# Patient Record
Sex: Female | Born: 1960 | Race: Black or African American | Hispanic: No | Marital: Single | Smoking: Never smoker
Health system: Southern US, Community
[De-identification: ages and names within clinical notes are randomized; demographics above are authoritative.]

## PROBLEM LIST (undated history)

## (undated) DIAGNOSIS — D649 Anemia, unspecified: Secondary | ICD-10-CM

## (undated) DIAGNOSIS — T7840XA Allergy, unspecified, initial encounter: Secondary | ICD-10-CM

## (undated) DIAGNOSIS — M109 Gout, unspecified: Secondary | ICD-10-CM

## (undated) DIAGNOSIS — R609 Edema, unspecified: Secondary | ICD-10-CM

## (undated) DIAGNOSIS — M2550 Pain in unspecified joint: Secondary | ICD-10-CM

## (undated) DIAGNOSIS — R011 Cardiac murmur, unspecified: Secondary | ICD-10-CM

## (undated) DIAGNOSIS — I10 Essential (primary) hypertension: Secondary | ICD-10-CM

## (undated) HISTORY — DX: Cardiac murmur, unspecified: R01.1

## (undated) HISTORY — DX: Anemia, unspecified: D64.9

## (undated) HISTORY — DX: Gout, unspecified: M10.9

## (undated) HISTORY — DX: Essential (primary) hypertension: I10

## (undated) HISTORY — PX: OVARIAN CYST REMOVAL: SHX89

## (undated) HISTORY — DX: Edema, unspecified: R60.9

## (undated) HISTORY — DX: Allergy, unspecified, initial encounter: T78.40XA

## (undated) HISTORY — PX: TUBAL LIGATION: SHX77

## (undated) HISTORY — DX: Pain in unspecified joint: M25.50

---

## 2011-10-03 DIAGNOSIS — E876 Hypokalemia: Secondary | ICD-10-CM

## 2011-10-03 HISTORY — DX: Hypokalemia: E87.6

## 2011-10-03 HISTORY — DX: Morbid (severe) obesity due to excess calories: E66.01

## 2011-10-08 DIAGNOSIS — D649 Anemia, unspecified: Secondary | ICD-10-CM | POA: Insufficient documentation

## 2011-11-09 ENCOUNTER — Other Ambulatory Visit: Payer: Self-pay | Admitting: Family Medicine

## 2011-11-09 DIAGNOSIS — Z1231 Encounter for screening mammogram for malignant neoplasm of breast: Secondary | ICD-10-CM

## 2011-11-12 DIAGNOSIS — E559 Vitamin D deficiency, unspecified: Secondary | ICD-10-CM

## 2011-11-12 HISTORY — DX: Vitamin D deficiency, unspecified: E55.9

## 2011-11-28 ENCOUNTER — Other Ambulatory Visit: Payer: Self-pay

## 2011-11-28 ENCOUNTER — Ambulatory Visit: Payer: Self-pay

## 2012-04-02 ENCOUNTER — Ambulatory Visit (INDEPENDENT_AMBULATORY_CARE_PROVIDER_SITE_OTHER): Payer: BC Managed Care – PPO | Admitting: Family Medicine

## 2012-04-02 ENCOUNTER — Ambulatory Visit: Payer: BC Managed Care – PPO

## 2012-04-02 VITALS — BP 148/96 | HR 59 | Temp 98.0°F | Resp 16 | Ht 65.0 in | Wt 239.6 lb

## 2012-04-02 DIAGNOSIS — M79673 Pain in unspecified foot: Secondary | ICD-10-CM

## 2012-04-02 DIAGNOSIS — M25579 Pain in unspecified ankle and joints of unspecified foot: Secondary | ICD-10-CM

## 2012-04-02 DIAGNOSIS — M542 Cervicalgia: Secondary | ICD-10-CM

## 2012-04-02 DIAGNOSIS — M79609 Pain in unspecified limb: Secondary | ICD-10-CM

## 2012-04-02 MED ORDER — TRAMADOL HCL 50 MG PO TABS: 50.0000 mg | ORAL_TABLET | Freq: Four times a day (QID) | ORAL | Status: AC | PRN

## 2012-04-02 MED ORDER — CYCLOBENZAPRINE HCL 5 MG PO TABS: ORAL_TABLET | ORAL | Status: AC
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
## 2012-04-02 NOTE — Patient Instructions (Signed)
Post op shoe as needed for next 1-2 weeks.  Recheck if foot and neck not improving.   Return to the clinic or go to the nearest emergency room if any of your symptoms worsen or new symptoms occur.

## 2012-06-24 ENCOUNTER — Ambulatory Visit (INDEPENDENT_AMBULATORY_CARE_PROVIDER_SITE_OTHER): Payer: BC Managed Care – PPO | Admitting: Family Medicine

## 2012-06-24 VITALS — BP 140/90 | HR 57 | Temp 98.5°F | Resp 16 | Ht 65.0 in | Wt 236.2 lb

## 2012-06-24 DIAGNOSIS — L259 Unspecified contact dermatitis, unspecified cause: Secondary | ICD-10-CM

## 2012-06-24 MED ORDER — TRIAMCINOLONE ACETONIDE 0.1 % EX CREA: TOPICAL_CREAM | Freq: Three times a day (TID) | CUTANEOUS | Status: DC

## 2012-06-24 MED ORDER — METHYLPREDNISOLONE ACETATE 80 MG/ML IJ SUSP
80.0000 mg | Freq: Once | INTRAMUSCULAR | Status: AC
  Administered 2012-06-24: 80 mg via INTRAMUSCULAR

## 2012-06-24 NOTE — Patient Instructions (Signed)
Use the cream 2-3 times a day on the rash.  Wash well with plenty of soap and water.  Take Zyrtec once or twice daily for the itching. It is much longer lasting than the Benadryl.

## 2012-06-24 NOTE — Progress Notes (Signed)
Subjective: 51 year old lady with history of having had a peppers yesterday. There relief chief the bag, and she wonders if it could have been poison ivy. Today she is broken out around her neck with a itching burning rash.   Objective: Maculopapular erythematous dermatitis around her neck. None elsewhere.  Assessment: Contact dermatitis  Plan: Depo-Medrol 80 IM Triamcinolone cream 3 times a day

## 2012-07-07 ENCOUNTER — Telehealth: Payer: Self-pay | Admitting: Radiology

## 2012-07-07 ENCOUNTER — Ambulatory Visit (INDEPENDENT_AMBULATORY_CARE_PROVIDER_SITE_OTHER): Payer: BC Managed Care – PPO | Admitting: Family Medicine

## 2012-07-07 VITALS — BP 164/92 | HR 73 | Temp 98.1°F | Resp 18 | Ht 64.75 in | Wt 235.0 lb

## 2012-07-07 DIAGNOSIS — L259 Unspecified contact dermatitis, unspecified cause: Secondary | ICD-10-CM

## 2012-07-07 DIAGNOSIS — I10 Essential (primary) hypertension: Secondary | ICD-10-CM

## 2012-07-07 DIAGNOSIS — Z131 Encounter for screening for diabetes mellitus: Secondary | ICD-10-CM

## 2012-07-07 LAB — GLUCOSE, POCT (MANUAL RESULT ENTRY): POC Glucose: 96 mg/dl (ref 70–99)

## 2012-07-07 MED ORDER — TRIAMCINOLONE ACETONIDE 0.1 % EX CREA: TOPICAL_CREAM | Freq: Three times a day (TID) | CUTANEOUS | Status: AC | PRN

## 2012-07-07 MED ORDER — PREDNISONE 20 MG PO TABS: ORAL_TABLET | ORAL | Status: DC

## 2012-07-07 MED ORDER — DIPHENHYDRAMINE HCL 25 MG PO CAPS: 25.0000 mg | ORAL_CAPSULE | Freq: Once | ORAL | Status: DC

## 2012-07-07 MED ORDER — DIPHENHYDRAMINE HCL 25 MG PO CAPS
25.0000 mg | ORAL_CAPSULE | Freq: Once | ORAL | Status: AC
  Administered 2012-07-07: 25 mg via ORAL

## 2012-07-07 NOTE — Telephone Encounter (Signed)
I have left message for patient to call, before she gets the prednisone Dr Neva Seat wants to make sure she has had her glucose level checked recently, if not she needs to come back here for this. She must have this done prior to starting the prednisone. The prednisone Rx is at [facility] desk and Dr Neva Seat does not want it sent in until we know about stats of this.

## 2012-07-07 NOTE — Addendum Note (Signed)
Addended by: Fernande Bras on: 07/07/2012 07:05 PM   Modules accepted: Orders

## 2012-07-07 NOTE — Patient Instructions (Signed)
Start prednisone.  Can take benadryl tonight (you were given one of these in the office today), but as itching improves - can change to zyrtec - one per day.  Ok to use steroid cream on areas next 1-2 days, but should not need this once the pills start working. If not improving in 3-4 days, can recheck in office.  Keep a record of your blood pressures outside of the office and bring them to the next office visit. Follow up soon if blood pressure remains over 140/90. Marland Kitchen Return to the clinic or go to the nearest emergency room if any of your symptoms worsen or new symptoms occur.     Contact Dermatitis Contact dermatitis is a reaction to certain substances that touch the skin. Contact dermatitis can be either irritant contact dermatitis or allergic contact dermatitis. Irritant contact dermatitis does not require previous exposure to the substance for a reaction to occur.Allergic contact dermatitis only occurs if you have been exposed to the substance before. Upon a repeat exposure, your body reacts to the substance.  CAUSES  Many substances can cause contact dermatitis. Irritant dermatitis is most commonly caused by repeated exposure to mildly irritating substances, such as:  Makeup.   Soaps.   Detergents.   Bleaches.   Acids.   Metal salts, such as nickel.  Allergic contact dermatitis is most commonly caused by exposure to:  Poisonous plants.   Chemicals (deodorants, shampoos).   Jewelry.   Latex.   Neomycin in triple antibiotic cream.   Preservatives in products, including clothing.  SYMPTOMS  The area of skin that is exposed may develop:  Dryness or flaking.   Redness.   Cracks.   Itching.   Pain or a burning sensation.   Blisters.  With allergic contact dermatitis, there may also be swelling in areas such as the eyelids, mouth, or genitals.  DIAGNOSIS  Your caregiver can usually tell what the problem is by doing a physical exam. In cases where the cause is  uncertain and an allergic contact dermatitis is suspected, a patch skin test may be performed to help determine the cause of your dermatitis. TREATMENT Treatment includes protecting the skin from further contact with the irritating substance by avoiding that substance if possible. Barrier creams, powders, and gloves may be helpful. Your caregiver may also recommend:  Steroid creams or ointments applied 2 times daily. For best results, soak the rash area in cool water for 20 minutes. Then apply the medicine. Cover the area with a plastic wrap. You can store the steroid cream in the refrigerator for a "chilly" effect on your rash. That may decrease itching. Oral steroid medicines may be needed in more severe cases.   Antibiotics or antibacterial ointments if a skin infection is present.   Antihistamine lotion or an antihistamine taken by mouth to ease itching.   Lubricants to keep moisture in your skin.   Burow's solution to reduce redness and soreness or to dry a weeping rash. Mix one packet or tablet of solution in 2 cups cool water. Dip a clean washcloth in the mixture, wring it out a bit, and put it on the affected area. Leave the cloth in place for 30 minutes. Do this as often as possible throughout the day.   Taking several cornstarch or baking soda baths daily if the area is too large to cover with a washcloth.  Harsh chemicals, such as alkalis or acids, can cause skin damage that is like a burn. You should flush your  skin for 15 to 20 minutes with cold water after such an exposure. You should also seek immediate medical care after exposure. Bandages (dressings), antibiotics, and pain medicine may be needed for severely irritated skin.  HOME CARE INSTRUCTIONS  Avoid the substance that caused your reaction.   Keep the area of skin that is affected away from hot water, soap, sunlight, chemicals, acidic substances, or anything else that would irritate your skin.   Do not scratch the rash.  Scratching may cause the rash to become infected.   You may take cool baths to help stop the itching.   Only take over-the-counter or prescription medicines as directed by your caregiver.   See your caregiver for follow-up care as directed to make sure your skin is healing properly.  SEEK MEDICAL CARE IF:   Your condition is not better after 3 days of treatment.   You seem to be getting worse.   You see signs of infection such as swelling, tenderness, redness, soreness, or warmth in the affected area.   You have any problems related to your medicines.  Document Released: 10/26/2000 Document Revised: 10/18/2011 Document Reviewed: 04/03/2011 [facility] Patient Information 2012 [place], [place].

## 2012-07-07 NOTE — Telephone Encounter (Signed)
Patient returned, unhappy that her prednisone was not at the pharmacy (she waited there for an hour and had not received our message).  I gave her the prescription, and then received this message.  She came in for the test.  Glucose 93.

## 2012-07-07 NOTE — Progress Notes (Signed)
Subjective:    Patient ID: Candice [name], female    DOB: October 04, 1961, 51 y.o.   MRN: [id number]  HPI Candice [name] is a 51 y.o. female Treated 06/24/12 with depomedrol 80mg  IM, and tac cream tid for contact dermatitis to neck. Front of neck then -resolved, but then rash on shoulders (now clear), then face - past few days.  Itches by eyes. Also on lower arms. Very itchy.   Picked up leaf when had green peppers at work prior to last office visit - unknown leaf, but rash started that night.   Tx: tac cream - 2-3 times per day.  Helped when using on shoulders - ran out of cream last night.  Calamine cream - last night.   No fevers. Otherwise feels ok.  No new meds/supplements/soaps/lotions.  No oral or mucosal lesions.   Review of Systems  Constitutional: Negative for fever and chills.  Respiratory: Negative for chest tightness and shortness of breath.   Skin: Positive for rash. Negative for wound.  Neurological: Negative for dizziness, facial asymmetry, weakness, light-headedness and headaches.        Objective:   Physical Exam  Constitutional: She is oriented to person, place, and time. She appears well-developed and well-nourished.  HENT:  Head: Normocephalic and atraumatic.  Eyes: EOM are normal. Pupils are equal, round, and reactive to light.  Cardiovascular: Normal rate, regular rhythm and normal pulses.  PMI is not displaced.   Murmur heard.  Systolic murmur is present with a grade of 2/6       Chronic murmur since childhood.   Neurological: She is alert and oriented to person, place, and time.  Skin: Skin is warm and dry. Rash noted. Rash is maculopapular.          Confluent macalopapular rash - forearms, few scattered areas on upper arms and malar prominences/cheeks bilaterally.         Assessment & Plan:  Candice [name] is a 51 y.o. female 1. Contact dermatitis  diphenhydrAMINE (BENADRYL) capsule 25 mg, triamcinolone cream (KENALOG) 0.1 %, predniSONE (DELTASONE)  20 MG tablet, DISCONTINUED: diphenhydrAMINE (BENADRYL) capsule 25 mg  2. HTN (hypertension)     Contact derm- recurrent.  Rhus possible with spread - now on face. Prednisone taper. SED, rtc if not improving.   HTN - cehck outside BP's and follow up with primary provider.     Patient Instructions  Start prednisone.  Can take benadryl tonight (you were given one of these in the office today), but as itching improves - can change to zyrtec - one per day.  Ok to use steroid cream on areas next 1-2 days, but should not need this once the pills start working. If not improving in 3-4 days, can recheck in office.  Keep a record of your blood pressures outside of the office and bring them to the next office visit. Follow up soon if blood pressure remains over 140/90. Marland Kitchen Return to the clinic or go to the nearest emergency room if any of your symptoms worsen or new symptoms occur.     Contact Dermatitis Contact dermatitis is a reaction to certain substances that touch the skin. Contact dermatitis can be either irritant contact dermatitis or allergic contact dermatitis. Irritant contact dermatitis does not require previous exposure to the substance for a reaction to occur.Allergic contact dermatitis only occurs if you have been exposed to the substance before. Upon a repeat exposure, your body reacts to the substance.  CAUSES  Many substances can  cause contact dermatitis. Irritant dermatitis is most commonly caused by repeated exposure to mildly irritating substances, such as:  Makeup.   Soaps.   Detergents.   Bleaches.   Acids.   Metal salts, such as nickel.  Allergic contact dermatitis is most commonly caused by exposure to:  Poisonous plants.   Chemicals (deodorants, shampoos).   Jewelry.   Latex.   Neomycin in triple antibiotic cream.   Preservatives in products, including clothing.  SYMPTOMS  The area of skin that is exposed may develop:  Dryness or flaking.   Redness.    Cracks.   Itching.   Pain or a burning sensation.   Blisters.  With allergic contact dermatitis, there may also be swelling in areas such as the eyelids, mouth, or genitals.  DIAGNOSIS  Your caregiver can usually tell what the problem is by doing a physical exam. In cases where the cause is uncertain and an allergic contact dermatitis is suspected, a patch skin test may be performed to help determine the cause of your dermatitis. TREATMENT Treatment includes protecting the skin from further contact with the irritating substance by avoiding that substance if possible. Barrier creams, powders, and gloves may be helpful. Your caregiver may also recommend:  Steroid creams or ointments applied 2 times daily. For best results, soak the rash area in cool water for 20 minutes. Then apply the medicine. Cover the area with a plastic wrap. You can store the steroid cream in the refrigerator for a "chilly" effect on your rash. That may decrease itching. Oral steroid medicines may be needed in more severe cases.   Antibiotics or antibacterial ointments if a skin infection is present.   Antihistamine lotion or an antihistamine taken by mouth to ease itching.   Lubricants to keep moisture in your skin.   Burow's solution to reduce redness and soreness or to dry a weeping rash. Mix one packet or tablet of solution in 2 cups cool water. Dip a clean washcloth in the mixture, wring it out a bit, and put it on the affected area. Leave the cloth in place for 30 minutes. Do this as often as possible throughout the day.   Taking several cornstarch or baking soda baths daily if the area is too large to cover with a washcloth.  Harsh chemicals, such as alkalis or acids, can cause skin damage that is like a burn. You should flush your skin for 15 to 20 minutes with cold water after such an exposure. You should also seek immediate medical care after exposure. Bandages (dressings), antibiotics, and pain medicine may  be needed for severely irritated skin.  HOME CARE INSTRUCTIONS  Avoid the substance that caused your reaction.   Keep the area of skin that is affected away from hot water, soap, sunlight, chemicals, acidic substances, or anything else that would irritate your skin.   Do not scratch the rash. Scratching may cause the rash to become infected.   You may take cool baths to help stop the itching.   Only take over-the-counter or prescription medicines as directed by your caregiver.   See your caregiver for follow-up care as directed to make sure your skin is healing properly.  SEEK MEDICAL CARE IF:   Your condition is not better after 3 days of treatment.   You seem to be getting worse.   You see signs of infection such as swelling, tenderness, redness, soreness, or warmth in the affected area.   You have any problems related to your medicines.  Document Released: 10/26/2000 Document Revised: 10/18/2011 Document Reviewed: 04/03/2011 [facility] Patient Information 2012 [place], [place].

## 2012-07-08 NOTE — Telephone Encounter (Signed)
Thank you :)

## 2012-07-16 ENCOUNTER — Other Ambulatory Visit: Payer: Self-pay | Admitting: Physician Assistant

## 2012-07-16 ENCOUNTER — Ambulatory Visit (INDEPENDENT_AMBULATORY_CARE_PROVIDER_SITE_OTHER): Payer: BC Managed Care – PPO | Admitting: Family Medicine

## 2012-07-16 VITALS — BP 136/88 | HR 74 | Temp 98.4°F | Resp 16 | Ht 65.0 in | Wt 231.8 lb

## 2012-07-16 DIAGNOSIS — L259 Unspecified contact dermatitis, unspecified cause: Secondary | ICD-10-CM

## 2012-07-16 HISTORY — DX: Unspecified contact dermatitis, unspecified cause: L25.9

## 2012-07-16 MED ORDER — HYDROXYZINE HCL 25 MG PO TABS: 25.0000 mg | ORAL_TABLET | Freq: Three times a day (TID) | ORAL | Status: AC | PRN

## 2012-07-16 MED ORDER — PREDNISONE 20 MG PO TABS: ORAL_TABLET | ORAL | Status: AC

## 2012-07-16 MED ORDER — PREDNISONE 20 MG PO TABS: ORAL_TABLET | ORAL | Status: DC

## 2012-07-16 NOTE — Progress Notes (Signed)
  Subjective:    Patient ID: Candice [name], female    DOB: Dec 09, 1960, 51 y.o.   MRN: [id number]  HPI    Review of Systems     Objective:   Physical Exam        Assessment & Plan:   Subjective:    Patient ID: Candice [name], female    DOB: [birth year], 51 y.o.   MRN: [id number]  HPI Candice [name] is a 51 y.o. female Treated 06/24/12 with depomedrol 80mg  IM, and tac cream tid for contact dermatitis to neck. Front of neck then -resolved, but then rash on shoulders (now clear), then face - past few days.  Itches by eyes. Also on lower arms. Very itchy. Patient finished her last prednisone dose not too long ago. Patient had improvement but then course of the last 2 days having a worsening.  No fever or chills. Otherwise feels ok.  No new meds/supplements/soaps/lotions.  No oral or mucosal lesions. No else in the family has either.  Review of Systems  Constitutional: Negative for fever and chills.  Respiratory: Negative for chest tightness and shortness of breath.   Skin: Positive for rash. Negative for wound.  Neurological: Negative for dizziness, facial asymmetry, weakness, light-headedness and headaches.        Objective:   Physical Exam  Filed Vitals:   07/16/12 1952  BP: 136/88  Pulse: 74  Temp: 98.4 F (36.9 C)  Resp: 16    Constitutional: She is oriented to person, place, and time. She appears well-developed and well-nourished. .  Cardiovascular: Normal rate, regular rhythm and normal pulses.  PMI is not displaced.   Murmur heard.  Systolic murmur is present with a grade of 2/6       Chronic murmur since childhood.   Neurological: She is alert and oriented to person, place, and time.  Skin: Skin is warm and dry. Rash noted. Rash is maculopapular.          Confluent macalopapular rash - forearms, few scattered areas on upper arms and malar prominences/cheeks bilaterally.       Assessment & Plan:  Candice [name] is a 51 y.o. female 1. Contact dermatitis    another prednisone burst. Concerned for potential systemic illness as well. If patient has problems again we'll consider referral to dermatology.    Contact derm- recurrent.  Rhus possible with spread - now on face. Prednisone taper ( again with longer duration. SED, rtc if not improving. If return would consider referral to dermatology.

## 2012-07-16 NOTE — Patient Instructions (Signed)
I'm so sorry you're having this again. I'm going to give you prednisone again with a longer taper. I'm also giving you hydroxyzine to help with itching. You can take this up to 3 times a day. If this continues I would consider this a potential sun allergy we may need to consider different treatment options.

## 2012-07-16 NOTE — Progress Notes (Deleted)
  Subjective:    Patient ID: Candice [name], female    DOB: 09/29/1961, 51 y.o.   MRN: [id number]  HPI    Review of Systems     Objective:   Physical Exam        Assessment & Plan:

## 2012-07-16 NOTE — Assessment & Plan Note (Signed)
  Contact derm- recurrent.  Rhus possible with spread - now on face. Prednisone taper ( again with longer duration. SED, rtc if not improving. If return would consider referral to dermatology.

## 2012-08-13 ENCOUNTER — Encounter: Payer: Self-pay | Admitting: Family Medicine

## 2012-08-13 ENCOUNTER — Telehealth: Payer: Self-pay

## 2012-08-13 DIAGNOSIS — R21 Rash and other nonspecific skin eruption: Secondary | ICD-10-CM

## 2012-08-13 NOTE — Progress Notes (Signed)
History and physical reviewed.  Agree with assessment and plan.  If persists, will warrant dermatology referral; may also warrant allergy testing.  KMS

## 2012-08-13 NOTE — Telephone Encounter (Signed)
Referral made 

## 2012-08-13 NOTE — Telephone Encounter (Signed)
LMOM to notify patient that referral was made and that referral dept would get in contact with her as soon as they can get arrangements made.

## 2012-08-13 NOTE — Telephone Encounter (Signed)
Pt has been here recently for some skin irritations would like to know if we could refer her to a dermatologist.

## 2012-08-15 NOTE — Progress Notes (Signed)
Reviewed and agree.

## 2013-02-04 ENCOUNTER — Other Ambulatory Visit: Payer: Self-pay | Admitting: Family Medicine

## 2013-02-05 ENCOUNTER — Ambulatory Visit (INDEPENDENT_AMBULATORY_CARE_PROVIDER_SITE_OTHER): Payer: BC Managed Care – PPO | Admitting: Family Medicine

## 2013-02-05 ENCOUNTER — Telehealth: Payer: Self-pay

## 2013-02-05 VITALS — BP 98/80 | HR 60 | Temp 98.3°F | Resp 16 | Ht 65.0 in | Wt 224.0 lb

## 2013-02-05 DIAGNOSIS — L259 Unspecified contact dermatitis, unspecified cause: Secondary | ICD-10-CM

## 2013-02-05 DIAGNOSIS — L282 Other prurigo: Secondary | ICD-10-CM

## 2013-02-05 DIAGNOSIS — L309 Dermatitis, unspecified: Secondary | ICD-10-CM

## 2013-02-05 MED ORDER — BETAMETHASONE DIPROPIONATE 0.05 % EX CREA: TOPICAL_CREAM | Freq: Two times a day (BID) | CUTANEOUS | Status: DC

## 2013-02-05 MED ORDER — HYDROXYZINE PAMOATE 25 MG PO CAPS: ORAL_CAPSULE | ORAL | Status: DC

## 2013-02-05 MED ORDER — METHYLPREDNISOLONE ACETATE 80 MG/ML IJ SUSP
80.0000 mg | Freq: Once | INTRAMUSCULAR | Status: AC
  Administered 2013-02-05: 80 mg via INTRAMUSCULAR

## 2013-02-05 NOTE — Progress Notes (Signed)
   [address]   [place], [place]  [address]   [phone]  Subjective:    Patient ID: Candice [name], female    DOB: 03-09-1961, 52 y.o.   MRN: [id number]  HPI This 52 y.o. female presents for evaluation of      Review of Systems     Objective:   Physical Exam        Assessment & Plan:

## 2013-02-05 NOTE — Progress Notes (Signed)
Subjective: Patient was here for a rash. She was seen several times last summer and fall for the same rash. Also saw the dermatologist. The rash appeared to be an eczematoid rash around the neck suspicious for a contact dermatitis. However it kept coming back. She picked up a little pepper leaf one of the previous times. This time she had handled pepper before this broken out,  it is continued to itch badly especially at nighttime. She is a Psychologist, forensic.  Objective: Eczematoid dermatitis in a band around the base of her neck just above the clavicle. Did not see any rashes elsewhere on her skin.  Assessment: Eczematoid dermatitis  Plan: Depo-Medrol 80 Betamethasone cream Vistaril 50 mg at bedtime If not doing better we'll need to do a punch biopsy of this.  The other night was started by Dr. Katrinka Blazing, but the patient was switched off to me because of me having seen the rash in the past.

## 2013-02-05 NOTE — Telephone Encounter (Signed)
PATIENT CALLED TO GET A REFILL: triamcinolone cream (KENALOG) 0.1 % AND SAYS THAT SHE RECIEVED A MISSED CALL FROM OUR OFFICE. SHE WANTS TO KNOW IF SHE NEEDS AN OFFICE VISIT OR IF IT CAN BE REFILLED. BEST NUMBER: [id number]

## 2013-02-05 NOTE — Telephone Encounter (Signed)
LMOM to CB. Has contact dermatitis returned? Dr Katrinka Blazing suggested referral to dermatologist if returned. Does pt want to pursue referral?

## 2013-02-05 NOTE — Telephone Encounter (Signed)
Pt here and being seen by Dr. Katrinka Blazing

## 2013-02-05 NOTE — Patient Instructions (Addendum)
Use the cream twice daily on the rash as needed  Take the hydroxyzine that the dermatologist prescribed, 10 mg, in the daytime if needed for itching  Take the hydroxyzine 25 mg one or 2 tablets at bedtime as needed for itching  If the rash is not doing better return and I will probably do a little punch biopsy of it.

## 2013-02-22 ENCOUNTER — Ambulatory Visit (INDEPENDENT_AMBULATORY_CARE_PROVIDER_SITE_OTHER): Payer: BC Managed Care – PPO | Admitting: Family Medicine

## 2013-02-22 DIAGNOSIS — T50905S Adverse effect of unspecified drugs, medicaments and biological substances, sequela: Secondary | ICD-10-CM

## 2013-02-22 DIAGNOSIS — I10 Essential (primary) hypertension: Secondary | ICD-10-CM

## 2013-02-22 MED ORDER — AMLODIPINE BESYLATE 5 MG PO TABS: 5.0000 mg | ORAL_TABLET | Freq: Every day | ORAL | Status: DC

## 2013-02-22 MED ORDER — PREDNISONE 20 MG PO TABS: ORAL_TABLET | ORAL | Status: DC

## 2013-02-22 NOTE — Progress Notes (Signed)
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  Each time, she has had an itchy red rash on chest and or upper arms.  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

## 2013-04-26 ENCOUNTER — Ambulatory Visit (INDEPENDENT_AMBULATORY_CARE_PROVIDER_SITE_OTHER): Payer: BC Managed Care – PPO | Admitting: Family Medicine

## 2013-04-26 VITALS — BP 148/100 | HR 65 | Temp 98.5°F | Resp 16 | Ht 65.0 in | Wt 212.8 lb

## 2013-04-26 DIAGNOSIS — T7840XA Allergy, unspecified, initial encounter: Secondary | ICD-10-CM

## 2013-04-26 DIAGNOSIS — L500 Allergic urticaria: Secondary | ICD-10-CM

## 2013-04-26 DIAGNOSIS — I10 Essential (primary) hypertension: Secondary | ICD-10-CM

## 2013-04-26 MED ORDER — METHYLPREDNISOLONE ACETATE 80 MG/ML IJ SUSP: 80.0000 mg | Freq: Once | INTRAMUSCULAR | Status: DC

## 2013-04-26 MED ORDER — ATENOLOL-CHLORTHALIDONE 50-25 MG PO TABS: 0.5000 | ORAL_TABLET | Freq: Every day | ORAL | Status: DC

## 2013-04-26 MED ORDER — METHYLPREDNISOLONE ACETATE 80 MG/ML IJ SUSP
80.0000 mg | Freq: Once | INTRAMUSCULAR | Status: AC
  Administered 2013-04-26: 80 mg via INTRAMUSCULAR

## 2013-04-26 NOTE — Patient Instructions (Addendum)
You can continue to take the levocetirizine once per day for allergy, hydroxyzine if needed for itching, and we will refer you to an allergist. Avoid any peppers or chilies for now. Return to the clinic or go to the nearest emergency room if any of your symptoms worsen or new symptoms occur. Keep a record of your blood pressures outside of the office and bring them to the next office visit in the next 6 weeks. Stop amlodipine and restart the atenolol/chlorthalidone as you were taking prior.

## 2013-04-26 NOTE — Progress Notes (Signed)
Subjective:    Patient ID: Candice [name], female    DOB: [birth year], 52 y.o.   MRN: [id number]  HPI Candice [name] is a 52 y.o. female Skin irritation - see 06/2012 - thought possible poison ivy - treated with prednisone taper.    Similar sx's another time after eating peppers or suspected contact with peppers - in September of last year, March and April of this year - see other notes.  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 Treated with prednisone prior. Dermatologist treated with some kind of cream - unknown diagnosis.   Current rash past 3 days. Did eat some red peppers in collard greens at lunch earlier that day.  Noticed rash on upper arms again that night.  Still on upper arms only.  No dyspnea, no difficulty with breathing or swallowing.   Tx: TAC cream - 3 times yesterday, none yet today.   HTN - No missed doses of BP meds. Changed from atenolol/chlorthalidone to amlodipine. Feels like prior medicine worked better,   Review of Systems  Constitutional: Negative for fever and chills.  HENT: Negative for mouth sores and trouble swallowing.   Respiratory: Negative for shortness of breath.   Genitourinary: Negative for vaginal pain.  Neurological: Negative for dizziness and light-headedness.       Objective:   Physical Exam  Vitals reviewed. Constitutional: She is oriented to person, place, and time. She appears well-developed and well-nourished. No distress.  HENT:  Head: Normocephalic and atraumatic.  Mouth/Throat: Oropharynx is clear and moist.  Eyes: Conjunctivae and EOM are normal. Pupils are equal, round, and reactive to light.  Neck: Carotid bruit is not present.  Cardiovascular: Normal rate, regular rhythm, normal heart sounds and intact distal pulses.   Pulmonary/Chest: Effort normal and breath sounds normal.  Abdominal: Soft. She exhibits no pulsatile midline mass. There is no tenderness.  Neurological: She is alert and  oriented to person, place, and time.  Skin: Skin is warm, dry and intact. Rash noted. Rash is urticarial. There is erythema.     Psychiatric: She has a normal mood and affect. Her behavior is normal.      Assessment & Plan:  [name] is a 52 y.o. female Allergic reaction, initial encounter -Allergic urticaria - Plan: methylPREDNISolone acetate (DEPO-MEDROL) injection 80 mg, Ambulatory referral to Allergy, continue hydroxyzine as needed, and Xyzal. Refer to allergist, but rtc precautions discussed. Avoidance of possible food triggers discussed.   HTN (hypertension) - controlled prior by report on prior regimen. Stop amlodipine. Restart atenolol-chlorthalidone (TENORETIC) 50-25 MG per tablet,- 1/2 qd. Keep record of outside BP's, and recheck in next 6 weeks.   Meds ordered this encounter  . atenolol-chlorthalidone (TENORETIC) 50-25 MG per tablet    Sig: Take 0.5 tablets by mouth daily.    Dispense:  30 tablet    Refill:  1  . methylPREDNISolone acetate (DEPO-MEDROL) injection 80 mg    Sig:    Patient Instructions  You can continue to take the levocetirizine once per day for allergy, hydroxyzine if needed for itching, and we will refer you to an allergist. Avoid any peppers or chilies for now. Return to the clinic or go to the nearest emergency room if any of your symptoms worsen or new symptoms occur. Keep a record of your blood pressures outside of the office and bring them to the next office visit in the next 6 weeks. Stop amlodipine and restart the atenolol/chlorthalidone as you were taking prior.

## 2013-06-23 ENCOUNTER — Encounter: Payer: Self-pay | Admitting: Radiology

## 2013-06-23 DIAGNOSIS — L500 Allergic urticaria: Secondary | ICD-10-CM | POA: Insufficient documentation

## 2013-06-23 HISTORY — DX: Allergic urticaria: L50.0

## 2013-08-19 ENCOUNTER — Ambulatory Visit (INDEPENDENT_AMBULATORY_CARE_PROVIDER_SITE_OTHER): Payer: BC Managed Care – PPO | Admitting: Family Medicine

## 2013-08-19 VITALS — BP 176/116 | HR 58 | Temp 98.7°F | Resp 16 | Ht 66.0 in | Wt 214.4 lb

## 2013-08-19 DIAGNOSIS — M109 Gout, unspecified: Secondary | ICD-10-CM | POA: Insufficient documentation

## 2013-08-19 DIAGNOSIS — M79671 Pain in right foot: Secondary | ICD-10-CM

## 2013-08-19 DIAGNOSIS — I10 Essential (primary) hypertension: Secondary | ICD-10-CM | POA: Insufficient documentation

## 2013-08-19 DIAGNOSIS — W57XXXA Bitten or stung by nonvenomous insect and other nonvenomous arthropods, initial encounter: Secondary | ICD-10-CM

## 2013-08-19 DIAGNOSIS — M7989 Other specified soft tissue disorders: Secondary | ICD-10-CM

## 2013-08-19 DIAGNOSIS — M79609 Pain in unspecified limb: Secondary | ICD-10-CM

## 2013-08-19 HISTORY — DX: Essential (primary) hypertension: I10

## 2013-08-19 MED ORDER — COLCHICINE 0.6 MG PO TABS: ORAL_TABLET | ORAL | Status: DC

## 2013-08-19 MED ORDER — ATENOLOL-CHLORTHALIDONE 50-25 MG PO TABS: 0.5000 | ORAL_TABLET | Freq: Every day | ORAL | Status: DC

## 2013-08-19 MED ORDER — MUPIROCIN 2 % EX OINT: TOPICAL_OINTMENT | Freq: Three times a day (TID) | CUTANEOUS | Status: DC

## 2013-08-19 MED ORDER — TRAMADOL HCL 50 MG PO TABS: 50.0000 mg | ORAL_TABLET | Freq: Four times a day (QID) | ORAL | Status: DC | PRN

## 2013-08-19 NOTE — Progress Notes (Addendum)
Subjective:    Patient ID: Candice [name], female    DOB: [birth year], 52 y.o.   MRN: [id number]  This chart was scribed for Meredith Staggers, MD by Greggory Stallion, ED Scribe. This patient's care was started at 7:39 PM.  HPI HPI Comments: Candice [name] is a 52 y.o. female who presents to the office complaining of gradual onset, constant right great toe pain that started 3 days ago. She denies injury. Pt started wearing a post op shoe today with little relief. She has also done warm soaks, used Tramadol and alcohol with some relief. She states touch worsens the pain. Pt had similar pain in December but never had it checked out because it went away after 2-3 days.   She states she also has an insect bit on her left calf that she noticed 1 week ago. She states she thinks it is a spider bite. Pt has had discharge of pus from the site, but dry past 2 days.   She states she has missed her HTN medication yesterday put took a pill about 20 minutes ago. Pt takes tenoretic 50-25 mg - 1/2 qd. She denies CP, difficulty breathing, headaches, lightheadedness and difficulty urinating.   Past Medical History  Diagnosis Date  . Hypertension   . Heart murmur   . Allergy    Past Surgical History  Procedure Laterality Date  . Ovarian cyst removal Left     20 yrs ago  . Tubal ligation     History   Social History  . Marital Status: Divorced    Spouse Name: N/A    Number of Children: N/A  . Years of Education: N/A   Occupational History  . Not on file.   Social History Main Topics  . Smoking status: Never Smoker   . Smokeless tobacco: Not on file  . Alcohol Use: No  . Drug Use: No  . Sexual Activity: Yes    Birth Control/ Protection: Surgical   Other Topics Concern  . Not on file   Social History Narrative  . No narrative on file   Allergies  Allergen Reactions  . Ace Inhibitors Nausea And Vomiting   Prior to Admission medications   Medication Sig Start Date End Date Taking?  Authorizing Provider  atenolol-chlorthalidone (TENORETIC) 50-25 MG per tablet Take 0.5 tablets by mouth daily. 04/26/13  Yes Shade Flood, MD  traMADol (ULTRAM) 50 MG tablet Take 50 mg by mouth every 6 (six) hours as needed for pain.   Yes Historical Provider, MD  levocetirizine (XYZAL) 5 MG tablet Take 5 mg by mouth every evening.    Historical Provider, MD   Filed Vitals:   08/19/13 1835  BP: 168/106  Pulse: 65  Temp: 98.7 F (37.1 C)  TempSrc: Oral  Resp: 16  Height: 5\' 6"  (1.676 m)  Weight: 214 lb 6.4 oz (97.251 kg)  SpO2: 99%    Review of Systems  Constitutional: Negative for fatigue and unexpected weight change.  Respiratory: Negative for chest tightness and shortness of breath.   Cardiovascular: Negative for chest pain, palpitations and leg swelling.  Gastrointestinal: Negative for abdominal pain and blood in stool.  Genitourinary: Negative for difficulty urinating.  Musculoskeletal: Positive for arthralgias.  Skin: Positive for wound (insect bite).  Neurological: Negative for dizziness, syncope, light-headedness and headaches.       Objective:   Physical Exam  Constitutional: She is oriented to person, place, and time. She appears well-developed and well-nourished. No distress.  HENT:  Head: Normocephalic and atraumatic.  Eyes: Conjunctivae and EOM are normal. Pupils are equal, round, and reactive to light.  Neck: Normal range of motion. Carotid bruit is not present.  Cardiovascular: Normal rate, regular rhythm, normal heart sounds and intact distal pulses.   Pulmonary/Chest: Effort normal and breath sounds normal. No respiratory distress.  Abdominal: Soft. She exhibits no pulsatile midline mass. There is no tenderness.  Musculoskeletal:       Right foot: She exhibits decreased range of motion, tenderness and swelling.       Feet:  Neurological: She is alert and oriented to person, place, and time.  Skin: Skin is warm and dry. She is not diaphoretic.  Left  posterior calf has 1 cm slightly scaled, indurated area with central dried scab. Minimal induration. No fluctuance. Approximately 5 mm surrounding hyperpigmentation without erythema or warmth.   Psychiatric: She has a normal mood and affect. Her behavior is normal.        Assessment & Plan:  Candice [name] is a 52 y.o. female Foot pain, right - Plan: Uric acid, Basic metabolic panel, traMADol (ULTRAM) 50 MG tablet, colchicine 0.6 MG, Toe swelling - Plan: Uric acid, Basic metabolic panel.  Suspected gout, with prior similar sx's now in hindsight. Check urica acid, trigger avoidance, ultram if needed for current flair, but some improvement today. Can take Colcrys 1.2mg  x 1 and discussed use for next flair and if frequent flairs - urate lowering therapy.   HTN (hypertension) - Plan: atenolol-chlorthalidone (TENORETIC) 50-25 MG per tablet - restart at 1/2 qd. Er/911 precautions discussed and compliance reasons and possible complications of med nonadherence discussed. See below.  Check BMP.   Plan on recheck in next 6 weeks if controlled, soner if numbers remain elevated.   Wound - leg.  Spider bite/insect bite vs folliculitis. Improving. bactroban if needed for next few days, but if any increase in redness, swelling, or discharge - rtc.   I personally performed the services described in this documentation, which was scribed in my presence. The recorded information has been reviewed and considered, and addended by me as needed.   Meds ordered this encounter           . traMADol (ULTRAM) 50 MG tablet    Sig: Take 1 tablet (50 mg total) by mouth every 6 (six) hours as needed for pain.    Dispense:  30 tablet    Refill:  0  . atenolol-chlorthalidone (TENORETIC) 50-25 MG per tablet    Sig: Take 0.5 tablets by mouth daily.    Dispense:  30 tablet    Refill:  1  . colchicine 0.6 MG tablet    Sig: 1-2 tabs at onset of toe pain. Take once per flair only.    Dispense:  30 tablet    Refill:  0  .  mupirocin ointment (BACTROBAN) 2 %    Sig: Apply topically 3 (three) times daily.    Dispense:  22 g    Refill:  0     Patient Instructions  Your big toe pain appears to be gout. Ok to wear postop shoe, tramadol if needed for toe pain, colchicine - 2 at onset of flair only, and other info below. You should receive a call or letter about your lab results within the next week to 10 days.   Restart blood pressure medicine - Keep a record of your blood pressures outside of the office and bring them to the next office visit in next 4-6  weeks, but if remaining over 140/90 in next week - return sooner. Return to the clinic or go to the nearest emergency room if any of your symptoms worsen or new symptoms occur.  The bump on your leg could be resolving skin infection or spider bite. Ok to apply prescription ointment 3 times per day for next week, but if any worsening - return to clinic.   Gout Gout is an inflammatory condition (arthritis) caused by a buildup of uric acid crystals in the joints. Uric acid is a chemical that is normally present in the blood. Under some circumstances, uric acid can form into crystals in your joints. This causes joint redness, soreness, and swelling (inflammation). Repeat attacks are common. Over time, uric acid crystals can form into masses (tophi) near a joint, causing disfigurement. Gout is treatable and often preventable. CAUSES  The disease begins with elevated levels of uric acid in the blood. Uric acid is produced by your body when it breaks down a naturally found substance called purines. This also happens when you eat certain foods such as meats and fish. Causes of an elevated uric acid level include:  Being passed down from parent to child (heredity).  Diseases that cause increased uric acid production (obesity, psoriasis, some cancers).  Excessive alcohol use.  Diet, especially diets rich in meat and seafood.  Medicines, including certain cancer-fighting  drugs (chemotherapy), diuretics, and aspirin.  Chronic kidney disease. The kidneys are no longer able to remove uric acid well.  Problems with metabolism. Conditions strongly associated with gout include:  Obesity.  High blood pressure.  High cholesterol.  Diabetes. Not everyone with elevated uric acid levels gets gout. It is not understood why some people get gout and others do not. Surgery, joint injury, and eating too much of certain foods are some of the factors that can lead to gout. SYMPTOMS   An attack of gout comes on quickly. It causes intense pain with redness, swelling, and warmth in a joint.  Fever can occur.  Often, only one joint is involved. Certain joints are more commonly involved:  Base of the big toe.  Knee.  Ankle.  Wrist.  Finger. Without treatment, an attack usually goes away in a few days to weeks. Between attacks, you usually will not have symptoms, which is different from many other forms of arthritis. DIAGNOSIS  Your caregiver will suspect gout based on your symptoms and exam. Removal of fluid from the joint (arthrocentesis) is done to check for uric acid crystals. Your caregiver will give you a medicine that numbs the area (local anesthetic) and use a needle to remove joint fluid for exam. Gout is confirmed when uric acid crystals are seen in joint fluid, using a special microscope. Sometimes, blood, urine, and X-ray tests are also used. TREATMENT  There are 2 phases to gout treatment: treating the sudden onset (acute) attack and preventing attacks (prophylaxis). Treatment of an Acute Attack  Medicines are used. These include anti-inflammatory medicines or steroid medicines.  An injection of steroid medicine into the affected joint is sometimes necessary.  The painful joint is rested. Movement can worsen the arthritis.  You may use warm or cold treatments on painful joints, depending which works best for you.  Discuss the use of coffee,  vitamin C, or cherries with your caregiver. These may be helpful treatment options. Treatment to Prevent Attacks After the acute attack subsides, your caregiver may advise prophylactic medicine. These medicines either help your kidneys eliminate uric acid from your  body or decrease your uric acid production. You may need to stay on these medicines for a very long time. The early phase of treatment with prophylactic medicine can be associated with an increase in acute gout attacks. For this reason, during the first few months of treatment, your caregiver may also advise you to take medicines usually used for acute gout treatment. Be sure you understand your caregiver's directions. You should also discuss dietary treatment with your caregiver. Certain foods such as meats and fish can increase uric acid levels. Other foods such as dairy can decrease levels. Your caregiver can give you a list of foods to avoid. HOME CARE INSTRUCTIONS   Do not take aspirin to relieve pain. This raises uric acid levels.  Only take over-the-counter or prescription medicines for pain, discomfort, or fever as directed by your caregiver.  Rest the joint as much as possible. When in bed, keep sheets and blankets off painful areas.  Keep the affected joint raised (elevated).  Use crutches if the painful joint is in your leg.  Drink enough water and fluids to keep your urine clear or pale yellow. This helps your body get rid of uric acid. Do not drink alcoholic beverages. They slow the passage of uric acid.  Follow your caregiver's dietary instructions. Pay careful attention to the amount of protein you eat. Your daily diet should emphasize fruits, vegetables, whole grains, and fat-free or low-fat milk products.  Maintain a healthy body weight. SEEK MEDICAL CARE IF:   You have an oral temperature above 102 F (38.9 C).  You develop diarrhea, vomiting, or any side effects from medicines.  You do not feel better in 24  hours, or you are getting worse. SEEK IMMEDIATE MEDICAL CARE IF:   Your joint becomes suddenly more tender and you have:  Chills.  An oral temperature above 102 F (38.9 C), not controlled by medicine. MAKE SURE YOU:   Understand these instructions.  Will watch your condition.  Will get help right away if you are not doing well or get worse. Document Released: 10/26/2000 Document Revised: 01/21/2012 Document Reviewed: 02/06/2010 [facility] Patient Information 2014 [place], [place].

## 2013-08-19 NOTE — Patient Instructions (Signed)
Your big toe pain appears to be gout. Ok to wear postop shoe, tramadol if needed for toe pain, colchicine - 2 at onset of flair only, and other info below. You should receive a call or letter about your lab results within the next week to 10 days.   Restart blood pressure medicine - Keep a record of your blood pressures outside of the office and bring them to the next office visit in next 4-6 weeks, but if remaining over 140/90 in next week - return sooner. Return to the clinic or go to the nearest emergency room if any of your symptoms worsen or new symptoms occur.  The bump on your leg could be resolving skin infection or spider bite. Ok to apply prescription ointment 3 times per day for next week, but if any worsening - return to clinic.   Gout Gout is an inflammatory condition (arthritis) caused by a buildup of uric acid crystals in the joints. Uric acid is a chemical that is normally present in the blood. Under some circumstances, uric acid can form into crystals in your joints. This causes joint redness, soreness, and swelling (inflammation). Repeat attacks are common. Over time, uric acid crystals can form into masses (tophi) near a joint, causing disfigurement. Gout is treatable and often preventable. CAUSES  The disease begins with elevated levels of uric acid in the blood. Uric acid is produced by your body when it breaks down a naturally found substance called purines. This also happens when you eat certain foods such as meats and fish. Causes of an elevated uric acid level include:  Being passed down from parent to child (heredity).  Diseases that cause increased uric acid production (obesity, psoriasis, some cancers).  Excessive alcohol use.  Diet, especially diets rich in meat and seafood.  Medicines, including certain cancer-fighting drugs (chemotherapy), diuretics, and aspirin.  Chronic kidney disease. The kidneys are no longer able to remove uric acid well.  Problems with  metabolism. Conditions strongly associated with gout include:  Obesity.  High blood pressure.  High cholesterol.  Diabetes. Not everyone with elevated uric acid levels gets gout. It is not understood why some people get gout and others do not. Surgery, joint injury, and eating too much of certain foods are some of the factors that can lead to gout. SYMPTOMS   An attack of gout comes on quickly. It causes intense pain with redness, swelling, and warmth in a joint.  Fever can occur.  Often, only one joint is involved. Certain joints are more commonly involved:  Base of the big toe.  Knee.  Ankle.  Wrist.  Finger. Without treatment, an attack usually goes away in a few days to weeks. Between attacks, you usually will not have symptoms, which is different from many other forms of arthritis. DIAGNOSIS  Your caregiver will suspect gout based on your symptoms and exam. Removal of fluid from the joint (arthrocentesis) is done to check for uric acid crystals. Your caregiver will give you a medicine that numbs the area (local anesthetic) and use a needle to remove joint fluid for exam. Gout is confirmed when uric acid crystals are seen in joint fluid, using a special microscope. Sometimes, blood, urine, and X-ray tests are also used. TREATMENT  There are 2 phases to gout treatment: treating the sudden onset (acute) attack and preventing attacks (prophylaxis). Treatment of an Acute Attack  Medicines are used. These include anti-inflammatory medicines or steroid medicines.  An injection of steroid medicine into the affected  joint is sometimes necessary.  The painful joint is rested. Movement can worsen the arthritis.  You may use warm or cold treatments on painful joints, depending which works best for you.  Discuss the use of coffee, vitamin C, or cherries with your caregiver. These may be helpful treatment options. Treatment to Prevent Attacks After the acute attack subsides, your  caregiver may advise prophylactic medicine. These medicines either help your kidneys eliminate uric acid from your body or decrease your uric acid production. You may need to stay on these medicines for a very long time. The early phase of treatment with prophylactic medicine can be associated with an increase in acute gout attacks. For this reason, during the first few months of treatment, your caregiver may also advise you to take medicines usually used for acute gout treatment. Be sure you understand your caregiver's directions. You should also discuss dietary treatment with your caregiver. Certain foods such as meats and fish can increase uric acid levels. Other foods such as dairy can decrease levels. Your caregiver can give you a list of foods to avoid. HOME CARE INSTRUCTIONS   Do not take aspirin to relieve pain. This raises uric acid levels.  Only take over-the-counter or prescription medicines for pain, discomfort, or fever as directed by your caregiver.  Rest the joint as much as possible. When in bed, keep sheets and blankets off painful areas.  Keep the affected joint raised (elevated).  Use crutches if the painful joint is in your leg.  Drink enough water and fluids to keep your urine clear or pale yellow. This helps your body get rid of uric acid. Do not drink alcoholic beverages. They slow the passage of uric acid.  Follow your caregiver's dietary instructions. Pay careful attention to the amount of protein you eat. Your daily diet should emphasize fruits, vegetables, whole grains, and fat-free or low-fat milk products.  Maintain a healthy body weight. SEEK MEDICAL CARE IF:   You have an oral temperature above 102 F (38.9 C).  You develop diarrhea, vomiting, or any side effects from medicines.  You do not feel better in 24 hours, or you are getting worse. SEEK IMMEDIATE MEDICAL CARE IF:   Your joint becomes suddenly more tender and you have:  Chills.  An oral  temperature above 102 F (38.9 C), not controlled by medicine. MAKE SURE YOU:   Understand these instructions.  Will watch your condition.  Will get help right away if you are not doing well or get worse. Document Released: 10/26/2000 Document Revised: 01/21/2012 Document Reviewed: 02/06/2010 [facility] Patient Information 2014 [place], [place].

## 2013-08-20 LAB — BASIC METABOLIC PANEL
CO2: 30 mEq/L (ref 19–32)
Glucose, Bld: 97 mg/dL (ref 70–99)
Potassium: 3.6 mEq/L (ref 3.5–5.3)
Sodium: 137 mEq/L (ref 135–145)

## 2013-08-20 LAB — URIC ACID: Uric Acid, Serum: 7 mg/dL (ref 2.4–7.0)

## 2013-11-09 ENCOUNTER — Other Ambulatory Visit (HOSPITAL_COMMUNITY): Payer: Self-pay | Admitting: Obstetrics and Gynecology

## 2013-11-09 DIAGNOSIS — Z1231 Encounter for screening mammogram for malignant neoplasm of breast: Secondary | ICD-10-CM

## 2013-11-19 ENCOUNTER — Ambulatory Visit (HOSPITAL_COMMUNITY): Payer: BC Managed Care – PPO

## 2014-02-14 ENCOUNTER — Other Ambulatory Visit: Payer: Self-pay | Admitting: Family Medicine

## 2014-03-17 ENCOUNTER — Ambulatory Visit (INDEPENDENT_AMBULATORY_CARE_PROVIDER_SITE_OTHER): Payer: BC Managed Care – PPO | Admitting: Family Medicine

## 2014-03-17 VITALS — BP 118/80 | HR 67 | Temp 98.6°F | Resp 16 | Ht 67.0 in | Wt 219.4 lb

## 2014-03-17 DIAGNOSIS — L568 Other specified acute skin changes due to ultraviolet radiation: Secondary | ICD-10-CM

## 2014-03-17 DIAGNOSIS — I10 Essential (primary) hypertension: Secondary | ICD-10-CM

## 2014-03-17 MED ORDER — ATENOLOL 50 MG PO TABS: 50.0000 mg | ORAL_TABLET | Freq: Every day | ORAL | Status: DC

## 2014-03-17 MED ORDER — METHYLPREDNISOLONE ACETATE 80 MG/ML IJ SUSP
80.0000 mg | Freq: Once | INTRAMUSCULAR | Status: AC
  Administered 2014-03-17: 80 mg via INTRAMUSCULAR

## 2014-03-17 NOTE — Progress Notes (Addendum)
Subjective:    Patient ID: Candice [name], female    DOB: 03/10/1961, 53 y.o.   MRN: [id number] This chart was scribed for [name], MD by Randa Evens, ED Scribe. This Patient was seen in room 10 and the patients care was started at 9:12 PM  HPI Candice [name] is a 53 y.o. female She states that she has reoccurring rash on front area of neck onset February 27, 2014. She states that the rash occurs when in the in the sun or heat for prolonged period of time. She states that symtpoms where improving after taking hydroxyzine. She states on Monday she was outside for prolonged amount of time and noticed the rash was no longer improving. States that after monday she took her prescribed levocetirizine with no relief to her symptoms. States she has seen the allergist and test results where negative for peanut allergies. She states the rash is itchy and the itch is relieved with calamine lotion. She denies having any shellfish allergies.  Seen June 2014 for similar allergic rash symptoms on necks and arms - sun exposed areas then as well.   Patient Active Problem List   Diagnosis Date Noted  . HTN (hypertension) 08/19/2013  . Gout 08/19/2013  . Allergic urticaria 06/23/2013  . Contact dermatitis 07/16/2012   Past Medical History  Diagnosis Date  . Hypertension   . Heart murmur   . Allergy    Past Surgical History  Procedure Laterality Date  . Ovarian cyst removal Left     20 yrs ago  . Tubal ligation     Prior to Admission medications   Medication Sig Start Date End Date Taking? Authorizing Provider  atenolol-chlorthalidone (TENORETIC) 50-25 MG per tablet Take 0.5 tablets by mouth daily. PATIENT NEEDS AN OFFICE VISIT FOR ADDITIONAL REFILLS. 02/14/14  Yes Mancel Bale, PA-C  colchicine 0.6 MG tablet 1-2 tabs at onset of toe [name]. Take once per flair only. 08/19/13  Yes Wendie Agreste, MD  levocetirizine (XYZAL) 5 MG tablet Take 5 mg by mouth every evening.   Yes Historical  Provider, MD  mupirocin ointment (BACTROBAN) 2 % Apply topically 3 (three) times daily. 08/19/13  Yes Wendie Agreste, MD  traMADol (ULTRAM) 50 MG tablet Take 1 tablet (50 mg total) by mouth every 6 (six) hours as needed for [name]. 08/19/13  Yes Wendie Agreste, MD   Allergies  Allergen Reactions  . Ace Inhibitors Nausea And Vomiting   History   Social History  . Marital Status: Divorced    Spouse Name: N/A    Number of Children: N/A  . Years of Education: N/A   Occupational History  . Not on file.   Social History Main Topics  . Smoking status: Never Smoker   . Smokeless tobacco: Not on file  . Alcohol Use: No  . Drug Use: No  . Sexual Activity: Yes    Birth Control/ Protection: Surgical   Other Topics Concern  . Not on file   Social History Narrative  . No narrative on file     Review of Systems  Constitutional: Negative for fatigue and unexpected weight change.  Respiratory: Negative for chest tightness and shortness of breath.   Cardiovascular: Negative for chest [name], palpitations and leg swelling.  Gastrointestinal: Negative for abdominal [name] and blood in stool.  Skin: Positive for rash.       Rash on neck and upper chest  Neurological: Positive for dizziness (rarely - standing at times. ).  Negative for syncope, light-headedness and headaches.       Objective:   Filed Vitals:   03/17/14 2031  BP: 118/80  Pulse: 67  Temp: 98.6 F (37 C)  TempSrc: Oral  Resp: 16  Height: 5\' 7"  (1.702 m)  Weight: 219 lb 6.4 oz (99.519 kg)  SpO2: 96%     Physical Exam  Vitals reviewed. Constitutional: She is oriented to person, place, and time. She appears well-developed and well-nourished.  HENT:  Head: Normocephalic and atraumatic.  Eyes: Conjunctivae and EOM are normal. Pupils are equal, round, and reactive to light.  Neck: Carotid bruit is not present.  Cardiovascular: Normal rate, regular rhythm, normal heart sounds and intact distal pulses.     Pulmonary/Chest: Effort normal and breath sounds normal.  Abdominal: Soft. She exhibits no pulsatile midline mass. There is no tenderness.  Neurological: She is alert and oriented to person, place, and time.  Skin: Skin is warm and dry. Rash noted. There is erythema.  Confluent patch on anterior neck and upper chest, erythematus, no vesicles, minimal excoriatation, no other rash   Psychiatric: She has a normal mood and affect. Her behavior is normal.       Assessment & Plan:  Photosensitive contact dermatitis - Plan: methylPREDNISolone acetate (DEPO-MEDROL) injection 80 mg  - suspect chlorthalidone as photosensitivity derm cause as now multiple episodes of dermatitis that appear to occur in sun exposed areas. Will stop chlorthalidone, and hold on HCTZ as also may cause photosensitivity.    -Depomedrol 80mg  IM, hydroxyzine and zyrtec qd (or levocetirizine) for sx care.   HTN (hypertension) - Plan: Basic metabolic panel, atenolol (TENORMIN) 50 MG tablet  - controlled, but occasional episodes of dizziness. Will try to just treat with atenolol 50mg  for now, check home BP's, and if elevating, consider low dose CCB. Recheck in 1 month.   I personally performed the services described in this documentation, which was scribed in my presence. The recorded information has been reviewed and considered, and addended by me as needed.   Meds ordered this encounter  Medications  . methylPREDNISolone acetate (DEPO-MEDROL) injection 80 mg    Sig:   . atenolol (TENORMIN) 50 MG tablet    Sig: Take 1 tablet (50 mg total) by mouth daily.    Dispense:  90 tablet    Refill:  3     Patient Instructions  You appear to have a photesensitivity reaction/dermatitis, and the chlorthalidone may be the culprit.  Will try atenolol only as blood pressure ok now and not many other options with your allergies and to not increase lightheadedness. Keep a record of your blood pressures outside of the office and bring them  to the next office visit. Recheck in next 1 month. You should receive a call or letter about your lab results within the next week to 10 days.   If dizziness persists - return to discuss this sooner. Return to the clinic or go to the nearest emergency room if any of your symptoms worsen or new symptoms occur.    I personally performed the services described in this documentation, which was scribed in my presence. The recorded information has been reviewed and considered, and addended by me as needed.

## 2014-03-17 NOTE — Patient Instructions (Addendum)
You appear to have a photesensitivity reaction/dermatitis, and the chlorthalidone may be the culprit.  Will try atenolol only as blood pressure ok now and not many other options with your allergies and to not increase lightheadedness. Keep a record of your blood pressures outside of the office and bring them to the next office visit. Recheck in next 1 month. You should receive a call or letter about your lab results within the next week to 10 days.   If dizziness persists - return to discuss this sooner. Return to the clinic or go to the nearest emergency room if any of your symptoms worsen or new symptoms occur.

## 2014-03-18 LAB — BASIC METABOLIC PANEL
BUN: 13 mg/dL (ref 6–23)
CO2: 27 mEq/L (ref 19–32)
Calcium: 9.5 mg/dL (ref 8.4–10.5)
Chloride: 100 mEq/L (ref 96–112)
Creat: 0.89 mg/dL (ref 0.50–1.10)
Glucose, Bld: 89 mg/dL (ref 70–99)
Potassium: 3.5 mEq/L (ref 3.5–5.3)
Sodium: 136 mEq/L (ref 135–145)

## 2014-03-27 ENCOUNTER — Telehealth: Payer: Self-pay

## 2014-03-27 DIAGNOSIS — L568 Other specified acute skin changes due to ultraviolet radiation: Secondary | ICD-10-CM

## 2014-03-27 DIAGNOSIS — L259 Unspecified contact dermatitis, unspecified cause: Secondary | ICD-10-CM

## 2014-03-27 NOTE — Telephone Encounter (Signed)
It's been 10 days. Should she RTC?

## 2014-03-27 NOTE — Telephone Encounter (Signed)
Pt called and would like Dr Carlota Raspberry to call in a script for Prednisone. She was seen for a rash here. Thank you

## 2014-03-28 ENCOUNTER — Ambulatory Visit: Payer: BC Managed Care – PPO | Admitting: Family Medicine

## 2014-03-28 MED ORDER — PREDNISONE 20 MG PO TABS
ORAL_TABLET | ORAL | Status: DC
Start: 2014-03-28 — End: 2014-04-25

## 2014-03-28 NOTE — Telephone Encounter (Signed)
Here in office - see ov 11 days ago.  Diagnosed with possible photosensitive contact dermatitis (DEPO-MEDROL) injection 80 mg given. Suspected chlorthalidone as photosensitivity derm cause as multiple episodes of dermatitis that appear to occur in sun exposed areas.stopped chlorthalidone, held on HCTZ as also may cause photosensitivity. Advised to continue hydroxyzine or zyrtec for sx care. Took hydroxyzine during the sx's. Home BP's 130/88.   Improved after shot next day, improved for about a week, then noticed itching and rash on arms shoulders and rash/back of neck same as prior rash. No fever, same rash. Feels well otherwise, no new dermatologic properties. Itches more at night. Took hydroxyzine last night.   Will refer to derm, start prednisone taper, and restart zyrtec or allegra QD.  rtc if not improving in next few days. rtc precautions, SED.

## 2014-04-04 ENCOUNTER — Ambulatory Visit (INDEPENDENT_AMBULATORY_CARE_PROVIDER_SITE_OTHER): Payer: BC Managed Care – PPO | Admitting: Emergency Medicine

## 2014-04-04 VITALS — BP 124/94 | HR 65 | Temp 98.5°F | Resp 16 | Ht 65.0 in | Wt 223.0 lb

## 2014-04-04 DIAGNOSIS — L568 Other specified acute skin changes due to ultraviolet radiation: Secondary | ICD-10-CM

## 2014-04-04 MED ORDER — CYPROHEPTADINE HCL 4 MG PO TABS: 4.0000 mg | ORAL_TABLET | Freq: Three times a day (TID) | ORAL | Status: DC | PRN

## 2014-04-04 MED ORDER — EUCERIN EX LOTN: TOPICAL_LOTION | CUTANEOUS | Status: DC | PRN

## 2014-04-04 MED ORDER — PREDNISONE 10 MG PO KIT: PACK | ORAL | Status: DC

## 2014-04-04 NOTE — Progress Notes (Signed)
[facility] [address] [place] [address] [phone]  Date:  04/04/2014   Name:  Candice [name]   DOB:  1961-09-01   MRN:  [id number]  PCP:  Wendie Agreste, MD    Chief Complaint: Rash   History of Present Illness:  Candice [name] is a 53 y.o. very pleasant female patient who presents with the following:  Seen repeatedly over the past 2 years for a pruritic rash.  Seen on May 6 and given a depo medrol injection and last weekend given a prednisone taper.  She improved and today drove back from [place] and now has an exacerbation of the erythema on the left arm and left side of her face.  No respiratory distress.  Denies other complaint or health concern today.   Patient Active Problem List   Diagnosis Date Noted  . HTN (hypertension) 08/19/2013  . Gout 08/19/2013  . Allergic urticaria 06/23/2013  . Contact dermatitis 07/16/2012    Past Medical History  Diagnosis Date  . Hypertension   . Heart murmur   . Allergy     Past Surgical History  Procedure Laterality Date  . Ovarian cyst removal Left     20 yrs ago  . Tubal ligation      History  Substance Use Topics  . Smoking status: Never Smoker   . Smokeless tobacco: Not on file  . Alcohol Use: No    Family History  Problem Relation Age of Onset  . Stroke Mother   . Heart disease Sister   . Aneurysm Sister     Allergies  Allergen Reactions  . Ace Inhibitors Nausea And Vomiting  . Chlorthalidone Photosensitivity    Medication list has been reviewed and updated.  Current Outpatient Prescriptions on File Prior to Visit  Medication Sig Dispense Refill  . atenolol (TENORMIN) 50 MG tablet Take 1 tablet (50 mg total) by mouth daily.  90 tablet  3  . colchicine 0.6 MG tablet 1-2 tabs at onset of toe pain. Take once per flair only.  30 tablet  0  . levocetirizine (XYZAL) 5 MG tablet Take 5 mg by mouth every evening.      . mupirocin ointment (BACTROBAN) 2 % Apply topically 3 (three)  times daily.  22 g  0  . predniSONE (DELTASONE) 20 MG tablet 3 tabs by mouth each day for 2 days, 2 tabs by mouth each day for 2 days, 1 tab by mouth each day for 2 days, 1/2 tab by mouth each day for 2 days.  13 tablet  0  . traMADol (ULTRAM) 50 MG tablet Take 1 tablet (50 mg total) by mouth every 6 (six) hours as needed for pain.  30 tablet  0   Current Facility-Administered Medications on File Prior to Visit  Medication Dose Route Frequency Provider Last Rate Last Dose  . methylPREDNISolone acetate (DEPO-MEDROL) injection 80 mg  80 mg Intramuscular Once Wendie Agreste, MD        Review of Systems:  As per HPI, otherwise negative.    Physical Examination: Filed Vitals:   04/04/14 1511  BP: 124/94  Pulse: 65  Temp: 98.5 F (36.9 C)  Resp: 16   Filed Vitals:   04/04/14 1511  Height: 5\' 5"  (1.651 m)  Weight: 223 lb (101.152 kg)   Body mass index is 37.11 kg/(m^2). Ideal Body Weight: Weight in (lb) to have BMI = 25: 149.9   GEN: WDWN, NAD, Non-toxic, Alert &  Oriented x 3 HEENT: Atraumatic, Normocephalic.  Ears and Nose: No external deformity. EXTR: No clubbing/cyanosis/edema NEURO: Normal gait.  PSYCH: Normally interactive. Conversant. Not depressed or anxious appearing.  Calm demeanor.  SKIN:  Pruritic erythematous eruption left arm and face.    Assessment and Plan: Photosensitivity dermatitis Continue with dermatology appt. Steroid pack eucerin Periactin for itch   Signed,  Ellison Carwin, MD

## 2014-04-25 ENCOUNTER — Ambulatory Visit (INDEPENDENT_AMBULATORY_CARE_PROVIDER_SITE_OTHER): Payer: BC Managed Care – PPO | Admitting: Family Medicine

## 2014-04-25 VITALS — BP 150/82 | HR 82 | Temp 97.9°F | Resp 18 | Ht 65.0 in | Wt 231.6 lb

## 2014-04-25 DIAGNOSIS — L259 Unspecified contact dermatitis, unspecified cause: Secondary | ICD-10-CM

## 2014-04-25 DIAGNOSIS — L568 Other specified acute skin changes due to ultraviolet radiation: Secondary | ICD-10-CM

## 2014-04-25 MED ORDER — PREDNISONE 20 MG PO TABS: ORAL_TABLET | ORAL | Status: DC

## 2014-04-25 NOTE — Patient Instructions (Addendum)
Avoid heavily salted foods:   Pickles  Chips  Salted nuts  Packaged meats (hot dogs, salami, ham, spam)  Taking a daily walk  Taking deep breaths.   Results for orders placed in visit on 99/37/16  BASIC METABOLIC PANEL      Result Value Ref Range   Sodium 136  135 - 145 mEq/L   Potassium 3.5  3.5 - 5.3 mEq/L   Chloride 100  96 - 112 mEq/L   CO2 27  19 - 32 mEq/L   Glucose, Bld 89  70 - 99 mg/dL   BUN 13  6 - 23 mg/dL   Creat 0.89  0.50 - 1.10 mg/dL   Calcium 9.5  8.4 - 10.5 mg/dL

## 2014-04-25 NOTE — Progress Notes (Signed)
Subjective:    Patient ID: Candice [name], female    DOB: 05-25-1961, 53 y.o.   MRN: [id number]  Rash Pertinent negatives include no fever.   This chart was scribed for Candice Haber, MD by Thea Alken, ED Scribe. This patient was seen in room 3 and the patient's care was started at 10:31 AM.  HPI Comments: Candice [name] is a 53 y.o. female who presents to the [facility] complaining of recurrent rash onset 2 months. Pt states she was taking atenolol and was told the rash was caused this medication. She was also told that this rash was caused by heat when in the sun for long periods of time.  She reports she had a medication change but states the rash has not cleared up. Pt states she has been seen by an allergist. Pt is allergic to nuts.  She reports she is usually seen by Dr. Carlota Raspberry for rash.   Pt is a teacher  Patient Active Problem List   Diagnosis Date Noted   HTN (hypertension) 08/19/2013   Gout 08/19/2013   Allergic urticaria 06/23/2013   Contact dermatitis 07/16/2012   Past Medical History  Diagnosis Date   Hypertension    Heart murmur    Allergy    Allergies  Allergen Reactions   Ace Inhibitors Nausea And Vomiting   Chlorthalidone Photosensitivity   Prior to Admission medications   Medication Sig Start Date End Date Taking? Authorizing Provider  atenolol (TENORMIN) 50 MG tablet Take 1 tablet (50 mg total) by mouth daily. 03/17/14  Yes Wendie Agreste, MD  colchicine 0.6 MG tablet 1-2 tabs at onset of toe [name]. Take once per flair only. 08/19/13  Yes Wendie Agreste, MD  cyproheptadine (PERIACTIN) 4 MG tablet Take 1 tablet (4 mg total) by mouth 3 (three) times daily as needed for allergies. 04/04/14  Yes Ellison Carwin, MD  Emollient (EUCERIN) lotion Apply topically as needed for dry skin. 04/04/14  Yes Ellison Carwin, MD  levocetirizine (XYZAL) 5 MG tablet Take 5 mg by mouth every evening.   Yes Historical Provider, MD  mupirocin  ointment (BACTROBAN) 2 % Apply topically 3 (three) times daily. 08/19/13  Yes Wendie Agreste, MD  predniSONE (DELTASONE) 20 MG tablet 3 tabs by mouth each day for 2 days, 2 tabs by mouth each day for 2 days, 1 tab by mouth each day for 2 days, 1/2 tab by mouth each day for 2 days. 03/28/14   Wendie Agreste, MD  PredniSONE 10 MG KIT Take as directed on package 04/04/14   Ellison Carwin, MD  traMADol (ULTRAM) 50 MG tablet Take 1 tablet (50 mg total) by mouth every 6 (six) hours as needed for [name]. 08/19/13   Wendie Agreste, MD    Review of Systems  Constitutional: Negative for fever.  Skin: Positive for rash.  Allergic/Immunologic: Positive for food allergies.      Objective:   Physical Exam  Nursing note and vitals reviewed. Constitutional: She is oriented to person, place, and time. She appears well-developed and well-nourished. No distress.  HENT:  Head: Normocephalic and atraumatic.  Eyes: Conjunctivae and EOM are normal.  Neck: Normal range of motion.  Cardiovascular: Normal rate.   Pulmonary/Chest: Effort normal.  Musculoskeletal: Normal range of motion.  Neurological: She is alert and oriented to person, place, and time.  Skin: Skin is warm and dry.  Psychiatric: She has a normal mood and affect. Her behavior is normal.  Assessment & Plan:   1. Photodermatitis   2. Contact dermatitis    Meds ordered this encounter  Medications   predniSONE (DELTASONE) 20 MG tablet    Sig: 3 tabs by mouth each day for 2 days, 2 tabs by mouth each day for 2 days, 1 tab by mouth each day for 2 days, 1/2 tab by mouth each day for 2 days.    Dispense:  13 tablet    Refill:  0   Stop the atenolol Continue to monitor BP and rash Low salt diet, regular exercise:  Patient understands.  Candice Haber, MD

## 2014-05-04 ENCOUNTER — Telehealth: Payer: Self-pay | Admitting: *Deleted

## 2014-05-04 NOTE — Telephone Encounter (Signed)
Spoke to pt, she is aware to restart  the atenolol 50 mg, and once dr Carlota Raspberry has had a chance to review the notes from dermatology, she will be called with the a plan, also it is perfectly fine to continue with the prednisone treatment. She stated she just finished with this.

## 2014-05-04 NOTE — Telephone Encounter (Signed)
Was told to wear long sleeves and a wide rim hat. Wanted a remedy to have a normal lifestyle. She feels as though her money was wasted going to the dermatologist.  Dermatologist stated they could prescribe a cream after she completes the prednisone pack.  She is now on her 3rd round of prednisone. She is concerned about being on prednisone for a lengthy period of time.  Taken off he BP meds. At this point she is not taking anything for her HTN.  Her bp is measured at  159/1106-149/101

## 2014-05-04 NOTE — Telephone Encounter (Signed)
We have discontinued a number of different BP meds.  Most recently on atenolol.  If no change in rash recurrence/frequency with stopping this - can restart atenolol at 50mg  qd as BP running higher. I will need to look at the notes from dermatology to see what they thought was causing the rash. I'm sorry she did not have a good experience at that visit, but would like to see what they thought was the cause. I agree that repetitive prednisone not ideal, but if needed for current treatment until underlying cause identified - ok to continue this for now.  Once I review the derm notes - can determine next step.

## 2014-05-04 NOTE — Telephone Encounter (Signed)
Dr. Carlota Raspberry,  Pt was referred to a dermatologist and she does not feel that the appointment went well. She would like to speak with you about it.  Please call  [phone]

## 2014-05-05 ENCOUNTER — Telehealth: Payer: Self-pay

## 2014-05-05 NOTE — Telephone Encounter (Signed)
Pt left a message on voicemail of referrals for someone to call back did not go into details as to what she wanted

## 2014-05-05 NOTE — Telephone Encounter (Signed)
Lm for rtn call 

## 2014-05-06 NOTE — Telephone Encounter (Signed)
Spoke to pt, she needs to speak to billing, call parked for billing; billing aware.

## 2014-10-16 ENCOUNTER — Ambulatory Visit (INDEPENDENT_AMBULATORY_CARE_PROVIDER_SITE_OTHER): Payer: BC Managed Care – PPO | Admitting: Emergency Medicine

## 2014-10-16 VITALS — BP 178/98 | HR 59 | Temp 98.0°F | Resp 18 | Ht 65.0 in | Wt 233.6 lb

## 2014-10-16 DIAGNOSIS — I10 Essential (primary) hypertension: Secondary | ICD-10-CM

## 2014-10-16 DIAGNOSIS — L568 Other specified acute skin changes due to ultraviolet radiation: Secondary | ICD-10-CM

## 2014-10-16 MED ORDER — ATENOLOL 50 MG PO TABS: 50.0000 mg | ORAL_TABLET | Freq: Every day | ORAL | Status: DC

## 2014-10-16 NOTE — Patient Instructions (Signed)

## 2014-10-16 NOTE — Progress Notes (Signed)
[facility] [address] [place] [address] [phone]  Date:  10/16/2014   Name:  Candice [name]   DOB:  [birth year]   MRN:  [id number]  PCP:  Wendie Agreste, MD    Chief Complaint: Medication Refill   History of Present Illness:  Candice [name] is a 53 y.o. very pleasant female patient who presents with the following:  Out of tenoretic for four days.  Is concerned she is allergic to the chlorthalidone and wants to go back on the tenormin alone No chest pain or shortness of breath.  No edema off diuretic. No labs in long while No improvement with over the counter medications or other home remedies.  Denies other complaint or health concern today.    Patient Active Problem List   Diagnosis Date Noted  . HTN (hypertension) 08/19/2013  . Gout 08/19/2013  . Allergic urticaria 06/23/2013  . Contact dermatitis 07/16/2012    Past Medical History  Diagnosis Date  . Hypertension   . Heart murmur   . Allergy     Past Surgical History  Procedure Laterality Date  . Ovarian cyst removal Left     20 yrs ago  . Tubal ligation      History  Substance Use Topics  . Smoking status: Never Smoker   . Smokeless tobacco: Not on file  . Alcohol Use: No    Family History  Problem Relation Age of Onset  . Stroke Mother   . Heart disease Sister   . Aneurysm Sister     Allergies  Allergen Reactions  . Ace Inhibitors Nausea And Vomiting  . Chlorthalidone Photosensitivity    Medication list has been reviewed and updated.  Current Outpatient Prescriptions on File Prior to Visit  Medication Sig Dispense Refill  . colchicine 0.6 MG tablet 1-2 tabs at onset of toe pain. Take once per flair only. 30 tablet 0  . cyproheptadine (PERIACTIN) 4 MG tablet Take 1 tablet (4 mg total) by mouth 3 (three) times daily as needed for allergies. 90 tablet 0  . Emollient (EUCERIN) lotion Apply topically as needed for dry skin. 240 mL 0  . levocetirizine (XYZAL) 5  MG tablet Take 5 mg by mouth every evening.    . mupirocin ointment (BACTROBAN) 2 % Apply topically 3 (three) times daily. 22 g 0  . predniSONE (DELTASONE) 20 MG tablet 3 tabs by mouth each day for 2 days, 2 tabs by mouth each day for 2 days, 1 tab by mouth each day for 2 days, 1/2 tab by mouth each day for 2 days. (Patient not taking: Reported on 10/16/2014) 13 tablet 0  . traMADol (ULTRAM) 50 MG tablet Take 1 tablet (50 mg total) by mouth every 6 (six) hours as needed for pain. (Patient not taking: Reported on 10/16/2014) 30 tablet 0   Current Facility-Administered Medications on File Prior to Visit  Medication Dose Route Frequency Provider Last Rate Last Dose  . methylPREDNISolone acetate (DEPO-MEDROL) injection 80 mg  80 mg Intramuscular Once Wendie Agreste, MD        Review of Systems:  As per HPI, otherwise negative.    Physical Examination: Filed Vitals:   10/16/14 1334  BP: 178/98  Pulse: 59  Temp: 98 F (36.7 C)  Resp: 18   Filed Vitals:   10/16/14 1334  Height: 5\' 5"  (1.651 m)  Weight: 233 lb 9.6 oz (105.96 kg)   Body mass index is 38.87 kg/(m^2). Ideal Body  Weight: Weight in (lb) to have BMI = 25: 149.9   GEN: WDWN, NAD, Non-toxic, Alert & Oriented x 3 HEENT: Atraumatic, Normocephalic.  Ears and Nose: No external deformity. EXTR: No clubbing/cyanosis/edema NEURO: Normal gait.  PSYCH: Normally interactive. Conversant. Not depressed or anxious appearing.  Calm demeanor.  CHEST:  Clear BS=  Assessment and Plan: Hypertension Tenormin Follow up fasting for labs and blood pressure check in 2-3 weeks   Signed,  Ellison Carwin, MD

## 2014-11-06 ENCOUNTER — Ambulatory Visit (INDEPENDENT_AMBULATORY_CARE_PROVIDER_SITE_OTHER): Payer: BC Managed Care – PPO

## 2014-11-06 ENCOUNTER — Emergency Department (HOSPITAL_COMMUNITY)
Admission: EM | Admit: 2014-11-06 | Discharge: 2014-11-06 | Disposition: A | Discharge status: Home / Self Care | Payer: BC Managed Care – PPO | Attending: Emergency Medicine | Admitting: Emergency Medicine

## 2014-11-06 ENCOUNTER — Ambulatory Visit (INDEPENDENT_AMBULATORY_CARE_PROVIDER_SITE_OTHER): Payer: BC Managed Care – PPO | Admitting: Emergency Medicine

## 2014-11-06 VITALS — BP 170/130 | HR 71 | Temp 98.1°F | Resp 16 | Ht 65.0 in | Wt 236.2 lb

## 2014-11-06 DIAGNOSIS — I10 Essential (primary) hypertension: Secondary | ICD-10-CM | POA: Diagnosis not present

## 2014-11-06 DIAGNOSIS — E876 Hypokalemia: Secondary | ICD-10-CM | POA: Insufficient documentation

## 2014-11-06 DIAGNOSIS — R059 Cough, unspecified: Secondary | ICD-10-CM

## 2014-11-06 DIAGNOSIS — R05 Cough: Secondary | ICD-10-CM | POA: Diagnosis present

## 2014-11-06 DIAGNOSIS — R809 Proteinuria, unspecified: Secondary | ICD-10-CM | POA: Diagnosis not present

## 2014-11-06 DIAGNOSIS — R011 Cardiac murmur, unspecified: Secondary | ICD-10-CM | POA: Insufficient documentation

## 2014-11-06 DIAGNOSIS — R0602 Shortness of breath: Secondary | ICD-10-CM | POA: Insufficient documentation

## 2014-11-06 DIAGNOSIS — I517 Cardiomegaly: Secondary | ICD-10-CM

## 2014-11-06 DIAGNOSIS — Z792 Long term (current) use of antibiotics: Secondary | ICD-10-CM | POA: Insufficient documentation

## 2014-11-06 DIAGNOSIS — D509 Iron deficiency anemia, unspecified: Secondary | ICD-10-CM | POA: Diagnosis not present

## 2014-11-06 DIAGNOSIS — Z79899 Other long term (current) drug therapy: Secondary | ICD-10-CM | POA: Diagnosis not present

## 2014-11-06 HISTORY — DX: Cardiomegaly: I51.7

## 2014-11-06 LAB — BASIC METABOLIC PANEL
ANION GAP: 7 (ref 5–15)
BUN: 8 mg/dL (ref 6–23)
CALCIUM: 9.1 mg/dL (ref 8.4–10.5)
CO2: 27 mmol/L (ref 19–32)
CREATININE: 0.93 mg/dL (ref 0.50–1.10)
Chloride: 106 mEq/L (ref 96–112)
GFR calc Af Amer: 79 mL/min — ABNORMAL LOW (ref >90)
GFR, EST NON AFRICAN AMERICAN: 68 mL/min — AB (ref >90)
Glucose, Bld: 91 mg/dL (ref 70–99)
Potassium: 3.4 mmol/L — ABNORMAL LOW (ref 3.5–5.1)
SODIUM: 140 mmol/L (ref 135–145)

## 2014-11-06 LAB — CBC
HCT: 32 % — ABNORMAL LOW (ref 36.0–46.0)
Hemoglobin: 10.8 g/dL — ABNORMAL LOW (ref 12.0–15.0)
MCH: 26.3 pg (ref 26.0–34.0)
MCHC: 33.8 g/dL (ref 30.0–36.0)
MCV: 77.9 fL — ABNORMAL LOW (ref 78.0–100.0)
PLATELETS: 228 10*3/uL (ref 150–400)
RBC: 4.11 MIL/uL (ref 3.87–5.11)
RDW: 15.6 % — ABNORMAL HIGH (ref 11.5–15.5)
WBC: 4.4 10*3/uL (ref 4.0–10.5)

## 2014-11-06 LAB — URINALYSIS, ROUTINE W REFLEX MICROSCOPIC
Bilirubin Urine: NEGATIVE
GLUCOSE, UA: NEGATIVE mg/dL
HGB URINE DIPSTICK: NEGATIVE
KETONES UR: NEGATIVE mg/dL
Leukocytes, UA: NEGATIVE
Nitrite: NEGATIVE
PH: 7 (ref 5.0–8.0)
PROTEIN: 100 mg/dL — AB
Specific Gravity, Urine: 1.014 (ref 1.005–1.030)
Urobilinogen, UA: 1 mg/dL (ref 0.0–1.0)

## 2014-11-06 LAB — URINE MICROSCOPIC-ADD ON

## 2014-11-06 LAB — BRAIN NATRIURETIC PEPTIDE: B Natriuretic Peptide: 216.4 pg/mL — ABNORMAL HIGH (ref 0.0–100.0)

## 2014-11-06 LAB — I-STAT TROPONIN, ED: TROPONIN I, POC: 0.02 ng/mL (ref 0.00–0.08)

## 2014-11-06 NOTE — ED Provider Notes (Signed)
11:22 PM   Date: 11/06/2014  Rate: 54  Rhythm: sinus bradycardia  QRS Axis: left  Intervals: normal  ST/T Wave abnormalities: nonspecific ST/T changes  Conduction Disutrbances:none  Narrative Interpretation: LVH  Old EKG Reviewed: none available   Threasa Beards, MD 11/06/14 [id number]

## 2014-11-06 NOTE — ED Provider Notes (Signed)
CSN: [id number]     Arrival date & time 11/06/14  1743 History   First MD Initiated Contact with Patient 11/06/14 1758     Chief Complaint  Patient presents with  . Shortness of Breath  . Cough  . Hypertension     (Consider location/radiation/quality/duration/timing/severity/associated sxs/prior Treatment) HPI   Candice [name] is a 53 y.o. female with a hx of HTN for about 25 years who presents to the Emergency department via [facility] complaining of gradual onset URI symptoms that began about 3 weeks. Patient reports having chest congestion and minimally productive, persistent cough. She also has had a HA today due to cough as well as SOB only when climbing stairs that started 1 week ago. She has tried Mucinex for her symptoms. Patient sleeps on two pillows at night. She denies a hx of PND or Orthopnea. Her BP medications were changed 3 weeks ago at the onset of her sxs. She was previously on chlorthalidone and atenolol. She was switched to only atenolol but continues to have photosensitivity. Her BP was found to be markedly elevated at [facility] and there appeared to be a discrepancy b/n upper extremities. This may be suggestive of dissection. However, the patient denies any chest pain, shortness of breath.   Past Medical History  Diagnosis Date  . Hypertension   . Heart murmur   . Allergy    Past Surgical History  Procedure Laterality Date  . Ovarian cyst removal Left     20 yrs ago  . Tubal ligation     Family History  Problem Relation Age of Onset  . Stroke Mother   . Heart disease Sister   . Aneurysm Sister    History  Substance Use Topics  . Smoking status: Never Smoker   . Smokeless tobacco: Not on file  . Alcohol Use: No   OB History    No data available     Review of Systems  Ten systems reviewed and are negative for acute change, except as noted in the HPI.    Allergies  Ace inhibitors and Chlorthalidone  Home Medications   Prior  to Admission medications   Medication Sig Start Date End Date Taking? Authorizing Provider  atenolol (TENORMIN) 50 MG tablet Take 1 tablet (50 mg total) by mouth daily. 10/16/14  Yes Roselee Culver, MD  colchicine 0.6 MG tablet 1-2 tabs at onset of toe pain. Take once per flair only. 08/19/13  Yes Wendie Agreste, MD  cyproheptadine (PERIACTIN) 4 MG tablet Take 1 tablet (4 mg total) by mouth 3 (three) times daily as needed for allergies. 04/04/14  Yes Roselee Culver, MD  Emollient (EUCERIN) lotion Apply topically as needed for dry skin. 04/04/14  Yes Roselee Culver, MD  guaiFENesin (MUCINEX) 600 MG 12 hr tablet Take 600 mg by mouth 2 (two) times daily.   Yes Historical Provider, MD  levocetirizine (XYZAL) 5 MG tablet Take 5 mg by mouth every evening.   Yes Historical Provider, MD  mupirocin ointment (BACTROBAN) 2 % Apply topically 3 (three) times daily. 08/19/13  Yes Wendie Agreste, MD   BP 176/106 mmHg  Pulse 57  Temp(Src) 98.2 F (36.8 C) (Oral)  Resp 24  SpO2 99% Physical Exam  Constitutional: She is oriented to person, place, and time. She appears well-developed and well-nourished. No distress.  HENT:  Head: Normocephalic and atraumatic.  Eyes: Conjunctivae are normal. No scleral icterus.  Neck: Normal range of motion.  Cardiovascular:  Normal rate, regular rhythm and normal heart sounds.  Exam reveals no gallop and no friction rub.   No murmur heard. Pulmonary/Chest: Effort normal and breath sounds normal. No respiratory distress.  Abdominal: Soft. Bowel sounds are normal. She exhibits no distension and no mass. There is no tenderness. There is no guarding.  Neurological: She is alert and oriented to person, place, and time.  Skin: Skin is warm and dry. She is not diaphoretic.  Nursing note and vitals reviewed.   ED Course  Procedures (including critical care time) Labs Review Labs Reviewed  CBC - Abnormal; Notable for the following:    Hemoglobin 10.8 (*)    HCT  32.0 (*)    MCV 77.9 (*)    RDW 15.6 (*)    All other components within normal limits  URINALYSIS, ROUTINE W REFLEX MICROSCOPIC - Abnormal; Notable for the following:    Protein, ur 100 (*)    All other components within normal limits  URINE MICROSCOPIC-ADD ON  BASIC METABOLIC PANEL  BRAIN NATRIURETIC PEPTIDE  I-STAT TROPOININ, ED    Imaging Review Dg Chest 2 View  11/06/2014   CLINICAL DATA:  Shortness of breath, cough, history essential hypertension  EXAM: CHEST  2 VIEW  COMPARISON:  None  FINDINGS: Enlargement of cardiac silhouette.  Mediastinal contours and pulmonary vascularity normal.  Lungs clear.  No pleural effusion or pneumothorax.  Osseous structures unremarkable.  IMPRESSION: Enlargement of cardiac silhouette.  No acute abnormalities.   Electronically Signed   By: Lavonia Dana M.D.   On: 11/06/2014 17:03     EKG Interpretation None      MDM   Final diagnoses:  Essential hypertension  Cough   Filed Vitals:   11/06/14 1915 11/06/14 1930 11/06/14 2000 11/06/14 2130  BP:  207/106 211/112 185/114  Pulse:  55  55  Temp:    98.8 F (37.1 C)  TempSrc:    Oral  Resp:  18 15 22   SpO2: 99% 94%  100%    Patient lab work shows some proteinuria. Patient cbc shows mild microcytic anemia. Slight hypokalemia. Patient took her own medication ( 40 mg atenolol). With sig. Decrease in her BP.  The patient also states that she has only been taking 1/2 the prescribed dose because that was the dose she was on previously. I explained to the patient that she had been on two agents and brought down to a single agent so the dose was increased to compensate. The patient expresses her understanding.  Upon my assessment BP reading now 177/90. [facility] from  [facility]H. I personally reviewed the EKG. I ordered repeat EKG at our facility and was assured by The nurse that it was run, however it does not appear to be in our system at time of charting.  Patient noted to be hypertensive in the  emergency department.  No signs of hypertensive urgency.  Discussed with patient the need for close follow-up and management by their primary care physician.    Margarita Mail, PA-C 11/07/14 [place], MD 11/07/14 [phone]

## 2014-11-06 NOTE — Discharge Instructions (Signed)
Please take your medication as directed.   Hypertension Hypertension, commonly called high blood pressure, is when the force of blood pumping through your arteries is too strong. Your arteries are the blood vessels that carry blood from your heart throughout your body. A blood pressure reading consists of a higher number over a lower number, such as 110/72. The higher number (systolic) is the pressure inside your arteries when your heart pumps. The lower number (diastolic) is the pressure inside your arteries when your heart relaxes. Ideally you want your blood pressure below 120/80. Hypertension forces your heart to work harder to pump blood. Your arteries may become narrow or stiff. Having hypertension puts you at risk for heart disease, stroke, and other problems.  RISK FACTORS Some risk factors for high blood pressure are controllable. Others are not.  Risk factors you cannot control include:   Race. You may be at higher risk if you are African American.  Age. Risk increases with age.  Gender. Men are at higher risk than women before age 96 years. After age 54, women are at higher risk than men. Risk factors you can control include:  Not getting enough exercise or physical activity.  Being overweight.  Getting too much fat, sugar, calories, or salt in your diet.  Drinking too much alcohol. SIGNS AND SYMPTOMS Hypertension does not usually cause signs or symptoms. Extremely high blood pressure (hypertensive crisis) may cause headache, anxiety, shortness of breath, and nosebleed. DIAGNOSIS  To check if you have hypertension, your health care provider will measure your blood pressure while you are seated, with your arm held at the level of your heart. It should be measured at least twice using the same arm. Certain conditions can cause a difference in blood pressure between your right and left arms. A blood pressure reading that is higher than normal on one occasion does not mean that you  need treatment. If one blood pressure reading is high, ask your health care provider about having it checked again. TREATMENT  Treating high blood pressure includes making lifestyle changes and possibly taking medicine. Living a healthy lifestyle can help lower high blood pressure. You may need to change some of your habits. Lifestyle changes may include:  Following the DASH diet. This diet is high in fruits, vegetables, and whole grains. It is low in salt, red meat, and added sugars.  Getting at least 2 hours of brisk physical activity every week.  Losing weight if necessary.  Not smoking.  Limiting alcoholic beverages.  Learning ways to reduce stress. If lifestyle changes are not enough to get your blood pressure under control, your health care provider may prescribe medicine. You may need to take more than one. Work closely with your health care provider to understand the risks and benefits. HOME CARE INSTRUCTIONS  Have your blood pressure rechecked as directed by your health care provider.   Take medicines only as directed by your health care provider. Follow the directions carefully. Blood pressure medicines must be taken as prescribed. The medicine does not work as well when you skip doses. Skipping doses also puts you at risk for problems.   Do not smoke.   Monitor your blood pressure at home as directed by your health care provider. SEEK MEDICAL CARE IF:   You think you are having a reaction to medicines taken.  You have recurrent headaches or feel dizzy.  You have swelling in your ankles.  You have trouble with your vision. SEEK IMMEDIATE MEDICAL CARE IF:  You develop a severe headache or confusion.  You have unusual weakness, numbness, or feel faint.  You have severe chest or abdominal pain.  You vomit repeatedly.  You have trouble breathing. MAKE SURE YOU:   Understand these instructions.  Will watch your condition.  Will get help right away if you  are not doing well or get worse. Document Released: 10/29/2005 Document Revised: 03/15/2014 Document Reviewed: 08/21/2013 [facility] Patient Information 2015 [place], [place]. This information is not intended to replace advice given to you by your health care provider. Make sure you discuss any questions you have with your health care provider.  DASH Eating Plan DASH stands for "Dietary Approaches to Stop Hypertension." The DASH eating plan is a healthy eating plan that has been shown to reduce high blood pressure (hypertension). Additional health benefits may include reducing the risk of type 2 diabetes mellitus, heart disease, and stroke. The DASH eating plan may also help with weight loss. WHAT DO I NEED TO KNOW ABOUT THE DASH EATING PLAN? For the DASH eating plan, you will follow these general guidelines:  Choose foods with a percent daily value for sodium of less than 5% (as listed on the food label).  Use salt-free seasonings or herbs instead of table salt or sea salt.  Check with your health care provider or pharmacist before using salt substitutes.  Eat lower-sodium products, often labeled as "lower sodium" or "no salt added."  Eat fresh foods.  Eat more vegetables, fruits, and low-fat dairy products.  Choose whole grains. Look for the word "whole" as the first word in the ingredient list.  Choose fish and skinless chicken or [place] more often than red meat. Limit fish, poultry, and meat to 6 oz (170 g) each day.  Limit sweets, desserts, sugars, and sugary drinks.  Choose heart-healthy fats.  Limit cheese to 1 oz (28 g) per day.  Eat more home-cooked food and less restaurant, buffet, and fast food.  Limit fried foods.  Cook foods using methods other than frying.  Limit canned vegetables. If you do use them, rinse them well to decrease the sodium.  When eating at a restaurant, ask that your food be prepared with less salt, or no salt if possible. WHAT FOODS CAN I  EAT? Seek help from a dietitian for individual calorie needs. Grains Whole grain or whole wheat bread. Brown rice. Whole grain or whole wheat pasta. Quinoa, bulgur, and whole grain cereals. Low-sodium cereals. Corn or whole wheat flour tortillas. Whole grain cornbread. Whole grain crackers. Low-sodium crackers. Vegetables Fresh or frozen vegetables (raw, steamed, roasted, or grilled). Low-sodium or reduced-sodium tomato and vegetable juices. Low-sodium or reduced-sodium tomato sauce and paste. Low-sodium or reduced-sodium canned vegetables.  Fruits All fresh, canned (in natural juice), or frozen fruits. Meat and Other Protein Products Ground beef (85% or leaner), grass-fed beef, or beef trimmed of fat. Skinless chicken or [place]. Ground chicken or [place]. Pork trimmed of fat. All fish and seafood. Eggs. Dried beans, peas, or lentils. Unsalted nuts and seeds. Unsalted canned beans. Dairy Low-fat dairy products, such as skim or 1% milk, 2% or reduced-fat cheeses, low-fat ricotta or cottage cheese, or plain low-fat yogurt. Low-sodium or reduced-sodium cheeses. Fats and Oils Tub margarines without trans fats. Light or reduced-fat mayonnaise and salad dressings (reduced sodium). Avocado. Safflower, olive, or canola oils. Natural peanut or almond butter. Other Unsalted popcorn and pretzels. The items listed above may not be a complete list of recommended foods or beverages. Contact your dietitian for more  options. WHAT FOODS ARE NOT RECOMMENDED? Grains White bread. White pasta. White rice. Refined cornbread. Bagels and croissants. Crackers that contain trans fat. Vegetables Creamed or fried vegetables. Vegetables in a cheese sauce. Regular canned vegetables. Regular canned tomato sauce and paste. Regular tomato and vegetable juices. Fruits Dried fruits. Canned fruit in light or heavy syrup. Fruit juice. Meat and Other Protein Products Fatty cuts of meat. Ribs, chicken wings, bacon, sausage,  bologna, salami, chitterlings, fatback, hot dogs, bratwurst, and packaged luncheon meats. Salted nuts and seeds. Canned beans with salt. Dairy Whole or 2% milk, cream, half-and-half, and cream cheese. Whole-fat or sweetened yogurt. Full-fat cheeses or blue cheese. Nondairy creamers and whipped toppings. Processed cheese, cheese spreads, or cheese curds. Condiments Onion and garlic salt, seasoned salt, table salt, and sea salt. Canned and packaged gravies. Worcestershire sauce. Tartar sauce. Barbecue sauce. Teriyaki sauce. Soy sauce, including reduced sodium. Steak sauce. Fish sauce. Oyster sauce. Cocktail sauce. Horseradish. Ketchup and mustard. Meat flavorings and tenderizers. Bouillon cubes. Hot sauce. Tabasco sauce. Marinades. Taco seasonings. Relishes. Fats and Oils Butter, stick margarine, lard, shortening, ghee, and bacon fat. Coconut, palm kernel, or palm oils. Regular salad dressings. Other Pickles and olives. Salted popcorn and pretzels. The items listed above may not be a complete list of foods and beverages to avoid. Contact your dietitian for more information. WHERE CAN I FIND MORE INFORMATION? National Heart, Lung, and Blood Institute: travelstabloid.com Document Released: 10/18/2011 Document Revised: 03/15/2014 Document Reviewed: 09/02/2013 [facility] Patient Information 2015 [place], [place]. This information is not intended to replace advice given to you by your health care provider. Make sure you discuss any questions you have with your health care provider.

## 2014-11-06 NOTE — ED Notes (Signed)
BP high 200/160; [facility] did ekg and saw LVH. [facility] states, "bp different in each arm."? Left arm bp - 170/130; rt. Arm bp - 210/130. Asymptomatic. Been coughing a lot.

## 2014-11-06 NOTE — Progress Notes (Addendum)
Subjective:    Patient ID: Candice [name], female    DOB: January 04, 1961, 53 y.o.   MRN: [id number] This chart was scribed for Candice Chris, MD by Littie Deeds, Medical Scribe. This patient was seen in room 1 and the patient's care was started at 4:01 PM.   HPI HPI Comments: CARAN MILLSON is a 53 y.o. female with a hx of HTN for about 25 years who presents to the [facility] complaining of gradual onset URI symptoms that began about 4 weeks ago. Patient reports having chest congestion and minimally productive, persistent cough. She also has had a HA today due to cough as well as SOB only when climbing stairs that started 1 week ago. She has tried Mucinex for her symptoms. Patient sleeps on two pillows at night.   Her BP medications were changed a few weeks ago by Dr. Dareen Piano; they were also changed before then to see if the medications were causing photosensitivity in the patient. Patient was diagnosed with photosensitivity recently. Patient has not been to a blood pressure specialist.  PCP: Dr. Neva Seat  Review of Systems  HENT: Positive for congestion.   Respiratory: Positive for cough and shortness of breath.   Neurological: Positive for headaches.       Objective:   Physical Exam CONSTITUTIONAL: Well developed/well nourished HEAD: Normocephalic/atraumatic EYES: EOM/PERRL ENMT: Mucous membranes moist NECK: supple no meningeal signs SPINE: entire spine nontender CV: S1/S2 noted, no murmurs/rubs/gallops noted. Repeat BP: 210/130. LUNGS: Lungs are clear to auscultation bilaterally, no apparent distress ABDOMEN: soft, nontender, no rebound or guarding GU: no cva tenderness NEURO: Pt is awake/alert, moves all extremitiesx4 EXTREMITIES: pulses normal, full ROM SKIN: warm, color normal PSYCH: no abnormalities of mood noted EKG shows widening of the QRS with 20 mm aVL consistent with LVH [facility] reading (PRIMARY) by  Dr.Leonetta Mcgivern shows cardiomegaly no definite clear-cut  pulmonary edema or failure      Assessment & Plan:  I reviewed the blood pressure in and blood pressure reading was 220/130. Her history and exam are most consistent with diastolic dysfunction and hypertensive urgency. Patient will be transported to the hospital for emergency evaluation. Certainly d-dimer and troponin and BNP should be considered. If cardiology consult with echo could be done that would be beneficial. She has a history of photosensitivity and previous drug allergies.I personally performed the services described in this documentation, which was scribed in my presence. The recorded information has been reviewed and is accurate.

## 2014-11-10 ENCOUNTER — Ambulatory Visit (INDEPENDENT_AMBULATORY_CARE_PROVIDER_SITE_OTHER): Payer: BC Managed Care – PPO | Admitting: Family Medicine

## 2014-11-10 VITALS — BP 200/127 | HR 70 | Temp 98.2°F | Resp 18 | Ht 65.0 in | Wt 235.0 lb

## 2014-11-10 DIAGNOSIS — E876 Hypokalemia: Secondary | ICD-10-CM

## 2014-11-10 DIAGNOSIS — I10 Essential (primary) hypertension: Secondary | ICD-10-CM

## 2014-11-10 DIAGNOSIS — R0989 Other specified symptoms and signs involving the circulatory and respiratory systems: Secondary | ICD-10-CM

## 2014-11-10 DIAGNOSIS — D649 Anemia, unspecified: Secondary | ICD-10-CM

## 2014-11-10 LAB — POCT URINALYSIS DIPSTICK
Bilirubin, UA: NEGATIVE
Blood, UA: NEGATIVE
Glucose, UA: NEGATIVE
KETONES UA: NEGATIVE
Leukocytes, UA: NEGATIVE
NITRITE UA: NEGATIVE
PROTEIN UA: NEGATIVE
Spec Grav, UA: 1.01
UROBILINOGEN UA: 0.2
pH, UA: 7

## 2014-11-10 LAB — POCT CBC
GRANULOCYTE PERCENT: 53.3 % (ref 37–80)
HCT, POC: 33.6 % — AB (ref 37.7–47.9)
Hemoglobin: 10.6 g/dL — AB (ref 12.2–16.2)
Lymph, poc: 1.8 (ref 0.6–3.4)
MCH: 25.9 pg — AB (ref 27–31.2)
MCHC: 31.5 g/dL — AB (ref 31.8–35.4)
MCV: 82.1 fL (ref 80–97)
MID (CBC): 0.4 (ref 0–0.9)
MPV: 7.9 fL (ref 0–99.8)
POC Granulocyte: 2.6 (ref 2–6.9)
POC LYMPH PERCENT: 38.2 %L (ref 10–50)
POC MID %: 8.5 % (ref 0–12)
Platelet Count, POC: 246 10*3/uL (ref 142–424)
RBC: 4.09 M/uL (ref 4.04–5.48)
RDW, POC: 16.3 %
WBC: 4.8 10*3/uL (ref 4.6–10.2)

## 2014-11-10 MED ORDER — ATENOLOL-CHLORTHALIDONE 50-25 MG PO TABS: 1.0000 | ORAL_TABLET | Freq: Every day | ORAL | Status: DC

## 2014-11-10 NOTE — Progress Notes (Addendum)
This chart was scribed for [name], MD by Edison Simon, ED Scribe. This patient was seen in room 12 and the patient's care was started at 5:13 PM.   Subjective:    Patient ID: Candice [name], female    DOB: 03-25-1961, 53 y.o.   MRN: [id number]  Chief Complaint  Patient presents with  . Follow-up    htn    HPI  HPI Comments: Candice [name] is a 53 y.o. female with history of hypertension who presents to the [facility] for hypertension follow up. Have changed her blood pressure multiple time due to concern of photosensitivity and photodermatitis, see prior notes and telephone calls. I last saw her in May of this year for rash. At that time, discontinued chlorthalidone as concerned of photosensitivity, continued on atenolol only at 50mg  qd. She was seen in follow up by Dr. Ouida Sills and Dr. Joseph Art, atenolol was discontinued in June. See telephone notes on June 23, recommended she restart atenolol at 50mg  qd. Then was seen in follow up by Dr. Ouida Sills on December 5 for refill of this medication. Blood pressure at that time was 178/98 but was out of her medicatioin for 4 days at that time. She was seen by Dr. Everlene Farrier 4 days ago with URI symptoms but also with headache, SOB when climbing stairs for 1 week. Her blood pressure in the office was 220/130, diagnosed with diastolic dyscfunction and hypertensive urgency. She was seen in the ER, blood pressure 176/106, HGB 10.8, urine protein 100, other labs normal range including troponin and creatinine of 0.93 with eGFR of 79, BNP of 216, and potassium 3.4. Blood pressure had decreased to 177/90 in the ER from a high of 211/112 so discharged to primary care follow up.  She states she increased her dosage, as instructed by ER provider to full losartan 50mg  qd. She states her blood pressure readings via electronic wrist minotor have been fluctuating. On her wrist monitor, her blood pressure in the office was 195/124 on her right  arm; it measured 141/92 on the left wrist. She states it has been lower at 121/88 but jumps up at times. She states she has not seen a cardiologist in the past few years. In regards to her suspected photodermatitis -she states rash did not change when taken off chlorthalidone in June, and she requests to by put back on it with combo pill  because it was controlling her blood pressure well. . She states she restarted combination of Atenolol and chlorthalidone 50, 25mg  in April, but on atenolol only recently.   Has had anemia in the past and took iron pill years ago but none recently.  Patient suspects that initial blood pressure reading in office was wrong, but has noted blood pressure fluctuations at home too. She denies palpitations, tachycardia, diaphoresis, dizziness, SOB, or headache with blood pressure spikes or falls.    Patient Active Problem List   Diagnosis Date Noted  . Cardiomegaly 11/06/2014  . HTN (hypertension) 08/19/2013  . Gout 08/19/2013  . Allergic urticaria 06/23/2013  . Contact dermatitis 07/16/2012   Past Medical History  Diagnosis Date  . Hypertension   . Heart murmur   . Allergy    Past Surgical History  Procedure Laterality Date  . Ovarian cyst removal Left     20 yrs ago  . Tubal ligation     Allergies  Allergen Reactions  . Ace Inhibitors Nausea And Vomiting   Prior to Admission medications  Medication Sig Start Date End Date Taking? Authorizing Provider  atenolol (TENORMIN) 50 MG tablet Take 1 tablet (50 mg total) by mouth daily. 10/16/14  Yes Roselee Culver, MD  colchicine 0.6 MG tablet 1-2 tabs at onset of toe [name]. Take once per flair only. 08/19/13  Yes Wendie Agreste, MD  Emollient (EUCERIN) lotion Apply topically as needed for dry skin. 04/04/14  Yes Roselee Culver, MD  cyproheptadine (PERIACTIN) 4 MG tablet Take 1 tablet (4 mg total) by mouth 3 (three) times daily as needed for allergies. 04/04/14   Roselee Culver, MD  levocetirizine  (XYZAL) 5 MG tablet Take 5 mg by mouth every evening.    Historical Provider, MD  mupirocin ointment (BACTROBAN) 2 % Apply topically 3 (three) times daily. 08/19/13   Wendie Agreste, MD   History   Social History  . Marital Status: Divorced    Spouse Name: N/A    Number of Children: N/A  . Years of Education: N/A   Occupational History  . Not on file.   Social History Main Topics  . Smoking status: Never Smoker   . Smokeless tobacco: Not on file  . Alcohol Use: No  . Drug Use: No  . Sexual Activity: Yes    Birth Control/ Protection: Surgical   Other Topics Concern  . Not on file   Social History Narrative      Review of Systems  Constitutional: Negative for fatigue and unexpected weight change.  Respiratory: Negative for chest tightness and shortness of breath.   Cardiovascular: Negative for chest [name], palpitations and leg swelling.  Gastrointestinal: Negative for abdominal [name] and blood in stool.  Neurological: Negative for dizziness, syncope, light-headedness and headaches.       Objective:   Physical Exam  Constitutional: She is oriented to person, place, and time. She appears well-developed and well-nourished.  HENT:  Head: Normocephalic and atraumatic.  Eyes: Conjunctivae and EOM are normal. Pupils are equal, round, and reactive to light.  Neck: Carotid bruit is not present.  Cardiovascular: Normal rate, regular rhythm, normal heart sounds and intact distal pulses.   Radial pulses equal and 2+ bilaterally Hand veins flatten with arm elevation Neck veins flat  Pulmonary/Chest: Effort normal and breath sounds normal.  Abdominal: Soft. She exhibits no pulsatile midline mass. There is no tenderness.  Musculoskeletal:  No edema in arms No edema in legs  Neurological: She is alert and oriented to person, place, and time.  Skin: Skin is warm and dry.  Psychiatric: She has a normal mood and affect. Her behavior is normal.  Vitals reviewed.   Filed Vitals:     11/10/14 1652  BP: 130/84  Pulse: 70  Temp: 98.2 F (36.8 C)  TempSrc: Oral  Resp: 18  Height: $Remove'5\' 5"'pxxAquH$  (1.651 m)  Weight: 235 lb (106.595 kg)  SpO2: 98%      Measurement of blood pressures right arm vs left arm then also with patient's wrist blood pressure meter were preformed Left arm 180/120 (158/113 on patient's meter), right arm 212/120 (200/127 on patient's meter)  Results for orders placed or performed in visit on 11/10/14  POCT CBC  Result Value Ref Range   WBC 4.8 4.6 - 10.2 K/uL   Lymph, poc 1.8 0.6 - 3.4   POC LYMPH PERCENT 38.2 10 - 50 %L   MID (cbc) 0.4 0 - 0.9   POC MID % 8.5 0 - 12 %M   POC Granulocyte 2.6 2 - 6.9  Granulocyte percent 53.3 37 - 80 %G   RBC 4.09 4.04 - 5.48 M/uL   Hemoglobin 10.6 (A) 12.2 - 16.2 g/dL   HCT, POC 33.6 (A) 37.7 - 47.9 %   MCV 82.1 80 - 97 fL   MCH, POC 25.9 (A) 27 - 31.2 pg   MCHC 31.5 (A) 31.8 - 35.4 g/dL   RDW, POC 16.3 %   Platelet Count, POC 246 142 - 424 K/uL   MPV 7.9 0 - 99.8 fL  POCT urinalysis dipstick  Result Value Ref Range   Color, UA yellow    Clarity, UA clear    Glucose, UA neg    Bilirubin, UA neg    Ketones, UA neg    Spec Grav, UA 1.010    Blood, UA neg    pH, UA 7.0    Protein, UA neg    Urobilinogen, UA 0.2    Nitrite, UA neg    Leukocytes, UA Negative        Assessment & Plan:   Candice [name] is a 53 y.o. female Essential hypertension - Plan: POCT CBC, POCT urinalysis dipstick, Basic metabolic panel, Ambulatory referral to Cardiology, atenolol-chlorthalidone (TENORETIC 50) 50-25 MG per tablet, Labile hypertension - Plan: Ambulatory referral to Cardiology  -uncontrolled HTN, with labile component - initially concerning for possible pheochromocytoma, but denies other typical associated symptoms of palpitations, diaphoresis or headaches. Deferred 24 hour urine metanephrines/catecholamines, but this may be discussed at cardiology eval.  Also concerning for possible PAD or subclavian stenosis with  discrepanacy of R and L arm readings.  -will change to tenoretic 50/$RemoveBeforeD'25mg'nmuHcukNAJuTdd$  QD. likely will need higher dose, or additional agents, but asx with readings in office today and reported 573'U systolic at home.   -refer to cardiology for eval.  If not seen there in next 2 weeks- follow up with me with home readings. RTC and ER/911 precautions discussed.   Hypokalemia - Plan: POCT CBC, POCT urinalysis dipstick, Basic metabolic panel  -repeat BMP - borderline in ER  Anemia, unspecified anemia type - Plan: POCT CBC, POCT urinalysis dipstick, Basic metabolic panel, IBC Panel  -suspected iron deficiency with same in past.  will check iron studies, start otc iron supplement or increase iron containing foods and recheck levels in next 2-3 months.    Meds ordered this encounter  Medications  . Dextromethorphan-Guaifenesin (CORICIDIN HBP CONGESTION/COUGH PO)    Sig: Take by mouth.  Marland Kitchen atenolol-chlorthalidone (TENORETIC 50) 50-25 MG per tablet    Sig: Take 1 tablet by mouth daily.    Dispense:  30 tablet    Refill:  1   Patient Instructions  No protein on urine today.  Still anemic, but stable - increase iron containing foods or over the counter iron once per day and recheck levels in next 2-3 months.  Start tenoretic once per day for blood pressure for now - will likely need to increase your medications, but as some lower readings will start with this dose for now. Keep a record of your blood pressures outside of the office and bring them to the next office visit.  We will refer you to cardiology, but if not seen there in next 2 weeks - return to recheck with me.   Return to the clinic or go to the nearest emergency room if any of your symptoms worsen or new symptoms occur.        I personally performed the services described in this documentation, which was scribed in my presence.  The recorded information has been reviewed and considered, and addended by me as needed.

## 2014-11-10 NOTE — Patient Instructions (Signed)
No protein on urine today.  Still anemic, but stable - increase iron containing foods or over the counter iron once per day and recheck levels in next 2-3 months.  Start tenoretic once per day for blood pressure for now - will likely need to increase your medications, but as some lower readings will start with this dose for now. Keep a record of your blood pressures outside of the office and bring them to the next office visit.  We will refer you to cardiology, but if not seen there in next 2 weeks - return to recheck with me.   Return to the clinic or go to the nearest emergency room if any of your symptoms worsen or new symptoms occur.

## 2014-11-11 LAB — BASIC METABOLIC PANEL
BUN: 11 mg/dL (ref 6–23)
CO2: 27 mEq/L (ref 19–32)
Calcium: 9.4 mg/dL (ref 8.4–10.5)
Chloride: 103 mEq/L (ref 96–112)
Creat: 0.92 mg/dL (ref 0.50–1.10)
Glucose, Bld: 82 mg/dL (ref 70–99)
POTASSIUM: 4.2 meq/L (ref 3.5–5.3)
SODIUM: 139 meq/L (ref 135–145)

## 2014-11-14 LAB — IRON AND TIBC
%SAT: 19 % — ABNORMAL LOW (ref 20–55)
IRON: 45 ug/dL (ref 42–145)
TIBC: 237 ug/dL — ABNORMAL LOW (ref 250–470)
UIBC: 192 ug/dL (ref 125–400)

## 2015-01-07 ENCOUNTER — Other Ambulatory Visit: Payer: Self-pay | Admitting: Family Medicine

## 2015-01-19 ENCOUNTER — Other Ambulatory Visit: Payer: Self-pay | Admitting: Cardiology

## 2015-01-19 DIAGNOSIS — Q251 Coarctation of aorta: Secondary | ICD-10-CM

## 2015-01-20 ENCOUNTER — Other Ambulatory Visit: Payer: Self-pay | Admitting: Family Medicine

## 2015-02-04 ENCOUNTER — Inpatient Hospital Stay: Admission: RE | Admit: 2015-02-04 | Payer: Self-pay | Source: Ambulatory Visit

## 2015-04-08 ENCOUNTER — Encounter: Payer: Self-pay | Admitting: *Deleted

## 2015-05-12 ENCOUNTER — Encounter: Payer: Self-pay | Admitting: *Deleted

## 2015-05-23 ENCOUNTER — Telehealth: Payer: Self-pay | Admitting: *Deleted

## 2015-05-23 DIAGNOSIS — I10 Essential (primary) hypertension: Secondary | ICD-10-CM

## 2015-05-23 DIAGNOSIS — Z1231 Encounter for screening mammogram for malignant neoplasm of breast: Secondary | ICD-10-CM

## 2015-05-23 NOTE — Telephone Encounter (Signed)
Patient phoned in response to my mammo reminder letter - wanting to schedule mammo and schedule her annual routine physical with PCP - earliest available was 09/05/15/ @ 1600.  Patient also needs Valsartan 160 mg PO daily refilled (prescribed by cardiologist) and her Tenoretic 50-25 mg PO daily.  The Valsartan is not listed in her MAR.     Please advise.   Her contact info: [phone] [facility] [address] is preferred pharmacy

## 2015-05-23 NOTE — Telephone Encounter (Signed)
More info please. I referred her to cardiology for HTN in 10/2014. Are they not refilling BP meds from their office? I can refill if needed until follow up, but referred to cardiology due to high readings then.  Thanks.

## 2015-05-24 ENCOUNTER — Other Ambulatory Visit: Payer: Self-pay

## 2015-05-24 DIAGNOSIS — Z1231 Encounter for screening mammogram for malignant neoplasm of breast: Secondary | ICD-10-CM

## 2015-05-24 NOTE — Telephone Encounter (Signed)
Spoke with pt, she states the cardiologist will not fill the medication until she comes in for an office visit. She states she did go to the cardiologist but did not think it was necessary to keep going to them for refills on the prescriptions. She made an appt with Dr. Carlota Raspberry in October. The medication is working really well on her blood pressure. Can we refill until her appt or does she have to come in sooner? Please advise.

## 2015-05-25 MED ORDER — VALSARTAN 160 MG PO TABS: 160.0000 mg | ORAL_TABLET | Freq: Every day | ORAL | Status: DC

## 2015-05-25 MED ORDER — ATENOLOL-CHLORTHALIDONE 50-25 MG PO TABS: 1.0000 | ORAL_TABLET | Freq: Every day | ORAL | Status: DC

## 2015-05-25 NOTE — Telephone Encounter (Signed)
I can refill for next month, but should follow up with me or cardiologist in that time Candice [name] if they won't refill without office visit, then typically overdue for follow up or labs needed).  I can check labs at visit if needed, but should not wait until October for this. She can call to see if cancellation in my appt schedule in next few weeks or walk in to 102.  Sent listed meds in, but make sure these match what she was prescribed by cardiology. Thanks.

## 2015-05-26 NOTE — Telephone Encounter (Signed)
Called pt, advised message from Dr. Carlota Raspberry. Pt understood and will come into the walk in center.

## 2015-05-30 ENCOUNTER — Ambulatory Visit (INDEPENDENT_AMBULATORY_CARE_PROVIDER_SITE_OTHER): Payer: BC Managed Care – PPO | Admitting: Family Medicine

## 2015-05-30 VITALS — BP 106/74 | HR 62 | Temp 97.8°F | Resp 16 | Ht 65.0 in | Wt 237.0 lb

## 2015-05-30 DIAGNOSIS — I10 Essential (primary) hypertension: Secondary | ICD-10-CM | POA: Diagnosis not present

## 2015-05-30 DIAGNOSIS — L568 Other specified acute skin changes due to ultraviolet radiation: Secondary | ICD-10-CM

## 2015-05-30 LAB — BASIC METABOLIC PANEL
BUN: 16 mg/dL (ref 6–23)
CALCIUM: 9.7 mg/dL (ref 8.4–10.5)
CO2: 26 meq/L (ref 19–32)
Chloride: 102 mEq/L (ref 96–112)
Creat: 0.95 mg/dL (ref 0.50–1.10)
GLUCOSE: 90 mg/dL (ref 70–99)
Potassium: 3.7 mEq/L (ref 3.5–5.3)
Sodium: 139 mEq/L (ref 135–145)

## 2015-05-30 MED ORDER — ATENOLOL-CHLORTHALIDONE 50-25 MG PO TABS: 1.0000 | ORAL_TABLET | Freq: Every day | ORAL | Status: DC

## 2015-05-30 MED ORDER — VALSARTAN 160 MG PO TABS: 160.0000 mg | ORAL_TABLET | Freq: Every day | ORAL | Status: DC

## 2015-05-30 MED ORDER — TRIAMCINOLONE ACETONIDE 0.1 % EX OINT: 1.0000 "application " | TOPICAL_OINTMENT | CUTANEOUS | Status: DC | PRN

## 2015-05-30 NOTE — Progress Notes (Signed)
Subjective:    Patient ID: Candice [name], female    DOB: 11-Jul-1961, 54 y.o.   MRN: [id number] This chart was scribed for Candice Ray, MD by Zola Button, Medical Scribe. This patient was seen in Room 3 and the patient's care was started at 11:41 AM.   HPI HPI Comments: [name] is a 54 y.o. female with a history of hypertension who presents to the [facility] for a follow-up for hypertension. Last seen in December, 2015. Markedly elevated blood pressure last year, including up to 220/130. When seen for URI symptoms, she was diagnosed with diastolic dysfunction, hypertensive urgency, seen in the emergency room. Normal troponin and BNP of 216. Blood pressure at that visit with me initially 130/83, then 180/120 followed by 212/120. Diagnosed with uncontrolled hypertension with a labile component. Sent to cardiology, Dr. Einar Gip, for eval with possible concern for pheochromocytoma. See telephone note from last week. Here for follow-up for hypertension.   At her last visit with Dr. Einar Gip, she was recommended to have a CT scan, but patient has not been able to afford it. She had an echocardiogram on January 21st which showed grade II diastolic dysfunction, EF 39% and probably patent foramen ovale. Last visit with cardiology was March 9th. She had normal lipids and TSH. Still had a discrepancy between right and left arm blood pressures. Suspected possible coarctation of teh aorta which was the reason for CT scan of chest. Plan on follow-up after CT scan. Patient denies chest [name], SOB, lightheadedness, dizziness, blurred vision and headache.  History of photodermatitis, was referred to dermatology. She was put on triamcinolone prn for occasional rash. Patient is also on Zyrtec 10 mg qd. She does have some itchiness with heat or humidity.  Patient Active Problem List   Diagnosis Date Noted  . Cardiomegaly 11/06/2014  . HTN (hypertension) 08/19/2013  . Gout 08/19/2013  .  Allergic urticaria 06/23/2013  . Contact dermatitis 07/16/2012   Past Medical History  Diagnosis Date  . Hypertension   . Heart murmur   . Allergy    Past Surgical History  Procedure Laterality Date  . Ovarian cyst removal Left     20 yrs ago  . Tubal ligation     Allergies  Allergen Reactions  . Ace Inhibitors Nausea And Vomiting   Prior to Admission medications   Medication Sig Start Date End Date Taking? Authorizing Provider  atenolol-chlorthalidone (TENORETIC) 50-25 MG per tablet Take 1 tablet by mouth daily. 05/25/15  Yes Wendie Agreste, MD  colchicine 0.6 MG tablet 1-2 tabs at onset of toe [name]. Take once per flair only. 08/19/13  Yes Wendie Agreste, MD  Dextromethorphan-Guaifenesin (CORICIDIN HBP CONGESTION/COUGH PO) Take by mouth.   Yes Historical Provider, MD  Emollient (EUCERIN) lotion Apply topically as needed for dry skin. 04/04/14  Yes Roselee Culver, MD  levocetirizine (XYZAL) 5 MG tablet Take 5 mg by mouth every evening.   Yes Historical Provider, MD  triamcinolone ointment (KENALOG) 0.1 % Apply 1 application topically as needed.   Yes Historical Provider, MD  valsartan (DIOVAN) 160 MG tablet Take 1 tablet (160 mg total) by mouth daily. 05/25/15  Yes Wendie Agreste, MD   History   Social History  . Marital Status: Divorced    Spouse Name: N/A  . Number of Children: N/A  . Years of Education: N/A   Occupational History  . Not on file.   Social History Main Topics  . Smoking  status: Never Smoker   . Smokeless tobacco: Not on file  . Alcohol Use: No  . Drug Use: No  . Sexual Activity: Yes    Birth Control/ Protection: Surgical   Other Topics Concern  . Not on file   Social History Narrative     Review of Systems  Constitutional: Negative for fatigue and unexpected weight change.  Respiratory: Negative for chest tightness and shortness of breath.   Cardiovascular: Negative for chest [name], palpitations and leg swelling.  Gastrointestinal:  Negative for abdominal [name] and blood in stool.  Skin: Positive for rash.  Neurological: Negative for dizziness, syncope, light-headedness and headaches.       Objective:   Physical Exam  Constitutional: She is oriented to person, place, and time. She appears well-developed and well-nourished.  HENT:  Head: Normocephalic and atraumatic.  Eyes: Conjunctivae and EOM are normal. Pupils are equal, round, and reactive to light.  Neck: Carotid bruit is not present.  Cardiovascular: Normal rate, regular rhythm, normal heart sounds and intact distal pulses.   Pulmonary/Chest: Effort normal and breath sounds normal.  Abdominal: Soft. She exhibits no pulsatile midline mass. There is no tenderness.  Neurological: She is alert and oriented to person, place, and time.  Skin: Skin is warm and dry.  Psychiatric: She has a normal mood and affect. Her behavior is normal.  Vitals reviewed.     Filed Vitals:   05/30/15 1125  BP: 120/78  Pulse: 62  Temp: 97.8 F (36.6 C)  TempSrc: Oral  Resp: 16  Height: 5\' 5"  (1.651 m)  Weight: 237 lb (107.502 kg)  SpO2: 99%         Assessment & Plan:   MELBA ARAKI is a 54 y.o. female Essential hypertension - Plan: Basic metabolic panel, valsartan (DIOVAN) 160 MG tablet, atenolol-chlorthalidone (TENORETIC) 50-25 MG per tablet  - Controlled in office but still appears to be some slight discrepancy on right versus left readings. As was suggested possibility of coarctation of the aorta as discussed at cardiologist visit. Plan for CT of chest, but financially unable to do so at this time, but she does plan on doing this as soon as she is able to do so. Asymptomatic otherwise, so we'll continue same dose of medications. Refilled for 6 months. Check BMP.  Photodermatitis due to sun - Plan: triamcinolone ointment (KENALOG) 0.1 %  - controlled with episodic use of triamcinolone and trigger avoidance.  Continue Kenalog as needed -  Refill today.  Meds ordered  this encounter  Medications  . DISCONTD: triamcinolone ointment (KENALOG) 0.1 %    Sig: Apply 1 application topically as needed.  . cetirizine (ZYRTEC) 10 MG tablet    Sig: Take 10 mg by mouth daily.  Marland Kitchen triamcinolone ointment (KENALOG) 0.1 %    Sig: Apply 1 application topically as needed.    Dispense:  30 g    Refill:  1  . valsartan (DIOVAN) 160 MG tablet    Sig: Take 1 tablet (160 mg total) by mouth daily.    Dispense:  90 tablet    Refill:  1  . atenolol-chlorthalidone (TENORETIC) 50-25 MG per tablet    Sig: Take 1 tablet by mouth daily.    Dispense:  90 tablet    Refill:  1   Patient Instructions   Blood pressure overall okay today, continue same medications at this time. Keep an eye on your blood pressures at home and if running over 140/90 should follow-up with myself or cardiologist.  CT scan when you're able to as recommended by Dr. Einar Gip,  as your blood pressures are still different in each arm today suggesting possible coarctation of the aorta.  You should receive a call or letter about your lab results within the next week to 10 days. Return to the clinic or go to the nearest emergency room if any of your symptoms worsen or new symptoms occur.     I personally performed the services described in this documentation, which was scribed in my presence. The recorded information has been reviewed and considered, and addended by me as needed.

## 2015-05-30 NOTE — Patient Instructions (Addendum)
Blood pressure overall okay today, continue same medications at this time. Keep an eye on your blood pressures at home and if running over 140/90 should follow-up with myself or cardiologist. CT scan when you're able to as recommended by Dr. Einar Gip,  as your blood pressures are still different in each arm today suggesting possible coarctation of the aorta.  You should receive a call or letter about your lab results within the next week to 10 days. Return to the clinic or go to the nearest emergency room if any of your symptoms worsen or new symptoms occur.

## 2015-06-01 ENCOUNTER — Ambulatory Visit (HOSPITAL_COMMUNITY)
Admission: RE | Admit: 2015-06-01 | Discharge: 2015-06-01 | Disposition: A | Discharge status: Home / Self Care | Payer: BC Managed Care – PPO | Source: Ambulatory Visit | Attending: Family Medicine | Admitting: Family Medicine

## 2015-06-01 DIAGNOSIS — Z1231 Encounter for screening mammogram for malignant neoplasm of breast: Secondary | ICD-10-CM | POA: Insufficient documentation

## 2015-06-01 LAB — HM MAMMOGRAPHY

## 2015-06-04 ENCOUNTER — Encounter: Payer: Self-pay | Admitting: Family Medicine

## 2015-06-07 ENCOUNTER — Encounter: Payer: Self-pay | Admitting: *Deleted

## 2015-07-05 ENCOUNTER — Telehealth: Payer: Self-pay | Admitting: *Deleted

## 2015-07-05 NOTE — Telephone Encounter (Signed)
Phoned patient to reschedule patient's CPE for 10/24 as instructed by Mable Fill (scheduling conflict), and the patient stated that during her 05/30/15 OV with PCP, he told her to [facility] that appointment as scheduled.

## 2015-07-06 NOTE — Telephone Encounter (Signed)
Attempted to reach patient to reschedule as instructed by JJ, no answer, LM detailing to patient that her appt time was still the same day, but at 845 am versus 4 pm and if that was not acceptable, to please contact me.  The 4 pm slot was cancelled.

## 2015-09-05 ENCOUNTER — Encounter: Payer: Self-pay | Admitting: Family Medicine

## 2016-01-02 ENCOUNTER — Ambulatory Visit (INDEPENDENT_AMBULATORY_CARE_PROVIDER_SITE_OTHER): Payer: BC Managed Care – PPO | Admitting: Family Medicine

## 2016-01-02 VITALS — BP 104/72 | HR 51 | Temp 99.0°F | Resp 18 | Ht 64.5 in | Wt 208.8 lb

## 2016-01-02 DIAGNOSIS — I10 Essential (primary) hypertension: Secondary | ICD-10-CM | POA: Diagnosis not present

## 2016-01-02 DIAGNOSIS — R748 Abnormal levels of other serum enzymes: Secondary | ICD-10-CM

## 2016-01-02 DIAGNOSIS — J111 Influenza due to unidentified influenza virus with other respiratory manifestations: Secondary | ICD-10-CM | POA: Diagnosis not present

## 2016-01-02 DIAGNOSIS — L501 Idiopathic urticaria: Secondary | ICD-10-CM | POA: Diagnosis not present

## 2016-01-02 DIAGNOSIS — R05 Cough: Secondary | ICD-10-CM

## 2016-01-02 DIAGNOSIS — L578 Other skin changes due to chronic exposure to nonionizing radiation: Secondary | ICD-10-CM | POA: Diagnosis not present

## 2016-01-02 DIAGNOSIS — J069 Acute upper respiratory infection, unspecified: Secondary | ICD-10-CM

## 2016-01-02 DIAGNOSIS — R69 Illness, unspecified: Secondary | ICD-10-CM

## 2016-01-02 DIAGNOSIS — R059 Cough, unspecified: Secondary | ICD-10-CM

## 2016-01-02 DIAGNOSIS — R7989 Other specified abnormal findings of blood chemistry: Secondary | ICD-10-CM

## 2016-01-02 MED ORDER — ATENOLOL-CHLORTHALIDONE 50-25 MG PO TABS: 1.0000 | ORAL_TABLET | Freq: Every day | ORAL | Status: DC

## 2016-01-02 MED ORDER — LEVOCETIRIZINE DIHYDROCHLORIDE 5 MG PO TABS: 5.0000 mg | ORAL_TABLET | Freq: Every evening | ORAL | Status: DC

## 2016-01-02 MED ORDER — HYDROCODONE-HOMATROPINE 5-1.5 MG/5ML PO SYRP: ORAL_SOLUTION | ORAL | Status: DC

## 2016-01-02 MED ORDER — VALSARTAN 160 MG PO TABS: 160.0000 mg | ORAL_TABLET | Freq: Every day | ORAL | Status: DC

## 2016-01-02 NOTE — Progress Notes (Signed)
Subjective:  By signing my name below, I, Moises Blood, attest that this documentation has been prepared under the direction and in the presence of [name], MD. Electronically Signed: Moises Blood, [place]. 01/02/2016 , 4:22 PM .  Patient was seen in Room 12 .   Patient ID: Candice [name], female    DOB: [birth year], 55 y.o.   MRN: [id number] Chief Complaint  Patient presents with  . Cough    sneezing; runny nose; congestion x 5 days  . Medication Refill    atenolol; diovan   HPI Candice [name] is a 55 y.o. female  Here for multiple concerns including medications refill and acute illness.   Congestion and cough She states that her cough and congestion started 5 nights ago. She's also been having chills and headache. Her granddaughter was visiting this past weekend, and had cough and cold. She's taken cough medication (coricidin) and ibuprofen without much relief. She feels worse if she doesn't take the cough medication. She denies receiving a flu shot this year. She denies shortness of breath.   She's been waking up at night due to the coughs. She has borderline temperature today in the office (99 F).   HTN She denies side effects complications with her medications. She denies lightheadedness and dizziness.  She's taking atenolol-chlorthalidone 50-25mg  qd.  She's also taking valsartan 160mg  qd.   Lab Results  Component Value Date   CREATININE 0.95 05/30/2015   Allergy H/o photodermatitis, having itchy hives. See multiple prior visits for this. She was previously taking xyzal.  She usually covers up, but noticed some exposure to the sun recently. She's been taking OTC zyrtec.   Patient Active Problem List   Diagnosis Date Noted  . Cardiomegaly 11/06/2014  . HTN (hypertension) 08/19/2013  . Gout 08/19/2013  . Allergic urticaria 06/23/2013  . Contact dermatitis 07/16/2012   Past Medical History  Diagnosis Date  . Hypertension   . Heart murmur   . Allergy     Past Surgical History  Procedure Laterality Date  . Ovarian cyst removal Left     20 yrs ago  . Tubal ligation     Allergies  Allergen Reactions  . Ace Inhibitors Nausea And Vomiting   Prior to Admission medications   Medication Sig Start Date End Date Taking? Authorizing Provider  atenolol-chlorthalidone (TENORETIC) 50-25 MG per tablet Take 1 tablet by mouth daily. 05/30/15   Wendie Agreste, MD  cetirizine (ZYRTEC) 10 MG tablet Take 10 mg by mouth daily.    Historical Provider, MD  colchicine 0.6 MG tablet 1-2 tabs at onset of toe pain. Take once per flair only. 08/19/13   Wendie Agreste, MD  Emollient (EUCERIN) lotion Apply topically as needed for dry skin. 04/04/14   Roselee Culver, MD  triamcinolone ointment (KENALOG) 0.1 % Apply 1 application topically as needed. 05/30/15   Wendie Agreste, MD  valsartan (DIOVAN) 160 MG tablet Take 1 tablet (160 mg total) by mouth daily. 05/30/15   Wendie Agreste, MD   Social History   Social History  . Marital Status: Divorced    Spouse Name: N/A  . Number of Children: N/A  . Years of Education: N/A   Occupational History  . Not on file.   Social History Main Topics  . Smoking status: Never Smoker   . Smokeless tobacco: Not on file  . Alcohol Use: No  . Drug Use: No  . Sexual Activity: Yes    Birth Control/  Protection: Surgical   Other Topics Concern  . Not on file   Social History Narrative   Review of Systems  Constitutional: Positive for chills. Negative for fatigue and unexpected weight change.  HENT: Positive for congestion.   Respiratory: Positive for cough. Negative for chest tightness and shortness of breath.   Cardiovascular: Negative for chest pain, palpitations and leg swelling.  Gastrointestinal: Negative for abdominal pain and blood in stool.  Neurological: Positive for headaches. Negative for dizziness, syncope and light-headedness.       Objective:   Physical Exam  Constitutional: She is oriented  to person, place, and time. She appears well-developed and well-nourished. No distress.  HENT:  Head: Normocephalic and atraumatic.  Right Ear: Hearing, tympanic membrane, external ear and ear canal normal.  Left Ear: Hearing, tympanic membrane, external ear and ear canal normal.  Nose: Nose normal.  Mouth/Throat: Oropharynx is clear and moist. No oropharyngeal exudate.  Eyes: Conjunctivae and EOM are normal. Pupils are equal, round, and reactive to light.  Neck: Carotid bruit is not present.  Cardiovascular: Normal rate, regular rhythm, normal heart sounds and intact distal pulses.   No murmur heard. Pulmonary/Chest: Effort normal and breath sounds normal. No respiratory distress. She has no wheezes. She has no rhonchi.  Abdominal: Soft. She exhibits no pulsatile midline mass. There is no tenderness.  Neurological: She is alert and oriented to person, place, and time.  Skin: Skin is warm and dry. No rash noted.  Psychiatric: She has a normal mood and affect. Her behavior is normal.  Vitals reviewed.   Filed Vitals:   01/02/16 1454 01/02/16 1608  BP: 104/72   Pulse: 51   Temp: 99 F (37.2 C)   TempSrc: Oral   Resp: 18   Height: 5' 4.5" (1.638 m)   Weight: 208 lb 12.8 oz (94.711 kg)   SpO2: 93% 96%      Assessment & Plan:   Candice [name] is a 55 y.o. female Essential hypertension - Plan: atenolol-chlorthalidone (TENORETIC) 50-25 MG tablet, Basic metabolic panel, valsartan (DIOVAN) 160 MG tablet  - Stable, no change in meds for now. Refilled meds, blood work pending.   Idiopathic urticaria - Plan: levocetirizine (XYZAL) 5 MG tablet Solar dermatitis - Plan: levocetirizine (XYZAL) 5 MG tablet  - Can try switching to Xyzal she had better relief with this and Zyrtec in the past. For advised that if not covered, would switch back to Zyrtec. Has topical steroid to use if flare, RTC precautions discussed.  Acute upper respiratory infection Influenza-like illness - Plan:  HYDROcodone-homatropine (HYCODAN) 5-1.5 MG/5ML syrup Cough - Plan: HYDROcodone-homatropine (HYCODAN) 5-1.5 MG/5ML syrup  -Possible influenza or influenza-like illness, now 5 days into symptoms.  -Continue symptomatic care with Mucinex or Coricidin HBP. Fluids, and RTC precautions discussed.  -Hycodan syrup prescribed if needed at nighttime. Side effects discussed.  Meds ordered this encounter  Medications  . levocetirizine (XYZAL) 5 MG tablet    Sig: Take 1 tablet (5 mg total) by mouth every evening.    Dispense:  30 tablet    Refill:  11  . HYDROcodone-homatropine (HYCODAN) 5-1.5 MG/5ML syrup    Sig: 21m by mouth a bedtime as needed for cough.    Dispense:  120 mL    Refill:  0  . atenolol-chlorthalidone (TENORETIC) 50-25 MG tablet    Sig: Take 1 tablet by mouth daily.    Dispense:  90 tablet    Refill:  1  . valsartan (DIOVAN) 160 MG tablet  Sig: Take 1 tablet (160 mg total) by mouth daily.    Dispense:  90 tablet    Refill:  1   Patient Instructions  You should receive a call or letter about your lab results within the next week to 10 days.   For the sun exposure rash, can try switching to Xyzal once per day. If this is not covered though, return to Zyrtec once per day. If flare of symptoms that are not improving with the cream prescribed by dermatology, return for recheck.   I suspect you have had the flu, or a flulike illness. As you're 5 days into this at this time, further testing is not needed at this point. Continue fluids, rest, and Mucinex or Coricidin HBP during the day, cough syrup to take at night if needed. This does cause sedation so do not drive or operate machinery while taking this medicine. If you start to run higher fevers, short of breath, or worsening symptoms, return for recheck.   The pressure looks okay today, no changes in medicines for now.  Schedule physical in the next 6 months.   Influenza, Adult Influenza ("the flu") is a viral infection of the  respiratory tract. It occurs more often in winter months because people spend more time in close contact with one another. Influenza can make you feel very sick. Influenza easily spreads from person to person (contagious). CAUSES  Influenza is caused by a virus that infects the respiratory tract. You can catch the virus by breathing in droplets from an infected person's cough or sneeze. You can also catch the virus by touching something that was recently contaminated with the virus and then touching your mouth, nose, or eyes. RISKS AND COMPLICATIONS You may be at risk for a more severe case of influenza if you smoke cigarettes, have diabetes, have chronic heart disease (such as heart failure) or lung disease (such as asthma), or if you have a weakened immune system. Elderly people and pregnant women are also at risk for more serious infections. The most common problem of influenza is a lung infection (pneumonia). Sometimes, this problem can require emergency medical care and may be life threatening. SIGNS AND SYMPTOMS  Symptoms typically last 4 to 10 days and may include:  Fever.  Chills.  Headache, body aches, and muscle aches.  Sore throat.  Chest discomfort and cough.  Poor appetite.  Weakness or feeling tired.  Dizziness.  Nausea or vomiting. DIAGNOSIS  Diagnosis of influenza is often made based on your history and a physical exam. A nose or throat swab test can be done to confirm the diagnosis. TREATMENT  In mild cases, influenza goes away on its own. Treatment is directed at relieving symptoms. For more severe cases, your health care provider may prescribe antiviral medicines to shorten the sickness. Antibiotic medicines are not effective because the infection is caused by a virus, not by bacteria. HOME CARE INSTRUCTIONS  Take medicines only as directed by your health care provider.  Use a cool mist humidifier to make breathing easier.  Get plenty of rest until your  temperature returns to normal. This usually takes 3 to 4 days.  Drink enough fluid to keep your urine clear or pale yellow.  Cover yourmouth and nosewhen coughing or sneezing,and wash your handswellto prevent thevirusfrom spreading.  Stay homefromwork orschool untilthe fever is gonefor at least 17full day. PREVENTION  An annual influenza vaccination (flu shot) is the best way to avoid getting influenza. An annual flu shot is  now routinely recommended for all adults in the [place] IF:  You experiencechest pain, yourcough worsens,or you producemore mucus.  Youhave nausea,vomiting, ordiarrhea.  Your fever returns or gets worse. SEEK IMMEDIATE MEDICAL CARE IF:  You havetrouble breathing, you become short of breath,or your skin ornails becomebluish.  You have severe painor stiffnessin the neck.  You develop a sudden headache, or pain in the face or ear.  You have nausea or vomiting that you cannot control. MAKE SURE YOU:   Understand these instructions.  Will watch your condition.  Will get help right away if you are not doing well or get worse.   This information is not intended to replace advice given to you by your health care provider. Make sure you discuss any questions you have with your health care provider.   Document Released: 10/26/2000 Document Revised: 11/19/2014 Document Reviewed: 01/28/2012 Elsevier Interactive Patient Education [employer].     I personally performed the services described in this documentation, which was scribed in my presence. The recorded information has been reviewed and considered, and addended by me as needed.

## 2016-01-02 NOTE — Patient Instructions (Signed)
You should receive a call or letter about your lab results within the next week to 10 days.   For the sun exposure rash, can try switching to Xyzal once per day. If this is not covered though, return to Zyrtec once per day. If flare of symptoms that are not improving with the cream prescribed by dermatology, return for recheck.   I suspect you have had the flu, or a flulike illness. As you're 5 days into this at this time, further testing is not needed at this point. Continue fluids, rest, and Mucinex or Coricidin HBP during the day, cough syrup to take at night if needed. This does cause sedation so do not drive or operate machinery while taking this medicine. If you start to run higher fevers, short of breath, or worsening symptoms, return for recheck.   The pressure looks okay today, no changes in medicines for now.  Schedule physical in the next 6 months.   Influenza, Adult Influenza ("the flu") is a viral infection of the respiratory tract. It occurs more often in winter months because people spend more time in close contact with one another. Influenza can make you feel very sick. Influenza easily spreads from person to person (contagious). CAUSES  Influenza is caused by a virus that infects the respiratory tract. You can catch the virus by breathing in droplets from an infected person's cough or sneeze. You can also catch the virus by touching something that was recently contaminated with the virus and then touching your mouth, nose, or eyes. RISKS AND COMPLICATIONS You may be at risk for a more severe case of influenza if you smoke cigarettes, have diabetes, have chronic heart disease (such as heart failure) or lung disease (such as asthma), or if you have a weakened immune system. Elderly people and pregnant women are also at risk for more serious infections. The most common problem of influenza is a lung infection (pneumonia). Sometimes, this problem can require emergency medical care and  may be life threatening. SIGNS AND SYMPTOMS  Symptoms typically last 4 to 10 days and may include:  Fever.  Chills.  Headache, body aches, and muscle aches.  Sore throat.  Chest discomfort and cough.  Poor appetite.  Weakness or feeling tired.  Dizziness.  Nausea or vomiting. DIAGNOSIS  Diagnosis of influenza is often made based on your history and a physical exam. A nose or throat swab test can be done to confirm the diagnosis. TREATMENT  In mild cases, influenza goes away on its own. Treatment is directed at relieving symptoms. For more severe cases, your health care provider may prescribe antiviral medicines to shorten the sickness. Antibiotic medicines are not effective because the infection is caused by a virus, not by bacteria. HOME CARE INSTRUCTIONS  Take medicines only as directed by your health care provider.  Use a cool mist humidifier to make breathing easier.  Get plenty of rest until your temperature returns to normal. This usually takes 3 to 4 days.  Drink enough fluid to keep your urine clear or pale yellow.  Cover yourmouth and nosewhen coughing or sneezing,and wash your handswellto prevent thevirusfrom spreading.  Stay homefromwork orschool untilthe fever is gonefor at least 40full day. PREVENTION  An annual influenza vaccination (flu shot) is the best way to avoid getting influenza. An annual flu shot is now routinely recommended for all adults in the [place] IF:  You experiencechest pain, yourcough worsens,or you producemore mucus.  Youhave nausea,vomiting, ordiarrhea.  Your fever  returns or gets worse. SEEK IMMEDIATE MEDICAL CARE IF:  You havetrouble breathing, you become short of breath,or your skin ornails becomebluish.  You have severe painor stiffnessin the neck.  You develop a sudden headache, or pain in the face or ear.  You have nausea or vomiting that you cannot control. MAKE SURE YOU:    Understand these instructions.  Will watch your condition.  Will get help right away if you are not doing well or get worse.   This information is not intended to replace advice given to you by your health care provider. Make sure you discuss any questions you have with your health care provider.   Document Released: 10/26/2000 Document Revised: 11/19/2014 Document Reviewed: 01/28/2012 Elsevier Interactive Patient Education [employer].

## 2016-01-03 LAB — BASIC METABOLIC PANEL
BUN: 30 mg/dL — AB (ref 7–25)
CO2: 25 mmol/L (ref 20–31)
Calcium: 9.1 mg/dL (ref 8.6–10.4)
Chloride: 98 mmol/L (ref 98–110)
Creat: 1.41 mg/dL — ABNORMAL HIGH (ref 0.50–1.05)
GLUCOSE: 76 mg/dL (ref 65–99)
POTASSIUM: 4 mmol/L (ref 3.5–5.3)
Sodium: 133 mmol/L — ABNORMAL LOW (ref 135–146)

## 2016-01-16 NOTE — Addendum Note (Signed)
Addended by: [name] R on: 01/16/2016 12:09 PM   Modules accepted: Orders

## 2016-01-31 ENCOUNTER — Telehealth: Payer: Self-pay | Admitting: Family Medicine

## 2016-01-31 NOTE — Telephone Encounter (Signed)
Called patient to see if they have had the flu shot.  If so when and where.  If not ask them to come by and get it.

## 2016-02-02 ENCOUNTER — Other Ambulatory Visit (INDEPENDENT_AMBULATORY_CARE_PROVIDER_SITE_OTHER): Payer: BC Managed Care – PPO | Admitting: *Deleted

## 2016-02-02 DIAGNOSIS — R7989 Other specified abnormal findings of blood chemistry: Secondary | ICD-10-CM

## 2016-02-02 DIAGNOSIS — R748 Abnormal levels of other serum enzymes: Secondary | ICD-10-CM

## 2016-02-02 LAB — BASIC METABOLIC PANEL
BUN: 28 mg/dL — ABNORMAL HIGH (ref 7–25)
CHLORIDE: 102 mmol/L (ref 98–110)
CO2: 28 mmol/L (ref 20–31)
Calcium: 9.7 mg/dL (ref 8.6–10.4)
Creat: 1.24 mg/dL — ABNORMAL HIGH (ref 0.50–1.05)
Glucose, Bld: 97 mg/dL (ref 65–99)
Potassium: 3.8 mmol/L (ref 3.5–5.3)
Sodium: 138 mmol/L (ref 135–146)

## 2016-02-02 NOTE — Patient Instructions (Signed)
     IF you received an x-ray today, you will receive an invoice from [facility]. Please contact [facility] at [phone] with questions or concerns regarding your invoice.   IF you received labwork today, you will receive an invoice from [facility]. Please contact [facility] at [phone] with questions or concerns regarding your invoice.   Our billing staff will not be able to assist you with questions regarding bills from these companies.  You will be contacted with the lab results as soon as they are available. The fastest way to get your results is to activate your My Chart account. Instructions are located on the last page of this paperwork. If you have not heard from us regarding the results in 2 weeks, please contact this office.      

## 2016-02-23 ENCOUNTER — Ambulatory Visit (INDEPENDENT_AMBULATORY_CARE_PROVIDER_SITE_OTHER): Payer: BC Managed Care – PPO

## 2016-02-23 ENCOUNTER — Ambulatory Visit (INDEPENDENT_AMBULATORY_CARE_PROVIDER_SITE_OTHER): Payer: BC Managed Care – PPO | Admitting: Family Medicine

## 2016-02-23 VITALS — BP 120/70 | HR 68 | Temp 98.2°F | Resp 16 | Ht 64.0 in | Wt 200.0 lb

## 2016-02-23 DIAGNOSIS — M7502 Adhesive capsulitis of left shoulder: Secondary | ICD-10-CM

## 2016-02-23 DIAGNOSIS — D492 Neoplasm of unspecified behavior of bone, soft tissue, and skin: Secondary | ICD-10-CM | POA: Diagnosis not present

## 2016-02-23 DIAGNOSIS — Z124 Encounter for screening for malignant neoplasm of cervix: Secondary | ICD-10-CM

## 2016-02-23 DIAGNOSIS — Z114 Encounter for screening for human immunodeficiency virus [HIV]: Secondary | ICD-10-CM

## 2016-02-23 DIAGNOSIS — Z13220 Encounter for screening for lipoid disorders: Secondary | ICD-10-CM | POA: Diagnosis not present

## 2016-02-23 DIAGNOSIS — M25512 Pain in left shoulder: Secondary | ICD-10-CM

## 2016-02-23 DIAGNOSIS — Z0000 Encounter for general adult medical examination without abnormal findings: Secondary | ICD-10-CM | POA: Diagnosis not present

## 2016-02-23 DIAGNOSIS — R748 Abnormal levels of other serum enzymes: Secondary | ICD-10-CM

## 2016-02-23 DIAGNOSIS — Z8639 Personal history of other endocrine, nutritional and metabolic disease: Secondary | ICD-10-CM

## 2016-02-23 DIAGNOSIS — I10 Essential (primary) hypertension: Secondary | ICD-10-CM | POA: Diagnosis not present

## 2016-02-23 DIAGNOSIS — Z23 Encounter for immunization: Secondary | ICD-10-CM | POA: Diagnosis not present

## 2016-02-23 DIAGNOSIS — Z8739 Personal history of other diseases of the musculoskeletal system and connective tissue: Secondary | ICD-10-CM

## 2016-02-23 DIAGNOSIS — Z1159 Encounter for screening for other viral diseases: Secondary | ICD-10-CM

## 2016-02-23 DIAGNOSIS — R7989 Other specified abnormal findings of blood chemistry: Secondary | ICD-10-CM

## 2016-02-23 NOTE — Patient Instructions (Addendum)
IF you received an x-ray today, you will receive an invoice from [facility]. Please contact [facility] at [phone] with questions or concerns regarding your invoice.   IF you received labwork today, you will receive an invoice from [facility]. Please contact [facility] at [phone] with questions or concerns regarding your invoice.   Our billing staff will not be able to assist you with questions regarding bills from these companies.  You will be contacted with the lab results as soon as they are available. The fastest way to get your results is to activate your My Chart account. Instructions are located on the last page of this paperwork. If you have not heard from [place] regarding the results in 2 weeks, please contact this office.    Ok to continue to hold valsartan, but keep a record of your blood pressures outside of the office and if running over 140/90  - restart that medication.  I will refer you to physical therapy for her shoulder, Tylenol is okay for now. Follow-up in the next 3-4 weeks to recheck shoulder and decide if an injection is needed at that time or if you need to see orthopedics.  I will refer you to Ear nose and throat for the areas inside your ear canal. Try not to pick at irritated areas in the meantime.  Return for fasting labs tomorrow morning if possible.  Keeping You Healthy  Get These Tests  Blood Pressure- Have your blood pressure checked by your healthcare provider at least once a year.  Normal blood pressure is 120/80.  Weight- Have your body mass index (BMI) calculated to screen for obesity.  BMI is a measure of body fat based on height and weight.  You can calculate your own BMI at [employer].it  Cholesterol- Have your cholesterol checked every year.  Diabetes- Have your blood sugar checked every year if you have high blood pressure, high cholesterol, a family history of diabetes or if you  are overweight.  Pap Test - Have a pap test every 1 to 5 years if you have been sexually active.  If you are older than 65 and recent pap tests have been normal you may not need additional pap tests.  In addition, if you have had a hysterectomy  for benign disease additional pap tests are not necessary.  Mammogram-Yearly mammograms are essential for early detection of breast cancer  Screening for Colon Cancer- Colonoscopy starting at age 82. Screening may begin sooner depending on your family history and other health conditions.  Follow up colonoscopy as directed by your Gastroenterologist.  Screening for Osteoporosis- Screening begins at age 16 with bone density scanning, sooner if you are at higher risk for developing Osteoporosis.  Get these medicines  Calcium with Vitamin D- Your body requires 1200-1500 mg of Calcium a day and [phone] IU of Vitamin D a day.  You can only absorb 500 mg of Calcium at a time therefore Calcium must be taken in 2 or 3 separate doses throughout the day.  Hormones- Hormone therapy has been associated with increased risk for certain cancers and heart disease.  Talk to your healthcare provider about if you need relief from menopausal symptoms.  Aspirin- Ask your healthcare provider about taking Aspirin to prevent Heart Disease and Stroke.  Get these Immuniztions  Flu shot- Every fall  Pneumonia shot- Once after the age of 107; if you are younger ask your healthcare provider if you need a pneumonia shot.  Tetanus- Every ten years.  Zostavax- Once after the age of 60 to prevent shingles.  Take these steps  Don't smoke- Your healthcare provider can help you quit. For tips on how to quit, ask your healthcare provider or go to www.smokefree.gov or call 1-800 QUIT-NOW.  Be physically active- Exercise 5 days a week for a minimum of 30 minutes.  If you are not already physically active, start slow and gradually work up to 30 minutes of moderate physical activity.   Try walking, dancing, bike riding, swimming, etc.  Eat a healthy diet- Eat a variety of healthy foods such as fruits, vegetables, whole grains, low fat milk, low fat cheeses, yogurt, lean meats, chicken, fish, eggs, dried beans, tofu, etc.  For more information go to www.thenutritionsource.org  Dental visit- Brush and floss teeth twice daily; visit your dentist twice a year.  Eye exam- Visit your Optometrist or Ophthalmologist yearly.  Drink alcohol in moderation- Limit alcohol intake to one drink or less a day.  Never drink and drive.  Depression- Your emotional health is as important as your physical health.  If you're feeling down or losing interest in things you normally enjoy, please talk to your healthcare provider.  Seat Belts- can save your life; always wear one  Smoke/Carbon Monoxide detectors- These detectors need to be installed on the appropriate level of your home.  Replace batteries at least once a year.  Violence- If anyone is threatening or hurting you, please tell your healthcare provider.  Living Will/ Health care power of attorney- Discuss with your healthcare provider and family.

## 2016-02-23 NOTE — Progress Notes (Signed)
Subjective:  By signing my name below, I, Moises Blood, attest that this documentation has been prepared under the direction and in the presence of [name], MD. Electronically Signed: Moises Blood, [place]. 02/23/2016 , 4:46 PM .  Patient was seen in Room 2 .   Patient ID: Candice [name], female    DOB: 1961-05-02, 55 y.o.   MRN: [id number] Chief Complaint  Patient presents with  . Annual Exam   HPI Candice [name] is a 55 y.o. female Here for annual physical. Her last meal was 2-3 hours ago. She's postmenopausal.   Left shoulder pain She's noticed left shoulder pain with decreased ROM started a month ago. She can't raise the left arm as high as before. She denies any known injury to the area. She denies taking any medication for this. She's only ran hot water in the shower over the area. The area has popped in the past. She denies seeing any orthopedics in the past.   She's right hand dominant.   Bump on ears She noticed a bump over her left ear and scraped it off but has been recurring for about a year now. She also noticed a similar bump over her right ear.   Cancer Screening Colonoscopy: She believes she had it 7 years ago and repeat in 10 years.  Mammogram: July 2016, recommended repeat in 1 year.  Cervical cancer screening: believes last pap smear testing was several years ago and reportedly normal.   Immunizations Tetanus: She believes her last tetanus shot was more than 5 years.  Flu: She denies receiving flu shot past year.   Depression Screening Depression screen [facility] 2/9 02/23/2016 01/02/2016 05/30/2015  Decreased Interest 0 0 0  Down, Depressed, Hopeless 0 0 0  PHQ - 2 Score 0 0 0    Vision  Visual Acuity Screening   Right eye Left eye Both eyes  Without correction: 20/40 20/40 20/40   With correction:      She hasn't been to ophthalmologist.   Dentist She has full dentures, upper and lower.   Exercise She's been exercising 4-6 times a week.    HTN She is on diovan and tenoretic.  Elevated serum creatinine in the past. Most recently 1.24 down from 1.4, 1 month ago.   She stopped taking her diovan for 2 days and her BP is still doing okay. She was curious if she can stay off of it. She denies complications with her medications. She's been checking her BP periodically at the store. She's been controlling her diet.   Gout She has h/o gout.  Lab Results  Component Value Date   LABURIC 7.0 08/19/2013   She had colchicine prescribed in 2014.   She's had 2-3 gout flare ups, usually in her right great toe, over the last year. She's been watching what she eats. She reports taking colchicine as needed.   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   Patient Active Problem List   Diagnosis Date Noted  . Cardiomegaly 11/06/2014  . HTN (hypertension) 08/19/2013  . Gout 08/19/2013  . Allergic urticaria 06/23/2013  . Contact dermatitis 07/16/2012   Past Medical History  Diagnosis Date  . Hypertension   . Heart murmur   . Allergy    Past Surgical History  Procedure Laterality Date  . Ovarian cyst removal Left     20 yrs ago  . Tubal ligation     Allergies  Allergen Reactions  .  Ace Inhibitors Nausea And Vomiting   Prior to Admission medications   Medication Sig Start Date End Date Taking? Authorizing Provider  atenolol-chlorthalidone (TENORETIC) 50-25 MG tablet Take 1 tablet by mouth daily. 01/02/16  Yes Wendie Agreste, MD  colchicine 0.6 MG tablet 1-2 tabs at onset of toe pain. Take once per flair only. 08/19/13  Yes Wendie Agreste, MD  Emollient (EUCERIN) lotion Apply topically as needed for dry skin. 04/04/14  Yes Roselee Culver, MD  levocetirizine (XYZAL) 5 MG tablet Take 1 tablet (5 mg total) by mouth every evening. 01/02/16  Yes Wendie Agreste, MD  triamcinolone ointment (KENALOG) 0.1 % Apply 1 application topically as needed. 05/30/15  Yes Wendie Agreste, MD   valsartan (DIOVAN) 160 MG tablet Take 1 tablet (160 mg total) by mouth daily. 01/02/16  Yes Wendie Agreste, MD  HYDROcodone-homatropine [facility]) 5-1.5 MG/5ML syrup 5m by mouth a bedtime as needed for cough. Patient not taking: Reported on 02/23/2016 01/02/16   Wendie Agreste, MD   Social History   Social History  . Marital Status: Divorced    Spouse Name: N/A  . Number of Children: N/A  . Years of Education: N/A   Occupational History  . Not on file.   Social History Main Topics  . Smoking status: Never Smoker   . Smokeless tobacco: Not on file  . Alcohol Use: No  . Drug Use: No  . Sexual Activity: Yes    Birth Control/ Protection: Surgical   Other Topics Concern  . Not on file   Social History Narrative   Review of Systems 13 point ROS - positive for bumps on ears, left shoulder pain    Objective:   Physical Exam  Constitutional: She is oriented to person, place, and time. She appears well-developed and well-nourished.  HENT:  Head: Normocephalic and atraumatic.  Right Ear: External ear normal.  Left Ear: External ear normal.  Mouth/Throat: Oropharynx is clear and moist.  Right ear: Minimal cerumen in canal; inferior aspect, there is a slightly thickened, scaled lesion approximately 2-57mm, slightly hyperpigmented  Left ear: entrance of canal, posterior aspect, similar appearing lesion but larger, approximately 5-39mm elevated with scaled hyperpigmented appearance  Eyes: Conjunctivae are normal. Pupils are equal, round, and reactive to light.  Neck: Normal range of motion. Neck supple. No thyromegaly present.  Cardiovascular: Normal rate, regular rhythm, normal heart sounds and intact distal pulses.   No murmur heard. Pulmonary/Chest: Effort normal and breath sounds normal. No respiratory distress. She has no wheezes.  Abdominal: Soft. Bowel sounds are normal. There is no tenderness.  Genitourinary: Vagina normal and uterus normal. There is no rash or lesion on the  right labia. There is no rash or lesion on the left labia. Cervix exhibits no motion tenderness and no discharge. Right adnexum displays no mass, no tenderness and no fullness. Left adnexum displays no mass, no tenderness and no fullness.  Musculoskeletal: Normal range of motion. She exhibits no edema or tenderness.  Left shoulder: no focal tenderness over [place] clavicle or AC joints, active rom lacking approximately 10 degrees with flexion, lacking approximately 15-20 degrees of abduction, internal rotation is limited to the approximate L-5 on left versus the mid thoracic area on right, negative lift-off, pain with empty can testing but equal strength, positive neer, negative hawkins, passive rom similar limitations  Lymphadenopathy:    She has no cervical adenopathy.  Neurological: She is alert and oriented to person, place, and time.  Skin: Skin  is warm and dry. No rash noted.  Psychiatric: She has a normal mood and affect. Her behavior is normal. Thought content normal.  Vitals reviewed.    Filed Vitals:   02/23/16 1447 02/23/16 1448  BP: 118/62 120/70  Pulse: 68   Temp: 98.2 F (36.8 C)   TempSrc: Oral   Resp: 16   Height: 5\' 4"  (1.626 m)   Weight: 200 lb (90.719 kg)   SpO2: 98%     Wt Readings from Last 3 Encounters:  02/23/16 200 lb (90.719 kg)  01/02/16 208 lb 12.8 oz (94.711 kg)  05/30/15 237 lb (107.502 kg)   Body mass index is 34.31 kg/(m^2).  Dg Shoulder Left  02/23/2016  CLINICAL DATA:  Left shoulder stiffness and decreased range of motion for the past month EXAM: LEFT SHOULDER - 2+ VIEW COMPARISON:  Chest x-ray of November 06, 2014 which includes a portion of the left shoulder. FINDINGS: The bones of the left shoulder are adequately mineralized. The subacromial subdeltoid space is preserved. The glenohumeral joint space is normal. There is a small spur arising from the inferior aspect of the distal clavicle and there is a small subacromial spur. There is no acute fracture  nor dislocation. IMPRESSION: Mild degenerative change of the [facility] joint. Probable small subacromial spur which may be impacting the distal rotator cuff. There is no acute bony abnormality. Electronically Signed   By: David  Martinique M.D.   On: 02/23/2016 17:08       Assessment & Plan:   Candice [name] is a 55 y.o. female Annual physical exam  --anticipatory guidance as below in AVS, screening labs above. Health maintenance items as above in HPI discussed/recommended as applicable.   Screening for cervical cancer - Plan: Pap IG, CT/NG w/ reflex HPV when ASC-U  Screening for hyperlipidemia - Plan: COMPLETE METABOLIC PANEL WITH GFR, Lipid panel  Left shoulder pain - Plan: Ambulatory referral to Physical Therapy, DG Shoulder Left Adhesive capsulitis, left - Plan: Ambulatory referral to Physical Therapy, DG Shoulder Left  -Discomfort with some decrease in terminal abduction, internal rotation, flexion. Suspicious for early adhesive capsulitis. No known injury. Spur noted that may be impacting rotator cuff on x-ray. No appreciable weakness with RTC testing.  -Start physical therapy for adhesive capsulitis, recheck in 3-4 weeks, considering inter-articular steroid injection versus orthopedic evaluation at that time. Avoiding NSAIDs at this time due to renal insufficiency, Tylenol okay if needed. Can call if stronger medication needed during physical therapy.  Essential hypertension - Plan: COMPLETE METABOLIC PANEL WITH GFR, Lipid panel  -Controlled, and appears to be controlled off of valsartan. Commended on weight loss with exercise. Will continue just Tenoretic for now, restart valsartan and if pressure over 140/90.  Need for Tdap vaccination - Plan: Tdap vaccine greater than or equal to 7yo IM  Elevated serum creatinine  -CMP pending. Avoid NSAIDs/nephrotoxins for now.  History of gout - Plan: Uric Acid  Growth of ear canal - Plan: Ambulatory referral to ENT  -seborrheic keratosis versus  hyperkeratotic/picker's nodule versus squamous cell. Refer to ENT for evaluation and possible biopsy.  Screening for HIV (human immunodeficiency virus) - Plan: HIV antibody,   Need for hepatitis C screening test - Plan: Hepatitis C antibody,   No orders of the defined types were placed in this encounter.   Patient Instructions       IF you received an x-ray today, you will receive an invoice from [facility]. Please contact [facility] at [phone] with questions  or concerns regarding your invoice.   IF you received labwork today, you will receive an invoice from [employer]. Please contact [facility] at [phone] with questions or concerns regarding your invoice.   Our billing staff will not be able to assist you with questions regarding bills from these companies.  You will be contacted with the lab results as soon as they are available. The fastest way to get your results is to activate your My Chart account. Instructions are located on the last page of this paperwork. If you have not heard from [place] regarding the results in 2 weeks, please contact this office.    Ok to continue to hold valsartan, but keep a record of your blood pressures outside of the office and if running over 140/90  - restart that medication.  I will refer you to physical therapy for her shoulder, Tylenol is okay for now. Follow-up in the next 3-4 weeks to recheck shoulder and decide if an injection is needed at that time or if you need to see orthopedics.  I will refer you to Ear nose and throat for the areas inside your ear canal. Try not to pick at irritated areas in the meantime.  Return for fasting labs tomorrow morning if possible.  Keeping You Healthy  Get These Tests  Blood Pressure- Have your blood pressure checked by your healthcare provider at least once a year.  Normal blood pressure is 120/80.  Weight- Have your body mass index (BMI) calculated  to screen for obesity.  BMI is a measure of body fat based on height and weight.  You can calculate your own BMI at [employer].it  Cholesterol- Have your cholesterol checked every year.  Diabetes- Have your blood sugar checked every year if you have high blood pressure, high cholesterol, a family history of diabetes or if you are overweight.  Pap Test - Have a pap test every 1 to 5 years if you have been sexually active.  If you are older than 65 and recent pap tests have been normal you may not need additional pap tests.  In addition, if you have had a hysterectomy  for benign disease additional pap tests are not necessary.  Mammogram-Yearly mammograms are essential for early detection of breast cancer  Screening for Colon Cancer- Colonoscopy starting at age 66. Screening may begin sooner depending on your family history and other health conditions.  Follow up colonoscopy as directed by your Gastroenterologist.  Screening for Osteoporosis- Screening begins at age 11 with bone density scanning, sooner if you are at higher risk for developing Osteoporosis.  Get these medicines  Calcium with Vitamin D- Your body requires 1200-1500 mg of Calcium a day and [phone] IU of Vitamin D a day.  You can only absorb 500 mg of Calcium at a time therefore Calcium must be taken in 2 or 3 separate doses throughout the day.  Hormones- Hormone therapy has been associated with increased risk for certain cancers and heart disease.  Talk to your healthcare provider about if you need relief from menopausal symptoms.  Aspirin- Ask your healthcare provider about taking Aspirin to prevent Heart Disease and Stroke.  Get these Immuniztions  Flu shot- Every fall  Pneumonia shot- Once after the age of 59; if you are younger ask your healthcare provider if you need a pneumonia shot.  Tetanus- Every ten years.  Zostavax- Once after the age of 76 to prevent shingles.  Take these steps  Don't smoke-  Your healthcare  provider can help you quit. For tips on how to quit, ask your healthcare provider or go to www.smokefree.gov or call 1-800 QUIT-NOW.  Be physically active- Exercise 5 days a week for a minimum of 30 minutes.  If you are not already physically active, start slow and gradually work up to 30 minutes of moderate physical activity.  Try walking, dancing, bike riding, swimming, etc.  Eat a healthy diet- Eat a variety of healthy foods such as fruits, vegetables, whole grains, low fat milk, low fat cheeses, yogurt, lean meats, chicken, fish, eggs, dried beans, tofu, etc.  For more information go to www.thenutritionsource.org  Dental visit- Brush and floss teeth twice daily; visit your dentist twice a year.  Eye exam- Visit your Optometrist or Ophthalmologist yearly.  Drink alcohol in moderation- Limit alcohol intake to one drink or less a day.  Never drink and drive.  Depression- Your emotional health is as important as your physical health.  If you're feeling down or losing interest in things you normally enjoy, please talk to your healthcare provider.  Seat Belts- can save your life; always wear one  Smoke/Carbon Monoxide detectors- These detectors need to be installed on the appropriate level of your home.  Replace batteries at least once a year.  Violence- If anyone is threatening or hurting you, please tell your healthcare provider.  Living Will/ Health care power of attorney- Discuss with your healthcare provider and family.    I personally performed the services described in this documentation, which was scribed in my presence. The recorded information has been reviewed and considered, and addended by me as needed.

## 2016-02-24 ENCOUNTER — Other Ambulatory Visit (INDEPENDENT_AMBULATORY_CARE_PROVIDER_SITE_OTHER): Payer: BC Managed Care – PPO | Admitting: *Deleted

## 2016-02-24 DIAGNOSIS — Z114 Encounter for screening for human immunodeficiency virus [HIV]: Secondary | ICD-10-CM

## 2016-02-24 DIAGNOSIS — Z13220 Encounter for screening for lipoid disorders: Secondary | ICD-10-CM

## 2016-02-24 DIAGNOSIS — Z8739 Personal history of other diseases of the musculoskeletal system and connective tissue: Secondary | ICD-10-CM

## 2016-02-24 DIAGNOSIS — I10 Essential (primary) hypertension: Secondary | ICD-10-CM

## 2016-02-24 DIAGNOSIS — Z1159 Encounter for screening for other viral diseases: Secondary | ICD-10-CM

## 2016-02-24 LAB — COMPLETE METABOLIC PANEL WITH GFR
ALBUMIN: 3.9 g/dL (ref 3.6–5.1)
ALK PHOS: 82 U/L (ref 33–130)
ALT: 10 U/L (ref 6–29)
AST: 18 U/L (ref 10–35)
BILIRUBIN TOTAL: 0.7 mg/dL (ref 0.2–1.2)
BUN: 21 mg/dL (ref 7–25)
CO2: 27 mmol/L (ref 20–31)
CREATININE: 1.21 mg/dL — AB (ref 0.50–1.05)
Calcium: 9.6 mg/dL (ref 8.6–10.4)
Chloride: 103 mmol/L (ref 98–110)
GFR, Est African American: 58 mL/min — ABNORMAL LOW (ref >=60)
GFR, Est Non African American: 50 mL/min — ABNORMAL LOW (ref >=60)
GLUCOSE: 84 mg/dL (ref 65–99)
Potassium: 3.6 mmol/L (ref 3.5–5.3)
SODIUM: 140 mmol/L (ref 135–146)
TOTAL PROTEIN: 8.2 g/dL — AB (ref 6.1–8.1)

## 2016-02-24 LAB — PAP IG, CT-NG, RFX HPV ASCU
CHLAMYDIA PROBE AMP: NOT DETECTED
GC PROBE AMP: NOT DETECTED

## 2016-02-24 LAB — HEPATITIS C ANTIBODY: HCV Ab: NEGATIVE

## 2016-02-24 LAB — LIPID PANEL
Cholesterol: 128 mg/dL (ref 125–200)
HDL: 56 mg/dL (ref >=46)
LDL CALC: 59 mg/dL (ref <130)
Total CHOL/HDL Ratio: 2.3 Ratio (ref <=5.0)
Triglycerides: 63 mg/dL (ref <150)
VLDL: 13 mg/dL (ref <30)

## 2016-02-24 LAB — URIC ACID: Uric Acid, Serum: 10.4 mg/dL — ABNORMAL HIGH (ref 2.4–7.0)

## 2016-02-24 LAB — HIV ANTIBODY (ROUTINE TESTING W REFLEX): HIV 1&2 Ab, 4th Generation: NONREACTIVE

## 2016-02-24 NOTE — Progress Notes (Signed)
Patient here today for lab draw only. 

## 2016-03-05 ENCOUNTER — Encounter: Payer: Self-pay | Admitting: *Deleted

## 2016-03-16 DIAGNOSIS — H6191 Disorder of right external ear, unspecified: Secondary | ICD-10-CM

## 2016-03-16 DIAGNOSIS — H6192 Disorder of left external ear, unspecified: Secondary | ICD-10-CM | POA: Insufficient documentation

## 2016-03-16 HISTORY — DX: Disorder of left external ear, unspecified: H61.92

## 2016-03-16 HISTORY — DX: Disorder of right external ear, unspecified: H61.91

## 2016-04-11 ENCOUNTER — Other Ambulatory Visit: Payer: Self-pay | Admitting: Family Medicine

## 2016-05-18 ENCOUNTER — Ambulatory Visit (INDEPENDENT_AMBULATORY_CARE_PROVIDER_SITE_OTHER): Payer: BC Managed Care – PPO | Admitting: Family Medicine

## 2016-05-18 VITALS — BP 126/87 | HR 55 | Temp 98.2°F | Resp 16 | Ht 64.5 in | Wt 186.0 lb

## 2016-05-18 DIAGNOSIS — R7989 Other specified abnormal findings of blood chemistry: Secondary | ICD-10-CM

## 2016-05-18 DIAGNOSIS — I10 Essential (primary) hypertension: Secondary | ICD-10-CM

## 2016-05-18 DIAGNOSIS — L568 Other specified acute skin changes due to ultraviolet radiation: Secondary | ICD-10-CM | POA: Diagnosis not present

## 2016-05-18 DIAGNOSIS — R748 Abnormal levels of other serum enzymes: Secondary | ICD-10-CM | POA: Diagnosis not present

## 2016-05-18 LAB — BASIC METABOLIC PANEL
BUN: 16 mg/dL (ref 7–25)
CALCIUM: 9.8 mg/dL (ref 8.6–10.4)
CO2: 28 mmol/L (ref 20–31)
CREATININE: 1.04 mg/dL (ref 0.50–1.05)
Chloride: 98 mmol/L (ref 98–110)
Glucose, Bld: 88 mg/dL (ref 65–99)
Potassium: 3.4 mmol/L — ABNORMAL LOW (ref 3.5–5.3)
SODIUM: 136 mmol/L (ref 135–146)

## 2016-05-18 MED ORDER — TRIAMCINOLONE ACETONIDE 0.1 % EX OINT: 1.0000 "application " | TOPICAL_OINTMENT | CUTANEOUS | Status: DC | PRN

## 2016-05-18 MED ORDER — ATENOLOL 50 MG PO TABS: 50.0000 mg | ORAL_TABLET | Freq: Every day | ORAL | Status: DC

## 2016-05-18 NOTE — Progress Notes (Signed)
By signing my name below, I, Mesha Guinyard, attest that this documentation has been prepared under the direction and in the presence of [name], MD.  Electronically Signed: Verlee Monte, Medical Scribe. 05/18/2016. 9:16 AM.  Subjective:    Patient ID: Candice [name], female    DOB: [birth year], 55 y.o.   MRN: [id number]  HPI Chief Complaint  Patient presents with  . Follow-up    Hypertension, Kidney Function Level    HPI Comments: Candice [name] is a 55 y.o. female who presents to the [facility] for follow-up on HTN, and elevated creatinine.  Photosensitivity/Hives: See prior visit. Pt would like to stop taking her HTN medication. Pt noticed her skin was breaking out in hives due to photosensitivity when she moved to [place] 5 years ago. Pt can't sit on the front porch in the sun, or drive in the car during the day without breaking out in hives. Pt takes Xyzal daily for relief to her symptoms. We have tried discontinuing Chlorthalidone, and Atenolol in the past- still having recurring symptoms. Of note she has had uncontrolled HTN which required EMS transport to the hospital in 2015.  HTN: Pt states her bp has been ranging around 99-120/71. Pt is going to a dietitian next week to help her with her sodium intake for her HTN. Lab Results  Component Value Date   CREATININE 1.21* 02/24/2016   Elevated Creatinine: It was stable 2 months ago at 1.21. Advised to avoid nephrotoxins.  Patient Active Problem List   Diagnosis Date Noted  . Cardiomegaly 11/06/2014  . HTN (hypertension) 08/19/2013  . Gout 08/19/2013  . Allergic urticaria 06/23/2013  . Contact dermatitis 07/16/2012   Past Medical History  Diagnosis Date  . Hypertension   . Heart murmur   . Allergy    Past Surgical History  Procedure Laterality Date  . Ovarian cyst removal Left     20 yrs ago  . Tubal ligation     Allergies  Allergen Reactions  . Ace Inhibitors Nausea And  Vomiting   Prior to Admission medications   Medication Sig Start Date End Date Taking? Authorizing Provider  atenolol-chlorthalidone (TENORETIC) 50-25 MG tablet TAKE 1 TABLET BY MOUTH DAILY. 04/12/16  Yes Wendie Agreste, MD  colchicine 0.6 MG tablet 1-2 tabs at onset of toe [name]. Take once per flair only. 08/19/13  Yes Wendie Agreste, MD  Emollient (EUCERIN) lotion Apply topically as needed for dry skin. 04/04/14  Yes Roselee Culver, MD  levocetirizine (XYZAL) 5 MG tablet Take 1 tablet (5 mg total) by mouth every evening. 01/02/16  Yes Wendie Agreste, MD  triamcinolone ointment (KENALOG) 0.1 % Apply 1 application topically as needed. 05/30/15  Yes Wendie Agreste, MD   Social History   Social History  . Marital Status: Divorced    Spouse Name: N/A  . Number of Children: N/A  . Years of Education: N/A   Occupational History  . Not on file.   Social History Main Topics  . Smoking status: Never Smoker   . Smokeless tobacco: Not on file  . Alcohol Use: No  . Drug Use: No  . Sexual Activity: Yes    Birth Control/ Protection: Surgical   Other Topics Concern  . Not on file   Social History Narrative   Review of Systems  Constitutional: Negative for fatigue and unexpected weight change.  Respiratory: Negative for chest tightness and shortness of breath.   Cardiovascular: Negative for  chest [name], palpitations and leg swelling.  Gastrointestinal: Negative for abdominal [name] and blood in stool.  Skin: Positive for rash.  Neurological: Negative for dizziness, syncope, light-headedness and headaches.   Objective:  BP 126/87 mmHg  Pulse 55  Temp(Src) 98.2 F (36.8 C) (Oral)  Resp 16  Ht 5' 4.5" (1.638 m)  Wt 186 lb (84.369 kg)  BMI 31.45 kg/m2  SpO2 100%  Physical Exam  Constitutional: She is oriented to person, place, and time. She appears well-developed and well-nourished.  HENT:  Head: Normocephalic and atraumatic.  Eyes: Conjunctivae and EOM are normal. Pupils are  equal, round, and reactive to light.  Neck: Carotid bruit is not present.  Cardiovascular: Normal rate, regular rhythm, normal heart sounds and intact distal pulses.   Pulmonary/Chest: Effort normal and breath sounds normal.  Abdominal: Soft. She exhibits no pulsatile midline mass. There is no tenderness.  Musculoskeletal: She exhibits no edema.  Neurological: She is alert and oriented to person, place, and time.  Skin: Skin is warm and dry.  Psychiatric: She has a normal mood and affect. Her behavior is normal.  Vitals reviewed.   Assessment & Plan:   Candice [name] is a 55 y.o. female Essential hypertension  -Stable, with some lower readings at home.  -She requests to try discontinuing one of medications to see if they are causing photodermatitis. See previous notes, have tried stopping chlorthalidone, atenolol, other meds in the past without significant change in her symptoms, but can try discontinuing chlorthalidone one more time.  -Continue atenolol 50 mg daily, start DASH diet to help manage pressure, monitor home readings at least once or twice per day, and if elevating, may need to add another agent. RTC precautions discussed.  Photosensitivity dermatitis, Photodermatitis due to sun - Plan: triamcinolone ointment (KENALOG) 0.1 %  -Still intermittent symptoms. We'll try discontinuing chlorthalidone as above, but advised patient to follow-up with dermatology or allergist if symptoms persist to look into other options. Continue antihistamine for now, triamcinolone ointment if needed for breakthrough symptoms. RTC/ER precautions.  Elevated serum creatinine - Plan: Basic metabolic panel  -Continue to avoid NSAIDs, recheck kidney function. Maintain hydration.  Meds ordered this encounter  Medications  . triamcinolone ointment (KENALOG) 0.1 %    Sig: Apply 1 application topically as needed.    Dispense:  30 g    Refill:  1  . atenolol (TENORMIN) 50 MG tablet    Sig: Take 1 tablet  (50 mg total) by mouth daily.    Dispense:  90 tablet    Refill:  1   Patient Instructions       IF you received an x-ray today, you will receive an invoice from [facility]. Please contact [facility] at [phone] with questions or concerns regarding your invoice.   IF you received labwork today, you will receive an invoice from [employer]. Please contact [facility] at [phone] with questions or concerns regarding your invoice.   Our billing staff will not be able to assist you with questions regarding bills from these companies.  You will be contacted with the lab results as soon as they are available. The fastest way to get your results is to activate your My Chart account. Instructions are located on the last page of this paperwork. If you have not heard from [place] regarding the results in 2 weeks, please contact this office.    Can try to just decrease your blood pressure medicine to atenolol only, but as we have  noticed in the past,there is a potential for your blood pressure to go up very high. It looks okay today, so keep an eye on this twice per day with change in medication. Unfortunately with your heart rate, I do not fill comfortable increasing the atenolol from the current dose.  Keep a record of your blood pressures outside of the office and if running over 140/90 - return to change meds again.   If the photosensitivity persists - you will need to follow up with allergist or dermatologist to look into other causes/treatments.   Return to the clinic or go to the nearest emergency room if any of your symptoms worsen or new symptoms occur.  DASH Eating Plan DASH stands for "Dietary Approaches to Stop Hypertension." The DASH eating plan is a healthy eating plan that has been shown to reduce high blood pressure (hypertension). Additional health benefits may include reducing the risk of type 2 diabetes mellitus, heart disease, and  stroke. The DASH eating plan may also help with weight loss. WHAT DO I NEED TO KNOW ABOUT THE DASH EATING PLAN? For the DASH eating plan, you will follow these general guidelines:  Choose foods with a percent daily value for sodium of less than 5% (as listed on the food label).  Use salt-free seasonings or herbs instead of table salt or sea salt.  Check with your health care provider or pharmacist before using salt substitutes.  Eat lower-sodium products, often labeled as "lower sodium" or "no salt added."  Eat fresh foods.  Eat more vegetables, fruits, and low-fat dairy products.  Choose whole grains. Look for the word "whole" as the first word in the ingredient list.  Choose fish and skinless chicken or [place] more often than red meat. Limit fish, poultry, and meat to 6 oz (170 g) each day.  Limit sweets, desserts, sugars, and sugary drinks.  Choose heart-healthy fats.  Limit cheese to 1 oz (28 g) per day.  Eat more home-cooked food and less restaurant, buffet, and fast food.  Limit fried foods.  Cook foods using methods other than frying.  Limit canned vegetables. If you do use them, rinse them well to decrease the sodium.  When eating at a restaurant, ask that your food be prepared with less salt, or no salt if possible. WHAT FOODS CAN I EAT? Seek help from a dietitian for individual calorie needs. Grains Whole grain or whole wheat bread. Brown rice. Whole grain or whole wheat pasta. Quinoa, bulgur, and whole grain cereals. Low-sodium cereals. Corn or whole wheat flour tortillas. Whole grain cornbread. Whole grain crackers. Low-sodium crackers. Vegetables Fresh or frozen vegetables (raw, steamed, roasted, or grilled). Low-sodium or reduced-sodium tomato and vegetable juices. Low-sodium or reduced-sodium tomato sauce and paste. Low-sodium or reduced-sodium canned vegetables.  Fruits All fresh, canned (in natural juice), or frozen fruits. Meat and Other Protein  Products Ground beef (85% or leaner), grass-fed beef, or beef trimmed of fat. Skinless chicken or [place]. Ground chicken or [place]. Pork trimmed of fat. All fish and seafood. Eggs. Dried beans, peas, or lentils. Unsalted nuts and seeds. Unsalted canned beans. Dairy Low-fat dairy products, such as skim or 1% milk, 2% or reduced-fat cheeses, low-fat ricotta or cottage cheese, or plain low-fat yogurt. Low-sodium or reduced-sodium cheeses. Fats and Oils Tub margarines without trans fats. Light or reduced-fat mayonnaise and salad dressings (reduced sodium). Avocado. Safflower, olive, or canola oils. Natural peanut or almond butter. Other Unsalted popcorn and pretzels. The items listed above may not be  a complete list of recommended foods or beverages. Contact your dietitian for more options. WHAT FOODS ARE NOT RECOMMENDED? Grains White bread. White pasta. White rice. Refined cornbread. Bagels and croissants. Crackers that contain trans fat. Vegetables Creamed or fried vegetables. Vegetables in a cheese sauce. Regular canned vegetables. Regular canned tomato sauce and paste. Regular tomato and vegetable juices. Fruits Dried fruits. Canned fruit in light or heavy syrup. Fruit juice. Meat and Other Protein Products Fatty cuts of meat. Ribs, chicken wings, bacon, sausage, bologna, salami, chitterlings, fatback, hot dogs, bratwurst, and packaged luncheon meats. Salted nuts and seeds. Canned beans with salt. Dairy Whole or 2% milk, cream, half-and-half, and cream cheese. Whole-fat or sweetened yogurt. Full-fat cheeses or blue cheese. Nondairy creamers and whipped toppings. Processed cheese, cheese spreads, or cheese curds. Condiments Onion and garlic salt, seasoned salt, table salt, and sea salt. Canned and packaged gravies. Worcestershire sauce. Tartar sauce. Barbecue sauce. Teriyaki sauce. Soy sauce, including reduced sodium. Steak sauce. Fish sauce. Oyster sauce. Cocktail sauce. Horseradish. Ketchup and  mustard. Meat flavorings and tenderizers. Bouillon cubes. Hot sauce. Tabasco sauce. Marinades. Taco seasonings. Relishes. Fats and Oils Butter, stick margarine, lard, shortening, ghee, and bacon fat. Coconut, palm kernel, or palm oils. Regular salad dressings. Other Pickles and olives. Salted popcorn and pretzels. The items listed above may not be a complete list of foods and beverages to avoid. Contact your dietitian for more information. WHERE CAN I FIND MORE INFORMATION? National Heart, Lung, and Blood Institute: travelstabloid.com   This information is not intended to replace advice given to you by your health care provider. Make sure you discuss any questions you have with your health care provider.   Document Released: 10/18/2011 Document Revised: 11/19/2014 Document Reviewed: 09/02/2013 Elsevier Interactive Patient Education [employer].     I personally performed the services described in this documentation, which was scribed in my presence. The recorded information has been reviewed and considered, and addended by me as needed.   Signed,   [name], MD [facility].  05/18/2016 11:02 AM

## 2016-05-18 NOTE — Patient Instructions (Addendum)
IF you received an x-ray today, you will receive an invoice from [facility]. Please contact [facility] at [phone] with questions or concerns regarding your invoice.   IF you received labwork today, you will receive an invoice from [employer]. Please contact [facility] at [phone] with questions or concerns regarding your invoice.   Our billing staff will not be able to assist you with questions regarding bills from these companies.  You will be contacted with the lab results as soon as they are available. The fastest way to get your results is to activate your My Chart account. Instructions are located on the last page of this paperwork. If you have not heard from [place] regarding the results in 2 weeks, please contact this office.    Can try to just decrease your blood pressure medicine to atenolol only, but as we have noticed in the past,there is a potential for your blood pressure to go up very high. It looks okay today, so keep an eye on this twice per day with change in medication. Unfortunately with your heart rate, I do not fill comfortable increasing the atenolol from the current dose.  Keep a record of your blood pressures outside of the office and if running over 140/90 - return to change meds again.   If the photosensitivity persists - you will need to follow up with allergist or dermatologist to look into other causes/treatments.   Return to the clinic or go to the nearest emergency room if any of your symptoms worsen or new symptoms occur.  DASH Eating Plan DASH stands for "Dietary Approaches to Stop Hypertension." The DASH eating plan is a healthy eating plan that has been shown to reduce high blood pressure (hypertension). Additional health benefits may include reducing the risk of type 2 diabetes mellitus, heart disease, and stroke. The DASH eating plan may also help with weight loss. WHAT DO I NEED TO KNOW ABOUT THE DASH  EATING PLAN? For the DASH eating plan, you will follow these general guidelines:  Choose foods with a percent daily value for sodium of less than 5% (as listed on the food label).  Use salt-free seasonings or herbs instead of table salt or sea salt.  Check with your health care provider or pharmacist before using salt substitutes.  Eat lower-sodium products, often labeled as "lower sodium" or "no salt added."  Eat fresh foods.  Eat more vegetables, fruits, and low-fat dairy products.  Choose whole grains. Look for the word "whole" as the first word in the ingredient list.  Choose fish and skinless chicken or [place] more often than red meat. Limit fish, poultry, and meat to 6 oz (170 g) each day.  Limit sweets, desserts, sugars, and sugary drinks.  Choose heart-healthy fats.  Limit cheese to 1 oz (28 g) per day.  Eat more home-cooked food and less restaurant, buffet, and fast food.  Limit fried foods.  Cook foods using methods other than frying.  Limit canned vegetables. If you do use them, rinse them well to decrease the sodium.  When eating at a restaurant, ask that your food be prepared with less salt, or no salt if possible. WHAT FOODS CAN I EAT? Seek help from a dietitian for individual calorie needs. Grains Whole grain or whole wheat bread. Brown rice. Whole grain or whole wheat pasta. Quinoa, bulgur, and whole grain cereals. Low-sodium cereals. Corn or whole wheat flour tortillas. Whole grain cornbread. Whole grain crackers. Low-sodium crackers. Vegetables  Fresh or frozen vegetables (raw, steamed, roasted, or grilled). Low-sodium or reduced-sodium tomato and vegetable juices. Low-sodium or reduced-sodium tomato sauce and paste. Low-sodium or reduced-sodium canned vegetables.  Fruits All fresh, canned (in natural juice), or frozen fruits. Meat and Other Protein Products Ground beef (85% or leaner), grass-fed beef, or beef trimmed of fat. Skinless chicken or [place].  Ground chicken or [place]. Pork trimmed of fat. All fish and seafood. Eggs. Dried beans, peas, or lentils. Unsalted nuts and seeds. Unsalted canned beans. Dairy Low-fat dairy products, such as skim or 1% milk, 2% or reduced-fat cheeses, low-fat ricotta or cottage cheese, or plain low-fat yogurt. Low-sodium or reduced-sodium cheeses. Fats and Oils Tub margarines without trans fats. Light or reduced-fat mayonnaise and salad dressings (reduced sodium). Avocado. Safflower, olive, or canola oils. Natural peanut or almond butter. Other Unsalted popcorn and pretzels. The items listed above may not be a complete list of recommended foods or beverages. Contact your dietitian for more options. WHAT FOODS ARE NOT RECOMMENDED? Grains White bread. White pasta. White rice. Refined cornbread. Bagels and croissants. Crackers that contain trans fat. Vegetables Creamed or fried vegetables. Vegetables in a cheese sauce. Regular canned vegetables. Regular canned tomato sauce and paste. Regular tomato and vegetable juices. Fruits Dried fruits. Canned fruit in light or heavy syrup. Fruit juice. Meat and Other Protein Products Fatty cuts of meat. Ribs, chicken wings, bacon, sausage, bologna, salami, chitterlings, fatback, hot dogs, bratwurst, and packaged luncheon meats. Salted nuts and seeds. Canned beans with salt. Dairy Whole or 2% milk, cream, half-and-half, and cream cheese. Whole-fat or sweetened yogurt. Full-fat cheeses or blue cheese. Nondairy creamers and whipped toppings. Processed cheese, cheese spreads, or cheese curds. Condiments Onion and garlic salt, seasoned salt, table salt, and sea salt. Canned and packaged gravies. Worcestershire sauce. Tartar sauce. Barbecue sauce. Teriyaki sauce. Soy sauce, including reduced sodium. Steak sauce. Fish sauce. Oyster sauce. Cocktail sauce. Horseradish. Ketchup and mustard. Meat flavorings and tenderizers. Bouillon cubes. Hot sauce. Tabasco sauce. Marinades. Taco  seasonings. Relishes. Fats and Oils Butter, stick margarine, lard, shortening, ghee, and bacon fat. Coconut, palm kernel, or palm oils. Regular salad dressings. Other Pickles and olives. Salted popcorn and pretzels. The items listed above may not be a complete list of foods and beverages to avoid. Contact your dietitian for more information. WHERE CAN I FIND MORE INFORMATION? National Heart, Lung, and Blood Institute: travelstabloid.com   This information is not intended to replace advice given to you by your health care provider. Make sure you discuss any questions you have with your health care provider.   Document Released: 10/18/2011 Document Revised: 11/19/2014 Document Reviewed: 09/02/2013 Elsevier Interactive Patient Education [employer].

## 2016-05-20 ENCOUNTER — Other Ambulatory Visit: Payer: Self-pay | Admitting: Family Medicine

## 2016-05-29 ENCOUNTER — Telehealth: Payer: Self-pay

## 2016-05-29 NOTE — Telephone Encounter (Signed)
Dr. Carlota Raspberry,  I called patient and informed her of her lab results. Patient had a two questions. 1. Can the Triamcinolone ointment be switch to a cream?. 2. Since she has been off of the HCTZ for her blood pressure she has been gaining weight, she would like to know if she can take an OTC fluid pill or what should she do? Per patient she is eating right and still doing all of her healthy things. Please advise    Thanks   Karley Pho

## 2016-05-30 ENCOUNTER — Other Ambulatory Visit: Payer: Self-pay | Admitting: Family Medicine

## 2016-05-30 MED ORDER — TRIAMCINOLONE ACETONIDE 0.1 % EX CREA: 1.0000 "application " | TOPICAL_CREAM | Freq: Two times a day (BID) | CUTANEOUS | Status: DC | PRN

## 2016-05-30 NOTE — Telephone Encounter (Signed)
Pt advised.

## 2016-05-30 NOTE — Telephone Encounter (Signed)
No problem on the ointment switch to a cream. I will enter that today. As far as the weight gain, summertime is often a time we see increased swelling just with the heat. I do not have any over-the-counter fluid pill that I could recommend, but avoiding salt in the diet can also help with that fluid retention. If persisting to gain weight or increasing swelling, return for recheck to discuss other causes.

## 2016-10-05 ENCOUNTER — Ambulatory Visit (INDEPENDENT_AMBULATORY_CARE_PROVIDER_SITE_OTHER): Payer: BC Managed Care – PPO | Admitting: Family Medicine

## 2016-10-05 VITALS — BP 148/96 | HR 50 | Temp 98.1°F | Resp 16 | Ht 64.5 in | Wt 198.2 lb

## 2016-10-05 DIAGNOSIS — M7062 Trochanteric bursitis, left hip: Secondary | ICD-10-CM | POA: Diagnosis not present

## 2016-10-05 DIAGNOSIS — M5442 Lumbago with sciatica, left side: Secondary | ICD-10-CM

## 2016-10-05 DIAGNOSIS — M25552 Pain in left hip: Secondary | ICD-10-CM | POA: Diagnosis not present

## 2016-10-05 DIAGNOSIS — I10 Essential (primary) hypertension: Secondary | ICD-10-CM

## 2016-10-05 MED ORDER — LOSARTAN POTASSIUM 50 MG PO TABS: 25.0000 mg | ORAL_TABLET | Freq: Every day | ORAL | 3 refills | Status: DC

## 2016-10-05 MED ORDER — CYCLOBENZAPRINE HCL 5 MG PO TABS: 2.5000 mg | ORAL_TABLET | Freq: Every evening | ORAL | 0 refills | Status: DC | PRN

## 2016-10-05 NOTE — Progress Notes (Signed)
Subjective:    Patient ID: Candice Candice [name], female    DOB: [birth year], 55 y.o.   MRN: [id number]  HPI Candice Candice [name] is a 55 y.o. female  Had stiff joints in hips and thighs when taking valsartan in July, but didn't mention those sx's then. Had trouble standing up and trouble climbing and descending stairs.  When stopped valsartan, stiffness/sches in joints improved. Stiffness improved, but Candice [name] still there.  In June or July using elbows to push up onto thighs, thinks it caused some soreness into left leg. Candice [name] into front of left thigh, into left groin, around to back of hip/buttock and into low back.  Feels Candice [name] shooting down inside of leg at times.    Treatment: advil otc - 1-2x week.  2 tablets - helps sx.   No bowel or bladder incontinence, no saddle anesthesia, no lower extremity weakness. No rash.   Has been doing more packing, lifting boxes at home over the past few months. No one specific injury.  Hypertension: See last ov, as regimen changed. Elevated out of office as well. [id number] systolic, [id number] diastolic.   Patient Active Problem List   Diagnosis Date Noted  . Cardiomegaly 11/06/2014  . HTN (hypertension) 08/19/2013  . Gout 08/19/2013  . Allergic urticaria 06/23/2013  . Contact dermatitis 07/16/2012   Past Medical History:  Diagnosis Date  . Allergy   . Heart murmur   . Hypertension    Past Surgical History:  Procedure Laterality Date  . OVARIAN CYST REMOVAL Left    20 yrs ago  . TUBAL LIGATION     Allergies  Allergen Reactions  . Ace Inhibitors Nausea And Vomiting   Prior to Admission medications   Medication Sig Start Date End Date Taking? Authorizing Provider  atenolol (TENORMIN) 50 MG tablet Take 1 tablet (50 mg total) by mouth daily. 05/18/16  Yes Wendie Agreste, MD  colchicine 0.6 MG tablet 1-2 tabs at onset of toe Candice [name]. Take once per flair only. 08/19/13  Yes Wendie Agreste, MD  Emollient (EUCERIN) lotion Apply topically as needed for dry skin.  04/04/14  Yes Roselee Culver, MD  levocetirizine (XYZAL) 5 MG tablet Take 1 tablet (5 mg total) by mouth every evening. 01/02/16  Yes Wendie Agreste, MD  triamcinolone cream (KENALOG) 0.1 % Apply 1 application topically 2 (two) times daily as needed. 05/30/16  Yes Wendie Agreste, MD   Social History   Social History  . Marital status: Divorced    Spouse name: N/A  . Number of children: N/A  . Years of education: N/A   Occupational History  . Not on file.   Social History Main Topics  . Smoking status: Never Smoker  . Smokeless tobacco: Never Used  . Alcohol use No  . Drug use: No  . Sexual activity: Yes    Birth control/ protection: Surgical   Other Topics Concern  . Not on file   Social History Narrative  . No narrative on file    Review of Systems  Constitutional: Negative for fatigue and unexpected weight change.  Respiratory: Negative for chest tightness and shortness of breath.   Cardiovascular: Negative for chest Candice [name], palpitations and leg swelling.  Gastrointestinal: Negative for abdominal Candice [name] and blood in stool.  Neurological: Negative for dizziness, syncope, light-headedness and headaches.       Objective:   Physical Exam  Constitutional: She is oriented to person, place, and time. She appears well-developed and well-nourished.  HENT:  Head: Normocephalic and atraumatic.  Eyes: Conjunctivae and EOM are normal. Pupils are equal, round, and reactive to light.  Neck: Carotid bruit is not present.  Cardiovascular: Normal rate, regular rhythm, normal heart sounds and intact distal pulses.   Pulmonary/Chest: Effort normal and breath sounds normal.  Abdominal: Soft. She exhibits no pulsatile midline mass. There is no tenderness.  Musculoskeletal:       Left hip: She exhibits normal range of motion and normal strength. Tenderness: TTP over left trochanteric bursa, negative FABER, negative FADIR.       Lumbar back: She exhibits normal range of motion, no  tenderness (MIN TTP LEFT SCIATIC NOTCH. ), no bony tenderness and no deformity.  Neurological: She is alert and oriented to person, place, and time.  Skin: Skin is warm and dry.  Psychiatric: She has a normal mood and affect. Her behavior is normal.  Vitals reviewed.  Vitals:   10/05/16 1548  BP: (!) 148/96  Pulse: (!) 50  Resp: 16  Temp: 98.1 F (36.7 C)  TempSrc: Oral  SpO2: 100%  Weight: 198 lb 3.2 oz (89.9 kg)  Height: 5' 4.5" (1.638 m)       Assessment & Plan:    Candice Candice [name] is a 55 y.o. female Essential hypertension - Plan: losartan (COZAAR) 50 MG tablet  -uncontrolled. Trial of low dose losartan in addition to other meds. Doubt prior stiff joints d/t ARB, but rtc precautions if experiences with cozaar.   - check outside BP and rtc in 3-4 weeks for recheck.   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   -d/t elevated BP, will avoid nsaids, or limit use once BP controlled.  -Tylenol,  trial of low dose flexeril, ROM /stretches/HEP by handouts and recheck in next  4 weeks. Sooner if worse or Candice [name] with weight bearing.   Meds ordered this encounter  Medications  . losartan (COZAAR) 50 MG tablet    Sig: Take 0.5 tablets (25 mg total) by mouth daily.    Dispense:  30 tablet    Refill:  3  . cyclobenzaprine (FLEXERIL) 5 MG tablet    Sig: Take 0.5-1 tablets (2.5-5 mg total) by mouth at bedtime as needed.    Dispense:  15 tablet    Refill:  0   Patient Instructions   Your leg symptoms may be due to a combination of mild sciatica, and trochanteric bursitis on the outside of your hip. Try the exercises below, Tylenol over-the-counter if needed, and Flexeril muscle relaxant if needed at bedtime. If your blood pressure is controlled, you can take Advil  over-the-counter short-term only as needed.  Return in the next 3-4 weeks to recheck pains, and possible imaging at that time. Also can consider injection into the trochanteric bursa if still painful at that point.  Blood pressure elevated today, start low-dose losartan in addition to your other medication for now. Keep a record of those pressures outside the office, and follow-up in 3-4 weeks. Sooner if any new or worsening symptoms.  Sciatica Rehab Ask your health care provider which exercises are safe for you. Do exercises exactly as told by your health care provider and adjust them as directed. It is normal to feel mild stretching, pulling, tightness, or discomfort as you do these exercises, but you should stop  right away if you feel sudden Candice [name] or your Candice [name] gets worse.Do not begin these exercises until told by your health care provider. Stretching and range of motion exercises These exercises warm up your muscles and joints and improve the movement and flexibility of your hips and your back. These exercises also help to relieve Candice [name], numbness, and tingling. Exercise A: Sciatic nerve glide 1. Sit in a chair with your head facing down toward your chest. Place your hands behind your back. Let your shoulders slump forward. 2. Slowly straighten one of your knees while you tilt your head back as if you are looking toward the ceiling. Only straighten your leg as far as you can without making your symptoms worse. 3. Hold for __________ seconds. 4. Slowly return to the starting position. 5. Repeat with your other leg. Repeat __________ times. Complete this exercise __________ times a day. Exercise B: Knee to chest with hip adduction and internal rotation 1. Lie on your back on a firm surface with both legs straight. 2. Bend one of your knees and move it up toward your chest until you feel a gentle stretch in your lower back and buttock. Then, move your knee toward the shoulder that is on the opposite  side from your leg.  Hold your leg in this position by holding onto the front of your knee. 3. Hold for __________ seconds. 4. Slowly return to the starting position. 5. Repeat with your other leg. Repeat __________ times. Complete this exercise __________ times a day. Exercise C: Prone extension on elbows 1. Lie on your abdomen on a firm surface. A bed may be too soft for this exercise. 2. Prop yourself up on your elbows. 3. Use your arms to help lift your chest up until you feel a gentle stretch in your abdomen and your lower back.  This will place some of your body weight on your elbows. If this is uncomfortable, try stacking pillows under your chest.  Your hips should stay down, against the surface that you are lying on. Keep your hip and back muscles relaxed. 4. Hold for __________ seconds. 5. Slowly relax your upper body and return to the starting position. Repeat __________ times. Complete this exercise __________ times a day. Strengthening exercises These exercises build strength and endurance in your back. Endurance is the ability to use your muscles for a long time, even after they get tired. Exercise D: Pelvic tilt 1. Lie on your back on a firm surface. Bend your knees and keep your feet flat. 2. Tense your abdominal muscles. Tip your pelvis up toward the ceiling and flatten your lower back into the floor.  To help with this exercise, you may place a small towel under your lower back and try to push your back into the towel. 3. Hold for __________ seconds. 4. Let your muscles relax completely before you repeat this exercise. Repeat __________ times. Complete this exercise __________ times a day. Exercise E: Alternating arm and leg raises 1. Get on your hands and knees on a firm surface. If you are on a hard floor, you may want to use padding to cushion your knees, such as an exercise mat. 2. Line up your arms and legs. Your hands should be below your shoulders, and your knees  should be below your hips. 3. Lift your left leg behind you. At the same time, raise your right arm and straighten it in front of you.  Do not lift your leg higher than your hip.  Do not lift your arm higher than your shoulder.  Keep your abdominal and back muscles tight.  Keep your hips facing the ground.  Do not arch your back.  Keep your balance carefully, and do not hold your breath. 4. Hold for __________ seconds. 5. Slowly return to the starting position and repeat with your right leg and your left arm. Repeat __________ times. Complete this exercise __________ times a day. Posture and body mechanics   Body mechanics refers to the movements and positions of your body while you do your daily activities. Posture is part of body mechanics. Good posture and healthy body mechanics can help to relieve stress in your body's tissues and joints. Good posture means that your spine is in its natural S-curve position (your spine is neutral), your shoulders are pulled back slightly, and your head is not tipped forward. The following are general guidelines for applying improved posture and body mechanics to your everyday activities. Standing   When standing, keep your spine neutral and your feet about hip-width apart. Keep a slight bend in your knees. Your ears, shoulders, and hips should line up.  When you do a task in which you stand in one place for a long time, place one foot up on a stable object that is 2-4 inches (5-10 cm) high, such as a footstool. This helps keep your spine neutral. Sitting  When sitting, keep your spine neutral and keep your feet flat on the floor. Use a footrest, if necessary, and keep your thighs parallel to the floor. Avoid rounding your shoulders, and avoid tilting your head forward.  When working at a desk or a computer, keep your desk at a height where your hands are slightly lower than your elbows. Slide your chair under your desk so you are close enough to  maintain good posture.  When working at a computer, place your monitor at a height where you are looking straight ahead and you do not have to tilt your head forward or downward to look at the screen. Resting   When lying down and resting, avoid positions that are most painful for you.  If you have Candice [name] with activities such as sitting, bending, stooping, or squatting (flexion-based activities), lie in a position in which your body does not bend very much. For example, avoid curling up on your side with your arms and knees near your chest (fetal position).  If you have Candice [name] with activities such as standing for a long time or reaching with your arms (extension-based activities), lie with your spine in a neutral position and bend your knees slightly. Try the following positions:  Lying on your side with a pillow between your knees.  Lying on your back with a pillow under your knees. Lifting   When lifting objects, keep your feet at least shoulder-width apart and tighten your abdominal muscles.  Bend your knees and hips and keep your spine neutral. It is important to lift using the strength of your legs, not your back. Do not lock your knees straight out.  Always ask for help to lift heavy or awkward objects. This information is not intended to replace advice given to you by your health care provider. Make sure you discuss any questions you have with your health care provider. Document Released: 10/29/2005 Document Revised: 07/05/2016 Document Reviewed: 07/15/2015 Elsevier Interactive Patient Education  2017 [employer].   Trochanteric Bursitis Rehab Ask your health care provider which exercises are safe for you. Do exercises exactly as  told by your health care provider and adjust them as directed. It is normal to feel mild stretching, pulling, tightness, or discomfort as you do these exercises, but you should stop right away if you feel sudden Candice [name] or your Candice [name] gets worse.Do not begin  these exercises until told by your health care provider. Stretching exercises These exercises warm up your muscles and joints and improve the movement and flexibility of your hip. These exercises also help to relieve Candice [name] and stiffness. Exercise A: Iliotibial band stretch 6. Lie on your side with your left / right leg in the top position. 7. Bend your left / right knee and grab your ankle. 8. Slowly bring your knee back so your thigh is behind your body. 9. Slowly lower your knee toward the floor until you feel a gentle stretch on the outside of your left / right thigh. If you do not feel a stretch and your knee will not fall farther, place the heel of your other foot on top of your outer knee and pull your thigh down farther. 10. Hold this position for __________ seconds. 11. Slowly return to the starting position. Repeat __________ times. Complete this exercise __________ times a day. Strengthening exercises These exercises build strength and endurance in your hip and pelvis. Endurance is the ability to use your muscles for a long time, even after they get tired. Exercise B: Bridge (hip extensors) 1. Lie on your back on a firm surface with your knees bent and your feet flat on the floor. 2. Tighten your buttocks muscles and lift your buttocks off the floor until your trunk is level with your thighs. You should feel the muscles working in your buttocks and the back of your thighs. If this exercise is too easy, try doing it with your arms crossed over your chest. 3. Hold this position for __________ seconds. 4. Slowly return to the starting position. 5. Let your muscles relax completely between repetitions. Repeat __________ times. Complete this exercise __________ times a day. Exercise C: Squats (knee extensors and  quadriceps) 6. Stand in front of a table, with your feet and knees pointing straight ahead. You may rest your hands on the table for balance but not for support. 7. Slowly bend  your knees and lower your hips like you are going to sit in a chair.  Keep your weight over your heels, not over your toes.  Keep your lower legs upright so they are parallel with the table legs.  Do not let your hips go lower than your knees.  Do not bend lower than told by your health care provider.  If your hip Candice [name] increases, do not bend as low. 8. Hold this position for __________ seconds. 9. Slowly push with your legs to return to standing. Do not use your hands to pull yourself to standing. Repeat __________ times. Complete this exercise __________ times a day. Exercise D: Hip hike 1. Stand sideways on a bottom step. Stand on your left / right leg with your other foot unsupported next to the step. You can hold onto the railing or wall if needed for balance. 2. Keeping your knees straight and your torso square, lift your left / right hip up toward the ceiling. 3. Hold this position for __________ seconds. 4. Slowly let your left / right hip lower toward the floor, past the starting position. Your foot should get closer to the floor. Do not lean or bend your knees. Repeat __________ times. Complete this exercise __________  times a day. Exercise E: Single leg stand 1. Stand near a counter or door frame that you can hold onto for balance as needed. It is helpful to stand in front of a mirror for this exercise so you can watch your hip. 2. Squeeze your left / right buttock muscles then lift up your other foot. Do not let your left / right hip push out to the side. 3. Hold this position for __________ seconds. Repeat __________ times. Complete this exercise __________ times a day. This information is not intended to replace advice given to you by your health care provider. Make sure you discuss any questions you have with your health care provider. Document Released: 12/06/2004 Document Revised: 07/05/2016 Document Reviewed: 10/14/2015 Elsevier Interactive Patient Education  2017  [employer].    IF you received an x-ray today, you will receive an invoice from [facility]. Please contact [facility] at [phone] with questions or concerns regarding your invoice.   IF you received labwork today, you will receive an invoice from [employer]. Please contact [facility] at [phone] with questions or concerns regarding your invoice.   Our billing staff will not be able to assist you with questions regarding bills from these companies.  You will be contacted with the lab results as soon as they are available. The fastest way to get your results is to activate your My Chart account. Instructions are located on the last page of this paperwork. If you have not heard from [place] regarding the results in 2 weeks, please contact this office.        Signed,   [name], MD [facility].  10/07/16 12:54 PM

## 2016-10-05 NOTE — Patient Instructions (Addendum)
Your leg symptoms may be due to a combination of mild sciatica, and trochanteric bursitis on the outside of your hip. Try the exercises below, Tylenol over-the-counter if needed, and Flexeril muscle relaxant if needed at bedtime. If your blood pressure is controlled, you can take Advil over-the-counter short-term only as needed.  Return in the next 3-4 weeks to recheck pains, and possible imaging at that time. Also can consider injection into the trochanteric bursa if still painful at that point.  Blood pressure elevated today, start low-dose losartan in addition to your other medication for now. Keep a record of those pressures outside the office, and follow-up in 3-4 weeks. Sooner if any new or worsening symptoms.  Sciatica Rehab Ask your health care provider which exercises are safe for you. Do exercises exactly as told by your health care provider and adjust them as directed. It is normal to feel mild stretching, pulling, tightness, or discomfort as you do these exercises, but you should stop right away if you feel sudden pain or your pain gets worse.Do not begin these exercises until told by your health care provider. Stretching and range of motion exercises These exercises warm up your muscles and joints and improve the movement and flexibility of your hips and your back. These exercises also help to relieve pain, numbness, and tingling. Exercise A: Sciatic nerve glide 1. Sit in a chair with your head facing down toward your chest. Place your hands behind your back. Let your shoulders slump forward. 2. Slowly straighten one of your knees while you tilt your head back as if you are looking toward the ceiling. Only straighten your leg as far as you can without making your symptoms worse. 3. Hold for __________ seconds. 4. Slowly return to the starting position. 5. Repeat with your other leg. Repeat __________ times. Complete this exercise __________ times a day. Exercise B: Knee to chest with  hip adduction and internal rotation 1. Lie on your back on a firm surface with both legs straight. 2. Bend one of your knees and move it up toward your chest until you feel a gentle stretch in your lower back and buttock. Then, move your knee toward the shoulder that is on the opposite side from your leg.  Hold your leg in this position by holding onto the front of your knee. 3. Hold for __________ seconds. 4. Slowly return to the starting position. 5. Repeat with your other leg. Repeat __________ times. Complete this exercise __________ times a day. Exercise C: Prone extension on elbows 1. Lie on your abdomen on a firm surface. A bed may be too soft for this exercise. 2. Prop yourself up on your elbows. 3. Use your arms to help lift your chest up until you feel a gentle stretch in your abdomen and your lower back.  This will place some of your body weight on your elbows. If this is uncomfortable, try stacking pillows under your chest.  Your hips should stay down, against the surface that you are lying on. Keep your hip and back muscles relaxed. 4. Hold for __________ seconds. 5. Slowly relax your upper body and return to the starting position. Repeat __________ times. Complete this exercise __________ times a day. Strengthening exercises These exercises build strength and endurance in your back. Endurance is the ability to use your muscles for a long time, even after they get tired. Exercise D: Pelvic tilt 1. Lie on your back on a firm surface. Bend your knees and keep your feet  flat. 2. Tense your abdominal muscles. Tip your pelvis up toward the ceiling and flatten your lower back into the floor.  To help with this exercise, you may place a small towel under your lower back and try to push your back into the towel. 3. Hold for __________ seconds. 4. Let your muscles relax completely before you repeat this exercise. Repeat __________ times. Complete this exercise __________ times a  day. Exercise E: Alternating arm and leg raises 1. Get on your hands and knees on a firm surface. If you are on a hard floor, you may want to use padding to cushion your knees, such as an exercise mat. 2. Line up your arms and legs. Your hands should be below your shoulders, and your knees should be below your hips. 3. Lift your left leg behind you. At the same time, raise your right arm and straighten it in front of you.  Do not lift your leg higher than your hip.  Do not lift your arm higher than your shoulder.  Keep your abdominal and back muscles tight.  Keep your hips facing the ground.  Do not arch your back.  Keep your balance carefully, and do not hold your breath. 4. Hold for __________ seconds. 5. Slowly return to the starting position and repeat with your right leg and your left arm. Repeat __________ times. Complete this exercise __________ times a day. Posture and body mechanics   Body mechanics refers to the movements and positions of your body while you do your daily activities. Posture is part of body mechanics. Good posture and healthy body mechanics can help to relieve stress in your body's tissues and joints. Good posture means that your spine is in its natural S-curve position (your spine is neutral), your shoulders are pulled back slightly, and your head is not tipped forward. The following are general guidelines for applying improved posture and body mechanics to your everyday activities. Standing   When standing, keep your spine neutral and your feet about hip-width apart. Keep a slight bend in your knees. Your ears, shoulders, and hips should line up.  When you do a task in which you stand in one place for a long time, place one foot up on a stable object that is 2-4 inches (5-10 cm) high, such as a footstool. This helps keep your spine neutral. Sitting  When sitting, keep your spine neutral and keep your feet flat on the floor. Use a footrest, if necessary, and  keep your thighs parallel to the floor. Avoid rounding your shoulders, and avoid tilting your head forward.  When working at a desk or a computer, keep your desk at a height where your hands are slightly lower than your elbows. Slide your chair under your desk so you are close enough to maintain good posture.  When working at a computer, place your monitor at a height where you are looking straight ahead and you do not have to tilt your head forward or downward to look at the screen. Resting   When lying down and resting, avoid positions that are most painful for you.  If you have pain with activities such as sitting, bending, stooping, or squatting (flexion-based activities), lie in a position in which your body does not bend very much. For example, avoid curling up on your side with your arms and knees near your chest (fetal position).  If you have pain with activities such as standing for a long time or reaching with your arms (  extension-based activities), lie with your spine in a neutral position and bend your knees slightly. Try the following positions:  Lying on your side with a pillow between your knees.  Lying on your back with a pillow under your knees. Lifting   When lifting objects, keep your feet at least shoulder-width apart and tighten your abdominal muscles.  Bend your knees and hips and keep your spine neutral. It is important to lift using the strength of your legs, not your back. Do not lock your knees straight out.  Always ask for help to lift heavy or awkward objects. This information is not intended to replace advice given to you by your health care provider. Make sure you discuss any questions you have with your health care provider. Document Released: 10/29/2005 Document Revised: 07/05/2016 Document Reviewed: 07/15/2015 Elsevier Interactive Patient Education  2017 [place].   Trochanteric Bursitis Rehab Ask your health care provider which exercises are safe  for you. Do exercises exactly as told by your health care provider and adjust them as directed. It is normal to feel mild stretching, pulling, tightness, or discomfort as you do these exercises, but you should stop right away if you feel sudden pain or your pain gets worse.Do not begin these exercises until told by your health care provider. Stretching exercises These exercises warm up your muscles and joints and improve the movement and flexibility of your hip. These exercises also help to relieve pain and stiffness. Exercise A: Iliotibial band stretch 6. Lie on your side with your left / right leg in the top position. 7. Bend your left / right knee and grab your ankle. 8. Slowly bring your knee back so your thigh is behind your body. 9. Slowly lower your knee toward the floor until you feel a gentle stretch on the outside of your left / right thigh. If you do not feel a stretch and your knee will not fall farther, place the heel of your other foot on top of your outer knee and pull your thigh down farther. 10. Hold this position for __________ seconds. 11. Slowly return to the starting position. Repeat __________ times. Complete this exercise __________ times a day. Strengthening exercises These exercises build strength and endurance in your hip and pelvis. Endurance is the ability to use your muscles for a long time, even after they get tired. Exercise B: Bridge (hip extensors) 1. Lie on your back on a firm surface with your knees bent and your feet flat on the floor. 2. Tighten your buttocks muscles and lift your buttocks off the floor until your trunk is level with your thighs. You should feel the muscles working in your buttocks and the back of your thighs. If this exercise is too easy, try doing it with your arms crossed over your chest. 3. Hold this position for __________ seconds. 4. Slowly return to the starting position. 5. Let your muscles relax completely between repetitions. Repeat  __________ times. Complete this exercise __________ times a day. Exercise C: Squats (knee extensors and  quadriceps) 6. Stand in front of a table, with your feet and knees pointing straight ahead. You may rest your hands on the table for balance but not for support. 7. Slowly bend your knees and lower your hips like you are going to sit in a chair.  Keep your weight over your heels, not over your toes.  Keep your lower legs upright so they are parallel with the table legs.  Do not let your hips  go lower than your knees.  Do not bend lower than told by your health care provider.  If your hip pain increases, do not bend as low. 8. Hold this position for __________ seconds. 9. Slowly push with your legs to return to standing. Do not use your hands to pull yourself to standing. Repeat __________ times. Complete this exercise __________ times a day. Exercise D: Hip hike 1. Stand sideways on a bottom step. Stand on your left / right leg with your other foot unsupported next to the step. You can hold onto the railing or wall if needed for balance. 2. Keeping your knees straight and your torso square, lift your left / right hip up toward the ceiling. 3. Hold this position for __________ seconds. 4. Slowly let your left / right hip lower toward the floor, past the starting position. Your foot should get closer to the floor. Do not lean or bend your knees. Repeat __________ times. Complete this exercise __________ times a day. Exercise E: Single leg stand 1. Stand near a counter or door frame that you can hold onto for balance as needed. It is helpful to stand in front of a mirror for this exercise so you can watch your hip. 2. Squeeze your left / right buttock muscles then lift up your other foot. Do not let your left / right hip push out to the side. 3. Hold this position for __________ seconds. Repeat __________ times. Complete this exercise __________ times a day. This information is not  intended to replace advice given to you by your health care provider. Make sure you discuss any questions you have with your health care provider. Document Released: 12/06/2004 Document Revised: 07/05/2016 Document Reviewed: 10/14/2015 Elsevier Interactive Patient Education  2017 [employer].    IF you received an x-ray today, you will receive an invoice from [facility]. Please contact [facility] at [phone] with questions or concerns regarding your invoice.   IF you received labwork today, you will receive an invoice from [employer]. Please contact [facility] at [phone] with questions or concerns regarding your invoice.   Our billing staff will not be able to assist you with questions regarding bills from these companies.  You will be contacted with the lab results as soon as they are available. The fastest way to get your results is to activate your My Chart account. Instructions are located on the last page of this paperwork. If you have not heard from [place] regarding the results in 2 weeks, please contact this office.

## 2016-10-11 ENCOUNTER — Other Ambulatory Visit: Payer: Self-pay | Admitting: Family Medicine

## 2016-10-11 DIAGNOSIS — M5442 Lumbago with sciatica, left side: Secondary | ICD-10-CM

## 2016-10-14 NOTE — Telephone Encounter (Signed)
Last visit 10/05/16 with rx for #15

## 2016-10-14 NOTE — Telephone Encounter (Signed)
Refilled

## 2016-10-24 ENCOUNTER — Other Ambulatory Visit: Payer: Self-pay

## 2016-10-24 DIAGNOSIS — M79671 Pain in right foot: Secondary | ICD-10-CM

## 2016-10-24 MED ORDER — COLCHICINE 0.6 MG PO TABS: ORAL_TABLET | ORAL | 0 refills | Status: DC

## 2016-10-24 NOTE — Telephone Encounter (Signed)
Seen 02/2016 with Uric acid reported @ 10.4 Seen 09/2016 -hypertension  Sent one refill with note to follow up by Mar 2017 for uric acid labs & evaluation

## 2016-10-31 ENCOUNTER — Ambulatory Visit (INDEPENDENT_AMBULATORY_CARE_PROVIDER_SITE_OTHER): Payer: BC Managed Care – PPO | Admitting: Family Medicine

## 2016-10-31 DIAGNOSIS — I10 Essential (primary) hypertension: Secondary | ICD-10-CM

## 2016-10-31 MED ORDER — LOSARTAN POTASSIUM 50 MG PO TABS: 50.0000 mg | ORAL_TABLET | Freq: Every day | ORAL | 1 refills | Status: DC

## 2016-10-31 NOTE — Progress Notes (Signed)
Subjective:  By signing my name below, I, [name], attest that this documentation has been prepared under the direction and in the presence of Candice Ray, MD.  Electronically Signed: Thea Alken, ED Scribe. 10/31/2016. 12:51 PM.   Patient ID: Candice Candice [name], female    DOB: June 08, 1961, 55 y.o.   MRN: [id number]  HPI Chief Complaint  Patient presents with  . Follow-up    Medication    HPI Comments: Candice Candice [name] is a 55 y.o. female who presents to the [facility] for a follow up HTN. She was last seen 11/24, BP at that time BP was 148/46. Also had elevated reading outside of office of 140/90. She felt like valsartan had caused joint pains in 05/2016. This symptoms improved after stopping medication. Thought to have some trochanteric bursitis as well as sciatic of left hip at last visit treated with home exercise tylenol and advil. She was started on losartan 25 mg last visit.  Lab Results  Component Value Date   CREATININE 1.04 05/18/2016    Pt reports home reading are still elevated. She reports outside readings of 139. She has been taking atenolol 50 mg once a day and a half tablet of losartan 50mg . She  Denies CP, SOB and trouble breathing. She has not experienced joint Candice [name]. She has muscle relaxer at home if needed.  Patient Active Problem List   Diagnosis Date Noted  . Cardiomegaly 11/06/2014  . HTN (hypertension) 08/19/2013  . Gout 08/19/2013  . Allergic urticaria 06/23/2013  . Contact dermatitis 07/16/2012   Past Medical History:  Diagnosis Date  . Allergy   . Heart murmur   . Hypertension    Past Surgical History:  Procedure Laterality Date  . OVARIAN CYST REMOVAL Left    20 yrs ago  . TUBAL LIGATION     Allergies  Allergen Reactions  . Ace Inhibitors Nausea And Vomiting   Prior to Admission medications   Medication Sig Start Date End Date Taking? Authorizing Provider  atenolol (TENORMIN) 50 MG tablet Take 1 tablet (50 mg total) by  mouth daily. 05/18/16  Yes Wendie Agreste, MD  colchicine 0.6 MG tablet 1-2 tabs at onset of toe Candice [name]. Take once per flair only. 10/24/16  Yes Wendie Agreste, MD  cyclobenzaprine (FLEXERIL) 5 MG tablet TAKE 1/2 TO 1 TABLET BY MOUTH AT BEDTIME AS NEEDED 10/14/16  Yes Wendie Agreste, MD  Emollient (EUCERIN) lotion Apply topically as needed for dry skin. 04/04/14  Yes Roselee Culver, MD  levocetirizine (XYZAL) 5 MG tablet Take 1 tablet (5 mg total) by mouth every evening. 01/02/16  Yes Wendie Agreste, MD  losartan (COZAAR) 50 MG tablet Take 0.5 tablets (25 mg total) by mouth daily. 10/05/16  Yes Wendie Agreste, MD  triamcinolone cream (KENALOG) 0.1 % Apply 1 application topically 2 (two) times daily as needed. 05/30/16  Yes Wendie Agreste, MD   Social History   Social History  . Marital status: Divorced    Spouse name: N/A  . Number of children: N/A  . Years of education: N/A   Occupational History  . Not on file.   Social History Main Topics  . Smoking status: Never Smoker  . Smokeless tobacco: Never Used  . Alcohol use No  . Drug use: No  . Sexual activity: Yes    Birth control/ protection: Surgical   Other Topics Concern  . Not on file   Social History Narrative  .  No narrative on file   Review of Systems  Constitutional: Negative for fatigue and unexpected weight change.  Respiratory: Negative for chest tightness and shortness of breath.   Cardiovascular: Negative for chest Candice [name], palpitations and leg swelling.  Gastrointestinal: Negative for abdominal Candice [name] and blood in stool.  Neurological: Negative for dizziness, syncope, light-headedness and headaches.       Objective:   Physical Exam  Constitutional: She is oriented to person, place, and time. She appears well-developed and well-nourished.  HENT:  Head: Normocephalic and atraumatic.  Eyes: Conjunctivae and EOM are normal. Pupils are equal, round, and reactive to light.  Neck: Carotid bruit is not present.   Cardiovascular: Normal rate, regular rhythm, normal heart sounds and intact distal pulses.   Pulmonary/Chest: Effort normal and breath sounds normal.  Abdominal: Soft. She exhibits no pulsatile midline mass. There is no tenderness.  Neurological: She is alert and oriented to person, place, and time.  Skin: Skin is warm and dry.  Psychiatric: She has a normal mood and affect. Her behavior is normal.  Vitals reviewed.  Vitals:   10/31/16 1222  BP: (!) 158/82  Pulse: (!) 50  Temp: 97.7 F (36.5 C)  TempSrc: Oral  SpO2: 100%  Weight: 197 lb 12.8 oz (89.7 kg)  Height: 5' 4.5" (1.638 m)    Assessment & Plan:   Candice Candice [name] is a 55 y.o. female Essential hypertension - Plan: Basic metabolic panel, losartan (COZAAR) 50 MG tablet Incomplete control, increase losartan to 50 mg daily as tolerated without previous arthralgias seen with valsartan. Continue Tylenol same dose for now, but discussed bradycardia and would consider D/C beta blocker if any orthostatic symptoms. Check BMP.   Back Candice [name], sciatic symptoms have improved, continue home exercise program, episodic Flexeril if needed, RTC precautions.  Meds ordered this encounter  Medications  . losartan (COZAAR) 50 MG tablet    Sig: Take 1 tablet (50 mg total) by mouth daily.    Dispense:  90 tablet    Refill:  1   Patient Instructions    Increase to 1 pill per day of the losartan. Follow-up with me in 6 months, sooner if blood pressures are running too high or you're having side effects with that medication. As we discussed your heart rate is on the lower side, so if any lightheadedness, dizziness, or increasing fatigue, would change the atenolol to a different medication. Let me know if you have questions.    IF you received an x-[name] today, you will receive an invoice from [facility]. Please contact [facility] at [phone] with questions or concerns regarding your invoice.   IF you received labwork today,  you will receive an invoice from [facility]. Please contact [facility] at [phone] with questions or concerns regarding your invoice.   Our billing staff will not be able to assist you with questions regarding bills from these companies.  You will be contacted with the lab results as soon as they are available. The fastest way to get your results is to activate your My Chart account. Instructions are located on the last page of this paperwork. If you have not heard from [place] regarding the results in 2 weeks, please contact this office.       I personally performed the services described in this documentation, which was scribed in my presence. The recorded information has been reviewed and considered, and addended by me as needed.   Signed,   Candice Ray, MD [facility].  10/31/16 1:41 PM

## 2016-10-31 NOTE — Patient Instructions (Addendum)
  Increase to 1 pill per day of the losartan. Follow-up with me in 6 months, sooner if blood pressures are running too high or you're having side effects with that medication. As we discussed your heart rate is on the lower side, so if any lightheadedness, dizziness, or increasing fatigue, would change the atenolol to a different medication. Let me know if you have questions.    IF you received an x-ray today, you will receive an invoice from [facility]. Please contact [facility] at [phone] with questions or concerns regarding your invoice.   IF you received labwork today, you will receive an invoice from [facility]. Please contact [facility] at [phone] with questions or concerns regarding your invoice.   Our billing staff will not be able to assist you with questions regarding bills from these companies.  You will be contacted with the lab results as soon as they are available. The fastest way to get your results is to activate your My Chart account. Instructions are located on the last page of this paperwork. If you have not heard from [place] regarding the results in 2 weeks, please contact this office.

## 2016-11-01 LAB — BASIC METABOLIC PANEL
BUN / CREAT RATIO: 16 (ref 9–23)
BUN: 17 mg/dL (ref 6–24)
CO2: 21 mmol/L (ref 18–29)
CREATININE: 1.06 mg/dL — AB (ref 0.57–1.00)
Calcium: 9.2 mg/dL (ref 8.7–10.2)
Chloride: 103 mmol/L (ref 96–106)
GFR calc Af Amer: 67 mL/min/{1.73_m2} (ref >59)
GFR, EST NON AFRICAN AMERICAN: 58 mL/min/{1.73_m2} — AB (ref >59)
GLUCOSE: 56 mg/dL — AB (ref 65–99)
Potassium: 4.4 mmol/L (ref 3.5–5.2)
Sodium: 140 mmol/L (ref 134–144)

## 2016-11-13 ENCOUNTER — Telehealth: Payer: Self-pay | Admitting: Emergency Medicine

## 2016-11-13 NOTE — Telephone Encounter (Signed)
-----   Message from Wendie Agreste, MD sent at 11/10/2016 11:11 PM EST ----- Call patient.  Electrolytes overall ok.  Kidney function borderline elevated, but ok when compared to prior readings. Blood sugar was borderline low, but usually not significant if not on diabetes medication. If any low blood sugar symptoms (fatigue, lightheadedness, headaches, or sweating during those episodes) at the time of a lower blood sugar, then can look into causes. Return if any new symptoms.

## 2016-11-17 ENCOUNTER — Other Ambulatory Visit: Payer: Self-pay | Admitting: Family Medicine

## 2016-11-17 DIAGNOSIS — I10 Essential (primary) hypertension: Secondary | ICD-10-CM

## 2016-11-19 ENCOUNTER — Other Ambulatory Visit: Payer: Self-pay | Admitting: Family Medicine

## 2017-01-09 ENCOUNTER — Other Ambulatory Visit: Payer: Self-pay | Admitting: Family Medicine

## 2017-01-09 DIAGNOSIS — L578 Other skin changes due to chronic exposure to nonionizing radiation: Secondary | ICD-10-CM

## 2017-01-09 DIAGNOSIS — L501 Idiopathic urticaria: Secondary | ICD-10-CM

## 2017-02-14 ENCOUNTER — Other Ambulatory Visit: Payer: Self-pay | Admitting: Family Medicine

## 2017-04-10 ENCOUNTER — Encounter: Payer: Self-pay | Admitting: Family Medicine

## 2017-04-10 ENCOUNTER — Ambulatory Visit (INDEPENDENT_AMBULATORY_CARE_PROVIDER_SITE_OTHER): Payer: BC Managed Care – PPO | Admitting: Family Medicine

## 2017-04-10 VITALS — BP 131/85 | HR 55 | Temp 97.6°F | Resp 18 | Ht 64.5 in | Wt 213.0 lb

## 2017-04-10 DIAGNOSIS — M109 Gout, unspecified: Secondary | ICD-10-CM

## 2017-04-10 MED ORDER — PREDNISONE 20 MG PO TABS: 40.0000 mg | ORAL_TABLET | Freq: Every day | ORAL | 0 refills | Status: DC

## 2017-04-10 NOTE — Patient Instructions (Addendum)
Toe pain does appear to be a flare of gout. Based on the timing of your symptoms, I believe we should start prednisone. Take 2 pills per day for the next 5 days. As we discussed that can increase appetite, so choose healthier foods for snacks. Follow-up with me in one month for blood pressure discussion, fasting lab work, and we can check a uric acid at that time. If it is elevated, can discuss using allopurinol again.  Return to the clinic or go to the nearest emergency room if any of your symptoms worsen or new symptoms occur.   Gout Gout is painful swelling that can occur in some of your joints. Gout is a type of arthritis. This condition is caused by having too much uric acid in your body. Uric acid is a chemical that forms when your body breaks down substances called purines. Purines are important for building body proteins. When your body has too much uric acid, sharp crystals can form and build up inside your joints. This causes pain and swelling. Gout attacks can happen quickly and be very painful (acute gout). Over time, the attacks can affect more joints and become more frequent (chronic gout). Gout can also cause uric acid to build up under your skin and inside your kidneys. What are the causes? This condition is caused by too much uric acid in your blood. This can occur because:  Your kidneys do not remove enough uric acid from your blood. This is the most common cause.  Your body makes too much uric acid. This can occur with some cancers and cancer treatments. It can also occur if your body is breaking down too many red blood cells (hemolytic anemia).  You eat too many foods that are high in purines. These foods include organ meats and some seafood. Alcohol, especially beer, is also high in purines. A gout attack may be triggered by trauma or stress. What increases the risk? This condition is more likely to develop in people who:  Have a family history of gout.  Are female and  middle-aged.  Are female and have gone through menopause.  Are obese.  Frequently drink alcohol, especially beer.  Are dehydrated.  Lose weight too quickly.  Have an organ transplant.  Have lead poisoning.  Take certain medicines, including aspirin, cyclosporine, diuretics, levodopa, and niacin.  Have kidney disease or psoriasis. What are the signs or symptoms? An attack of acute gout happens quickly. It usually occurs in just one joint. The most common place is the big toe. Attacks often start at night. Other joints that may be affected include joints of the feet, ankle, knee, fingers, wrist, or elbow. Symptoms may include:  Severe pain.  Warmth.  Swelling.  Stiffness.  Tenderness. The affected joint may be very painful to touch.  Shiny, red, or purple skin.  Chills and fever. Chronic gout may cause symptoms more frequently. More joints may be involved. You may also have white or yellow lumps (tophi) on your hands or feet or in other areas near your joints. How is this diagnosed? This condition is diagnosed based on your symptoms, medical history, and physical exam. You may have tests, such as:  Blood tests to measure uric acid levels.  Removal of joint fluid with a needle (aspiration) to look for uric acid crystals.  X-rays to look for joint damage. How is this treated? Treatment for this condition has two phases: treating an acute attack and preventing future attacks. Acute gout treatment may include  medicines to reduce pain and swelling, including:  NSAIDs.  Steroids. These are strong anti-inflammatory medicines that can be taken by mouth (orally) or injected into a joint.  Colchicine. This medicine relieves pain and swelling when it is taken soon after an attack. It can be given orally or through an IV tube. Preventive treatment may include:  Daily use of smaller doses of NSAIDs or colchicine.  Use of a medicine that reduces uric acid levels in your  blood.  Changes to your diet. You may need to see a specialist about healthy eating (dietitian). Follow these instructions at home: During a Gout Attack   If directed, apply ice to the affected area:  Put ice in a plastic bag.  Place a towel between your skin and the bag.  Leave the ice on for 20 minutes, 2-3 times a day.  Rest the joint as much as possible. If the affected joint is in your leg, you may be given crutches to use.  Raise (elevate) the affected joint above the level of your heart as often as possible.  Drink enough fluids to keep your urine clear or pale yellow.  Take over-the-counter and prescription medicines only as told by your health care provider.  Do not drive or operate heavy machinery while taking prescription pain medicine.  Follow instructions from your health care provider about eating or drinking restrictions.  Return to your normal activities as told by your health care provider. Ask your health care provider what activities are safe for you. Avoiding Future Gout Attacks   Follow a low-purine diet as told by your dietitian or health care provider. Avoid foods and drinks that are high in purines, including liver, kidney, anchovies, asparagus, herring, mushrooms, mussels, and beer.  Limit alcohol intake to no more than 1 drink a day for nonpregnant women and 2 drinks a day for men. One drink equals 12 oz of beer, 5 oz of wine, or 1 oz of hard liquor.  Maintain a healthy weight or lose weight if you are overweight. If you want to lose weight, talk with your health care provider. It is important that you do not lose weight too quickly.  Start or maintain an exercise program as told by your health care provider.  Drink enough fluids to keep your urine clear or pale yellow.  Take over-the-counter and prescription medicines only as told by your health care provider.  Keep all follow-up visits as told by your health care provider. This is  important. Contact a health care provider if:  You have another gout attack.  You continue to have symptoms of a gout attack after10 days of treatment.  You have side effects from your medicines.  You have chills or a fever.  You have burning pain when you urinate.  You have pain in your lower back or belly. Get help right away if:  You have severe or uncontrolled pain.  You cannot urinate. This information is not intended to replace advice given to you by your health care provider. Make sure you discuss any questions you have with your health care provider. Document Released: 10/26/2000 Document Revised: 04/05/2016 Document Reviewed: 08/11/2015 Elsevier Interactive Patient Education  2017 [employer].   IF you received an x-ray today, you will receive an invoice from [facility]. Please contact [facility] at [phone] with questions or concerns regarding your invoice.   IF you received labwork today, you will receive an invoice from [place]. Please contact [facility] at [phone] with questions  or concerns regarding your invoice.   Our billing staff will not be able to assist you with questions regarding bills from these companies.  You will be contacted with the lab results as soon as they are available. The fastest way to get your results is to activate your My Chart account. Instructions are located on the last page of this paperwork. If you have not heard from [place] regarding the results in 2 weeks, please contact this office.

## 2017-04-10 NOTE — Progress Notes (Signed)
Subjective:  This chart was scribed for Wendie Agreste, MD ,by Tamsen Roers, at [place] at [facility].  This patient was seen in room 5 and the patient's care was started at 11:42 AM.   Chief Complaint  Patient presents with  . Toe [name]    right big toe [name] x3 weeks      Patient ID: Candice [name], female    DOB: Oct 08, 1961, 56 y.o.   MRN: [id number]  HPI HPI Comments: Candice [name] is a 56 y.o. female who presents to Primary Care at [facility] for right big toe [name] onset three weeks ago. She has a history of gout, last uric acid April 2017 was 10.4.  Option of allopurinol was dicussed at that time. She is not taking a thiazide diuretic. Has been prescribed colchicine in the past. --- Patient states that when her toe [name] initially started, it was red, hot, swollen and painful to touch.  She took Colchicine- 2 tablets when her symptoms first started and one more pill about 1 hour later.  She states her symptoms improved after taking her medication and started again about a week later. She did the same medication regime when it started again and states that her symptoms once again improved but came back about two days ago.  Her gout has not flared up for about 6 months prior to her flare up three weeks ago. Patient has been trying to keep track of what is causing her flare ups and has been avoiding many things she loves to eat.     Patient Active Problem List   Diagnosis Date Noted  . Cardiomegaly 11/06/2014  . HTN (hypertension) 08/19/2013  . Gout 08/19/2013  . Allergic urticaria 06/23/2013  . Contact dermatitis 07/16/2012   Past Medical History:  Diagnosis Date  . Allergy   . Heart murmur   . Hypertension    Past Surgical History:  Procedure Laterality Date  . OVARIAN CYST REMOVAL Left    20 yrs ago  . TUBAL LIGATION     Allergies  Allergen Reactions  . Ace Inhibitors Nausea And Vomiting   Prior to Admission medications   Medication Sig Start Date End Date  Taking? Authorizing Provider  atenolol (TENORMIN) 50 MG tablet TAKE 1 TABLET BY MOUTH EVERY DAY 02/15/17   Wendie Agreste, MD  colchicine 0.6 MG tablet 1-2 tabs at onset of toe [name]. Take once per flair only. 10/24/16   Wendie Agreste, MD  cyclobenzaprine (FLEXERIL) 5 MG tablet TAKE 1/2 TO 1 TABLET BY MOUTH AT BEDTIME AS NEEDED 10/14/16   Wendie Agreste, MD  Emollient (EUCERIN) lotion Apply topically as needed for dry skin. 04/04/14   Roselee Culver, MD  levocetirizine (XYZAL) 5 MG tablet TAKE 1 TABLET (5 MG TOTAL) BY MOUTH EVERY EVENING. 01/09/17   Tereasa Coop, PA-C  losartan (COZAAR) 50 MG tablet Take 1 tablet (50 mg total) by mouth daily. 10/31/16   Wendie Agreste, MD  triamcinolone cream (KENALOG) 0.1 % Apply 1 application topically 2 (two) times daily as needed. 05/30/16   Wendie Agreste, MD   Social History   Social History  . Marital status: Divorced    Spouse name: N/A  . Number of children: N/A  . Years of education: N/A   Occupational History  . Not on file.   Social History Main Topics  . Smoking status: Never Smoker  . Smokeless tobacco: Never Used  . Alcohol use No  .  Drug use: No  . Sexual activity: Yes    Birth control/ protection: Surgical   Other Topics Concern  . Not on file   Social History Narrative  . No narrative on file      Review of Systems  Constitutional: Negative for chills and fever.  Eyes: Negative for [name] and redness.  Respiratory: Negative for choking.   Gastrointestinal: Negative for nausea and vomiting.  Musculoskeletal: Negative for neck [name] and neck stiffness.       Toe [name]  Neurological: Negative for speech difficulty.       Objective:   Physical Exam  Constitutional: She appears well-developed and well-nourished.  HENT:  Head: Normocephalic and atraumatic.  Cardiovascular:  Pulses:      Dorsalis pedis pulses are 2+ on the right side.  Pulmonary/Chest: No respiratory distress.  Musculoskeletal:    Right: some warmth with soft tissue swelling and tenderness at the right first MTP. No rash or skin break down.   Neurological: She is alert.  Skin: Skin is warm and dry.   Vitals:   04/10/17 1121  BP: 131/85  Pulse: (!) 55  Resp: 18  Temp: 97.6 F (36.4 C)  TempSrc: Oral  SpO2: 100%  Weight: 213 lb (96.6 kg)  Height: 5' 4.5" (1.638 m)        Assessment & Plan:    Candice [name] is a 56 y.o. female Acute gout involving toe of right foot, unspecified cause [place], acute gout of right foot. Persistent symptoms for 3 weeks, with intermittent improvement with colchicine.   -Due to timing of symptoms, will start prednisone, 40 mg daily for 5 days. Potential side effects discussed.  - Recheck for hypertension follow-up, lipids, and uric acid testing in one month. If uric acid elevated at that time, would consider allopurinol, especially if frequent flares of gout.  Meds ordered this encounter  Medications  . predniSONE (DELTASONE) 20 MG tablet    Sig: Take 2 tablets (40 mg total) by mouth daily with breakfast.    Dispense:  10 tablet    Refill:  0   Patient Instructions    Toe [name] does appear to be a flare of gout. Based on the timing of your symptoms, I believe we should start prednisone. Take 2 pills per day for the next 5 days. As we discussed that can increase appetite, so choose healthier foods for snacks. Follow-up with me in one month for blood pressure discussion, fasting lab work, and we can check a uric acid at that time. If it is elevated, can discuss using allopurinol again.  Return to the clinic or go to the nearest emergency room if any of your symptoms worsen or new symptoms occur.   Gout Gout is painful swelling that can occur in some of your joints. Gout is a type of arthritis. This condition is caused by having too much uric acid in your body. Uric acid is a chemical that forms when your body breaks down substances called purines. Purines are important  for building body proteins. When your body has too much uric acid, sharp crystals can form and build up inside your joints. This causes [name] and swelling. Gout attacks can happen quickly and be very painful (acute gout). Over time, the attacks can affect more joints and become more frequent (chronic gout). Gout can also cause uric acid to build up under your skin and inside your kidneys. What are the causes? This condition is caused by too much uric acid  in your blood. This can occur because:  Your kidneys do not remove enough uric acid from your blood. This is the most common cause.  Your body makes too much uric acid. This can occur with some cancers and cancer treatments. It can also occur if your body is breaking down too many red blood cells (hemolytic anemia).  You eat too many foods that are high in purines. These foods include organ meats and some seafood. Alcohol, especially beer, is also high in purines. A gout attack may be triggered by trauma or stress. What increases the risk? This condition is more likely to develop in people who:  Have a family history of gout.  Are female and middle-aged.  Are female and have gone through menopause.  Are obese.  Frequently drink alcohol, especially beer.  Are dehydrated.  Lose weight too quickly.  Have an organ transplant.  Have lead poisoning.  Take certain medicines, including aspirin, cyclosporine, diuretics, levodopa, and niacin.  Have kidney disease or psoriasis. What are the signs or symptoms? An attack of acute gout happens quickly. It usually occurs in just one joint. The most common place is the big toe. Attacks often start at night. Other joints that may be affected include joints of the feet, ankle, knee, fingers, wrist, or elbow. Symptoms may include:  Severe [name].  Warmth.  Swelling.  Stiffness.  Tenderness. The affected joint may be very painful to touch.  Shiny, red, or purple skin.  Chills and  fever. Chronic gout may cause symptoms more frequently. More joints may be involved. You may also have white or yellow lumps (tophi) on your hands or feet or in other areas near your joints. How is this diagnosed? This condition is diagnosed based on your symptoms, medical history, and physical exam. You may have tests, such as:  Blood tests to measure uric acid levels.  Removal of joint fluid with a needle (aspiration) to look for uric acid crystals.  X-rays to look for joint damage. How is this treated? Treatment for this condition has two phases: treating an acute attack and preventing future attacks. Acute gout treatment may include medicines to reduce [name] and swelling, including:  NSAIDs.  Steroids. These are strong anti-inflammatory medicines that can be taken by mouth (orally) or injected into a joint.  Colchicine. This medicine relieves [name] and swelling when it is taken soon after an attack. It can be given orally or through an IV tube. Preventive treatment may include:  Daily use of smaller doses of NSAIDs or colchicine.  Use of a medicine that reduces uric acid levels in your blood.  Changes to your diet. You may need to see a specialist about healthy eating (dietitian). Follow these instructions at home: During a Gout Attack   If directed, apply ice to the affected area:  Put ice in a plastic bag.  Place a towel between your skin and the bag.  Leave the ice on for 20 minutes, 2-3 times a day.  Rest the joint as much as possible. If the affected joint is in your leg, you may be given crutches to use.  Raise (elevate) the affected joint above the level of your heart as often as possible.  Drink enough fluids to keep your urine clear or pale yellow.  Take over-the-counter and prescription medicines only as told by your health care provider.  Do not drive or operate heavy machinery while taking prescription [name] medicine.  Follow instructions from your health  care provider about  eating or drinking restrictions.  Return to your normal activities as told by your health care provider. Ask your health care provider what activities are safe for you. Avoiding Future Gout Attacks   Follow a low-purine diet as told by your dietitian or health care provider. Avoid foods and drinks that are high in purines, including liver, kidney, anchovies, asparagus, herring, mushrooms, mussels, and beer.  Limit alcohol intake to no more than 1 drink a day for nonpregnant women and 2 drinks a day for men. One drink equals 12 oz of beer, 5 oz of wine, or 1 oz of hard liquor.  Maintain a healthy weight or lose weight if you are overweight. If you want to lose weight, talk with your health care provider. It is important that you do not lose weight too quickly.  Start or maintain an exercise program as told by your health care provider.  Drink enough fluids to keep your urine clear or pale yellow.  Take over-the-counter and prescription medicines only as told by your health care provider.  Keep all follow-up visits as told by your health care provider. This is important. Contact a health care provider if:  You have another gout attack.  You continue to have symptoms of a gout attack after10 days of treatment.  You have side effects from your medicines.  You have chills or a fever.  You have burning [name] when you urinate.  You have [name] in your lower back or belly. Get help right away if:  You have severe or uncontrolled [name].  You cannot urinate. This information is not intended to replace advice given to you by your health care provider. Make sure you discuss any questions you have with your health care provider. Document Released: 10/26/2000 Document Revised: 04/05/2016 Document Reviewed: 08/11/2015 Elsevier Interactive Patient Education  2017 [employer].   IF you received an x-ray today, you will receive an invoice from [facility] Radiology. Please  contact [facility] at [phone] with questions or concerns regarding your invoice.   IF you received labwork today, you will receive an invoice from [place]. Please contact [facility] at [phone] with questions or concerns regarding your invoice.   Our billing staff will not be able to assist you with questions regarding bills from these companies.  You will be contacted with the lab results as soon as they are available. The fastest way to get your results is to activate your My Chart account. Instructions are located on the last page of this paperwork. If you have not heard from [place] regarding the results in 2 weeks, please contact this office.       I personally performed the services described in this documentation, which was scribed in my presence. The recorded information has been reviewed and considered for accuracy and completeness, addended by me as needed, and agree with information above.  Signed,   [name], MD Primary [facility] at [place].  04/10/17 11:57 AM

## 2017-04-15 ENCOUNTER — Telehealth: Payer: Self-pay | Admitting: Family Medicine

## 2017-04-15 NOTE — Telephone Encounter (Signed)
More info please. I did write her for prednisone last visit. Is she not improving on the prednisone, or need a longer course?

## 2017-04-15 NOTE — Telephone Encounter (Signed)
04/10/17 last ov for this, given prednisone

## 2017-04-15 NOTE — Telephone Encounter (Signed)
Pt calling to let Candice [name] know that her gout has flared up and she had some colchicine left and she took it and pt states that it didn't work please respond

## 2017-04-16 NOTE — Telephone Encounter (Signed)
PATIENT RETURNED KAREN'S CALL FROM EARLIER THIS MORNING. BEST PHONE [phone] (CELL) [place]

## 2017-04-16 NOTE — Telephone Encounter (Signed)
lmtcb and discuss in more detail

## 2017-04-18 ENCOUNTER — Encounter: Payer: Self-pay | Admitting: Emergency Medicine

## 2017-04-18 ENCOUNTER — Ambulatory Visit (INDEPENDENT_AMBULATORY_CARE_PROVIDER_SITE_OTHER): Payer: BC Managed Care – PPO | Admitting: Emergency Medicine

## 2017-04-18 VITALS — BP 146/88 | HR 55 | Temp 98.4°F | Resp 16 | Ht 64.5 in | Wt 215.0 lb

## 2017-04-18 DIAGNOSIS — I10 Essential (primary) hypertension: Secondary | ICD-10-CM

## 2017-04-18 DIAGNOSIS — M109 Gout, unspecified: Secondary | ICD-10-CM | POA: Diagnosis not present

## 2017-04-18 MED ORDER — PREDNISONE 20 MG PO TABS: 40.0000 mg | ORAL_TABLET | Freq: Every day | ORAL | 0 refills | Status: AC

## 2017-04-18 MED ORDER — INDOMETHACIN 50 MG PO CAPS: 50.0000 mg | ORAL_CAPSULE | Freq: Two times a day (BID) | ORAL | 1 refills | Status: AC

## 2017-04-18 NOTE — Patient Instructions (Addendum)
IF you received an x-ray today, you will receive an invoice from [facility] Radiology. Please contact [facility] Radiology at [phone] with questions or concerns regarding your invoice.   IF you received labwork today, you will receive an invoice from [facility]. Please contact [facility] at [phone] with questions or concerns regarding your invoice.   Our billing staff will not be able to assist you with questions regarding bills from these companies.  You will be contacted with the lab results as soon as they are available. The fastest way to get your results is to activate your My Chart account. Instructions are located on the last page of this paperwork. If you have not heard from [place] regarding the results in 2 weeks, please contact this office.    We recommend that you schedule a mammogram for breast cancer screening. Typically, you do not need a referral to do this. Please contact a local imaging center to schedule your mammogram.  [facility] - [phone]  *ask for the Radiology Department [facility] [place] ([place]) - [phone] or [phone]  [facility] - [phone] [place] [phone] [facility] - [phone]  *ask for the [facility] - [phone]  *ask for the Radiology Department [facility] - [phone]  *ask for the [place] - [phone] Gout Gout is painful swelling that can happen in some of your joints. Gout is a type of arthritis. This condition is caused by having too much uric acid in your body. Uric acid is a chemical that is made when your body breaks down substances called purines. If your body has too much uric acid, sharp crystals can form and build up in your joints. This causes pain and swelling. Gout attacks can happen quickly and be very painful (acute gout). Over time, the attacks can  affect more joints and happen more often (chronic gout). Follow these instructions at home: During a Gout Attack  If directed, put ice on the painful area: ? Put ice in a plastic bag. ? Place a towel between your skin and the bag. ? Leave the ice on for 20 minutes, 2-3 times a day.  Rest the joint as much as possible. If the joint is in your leg, you may be given crutches to use.  Raise (elevate) the painful joint above the level of your heart as often as you can.  Drink enough fluids to keep your pee (urine) clear or pale yellow.  Take over-the-counter and prescription medicines only as told by your doctor.  Do not drive or use heavy machinery while taking prescription pain medicine.  Follow instructions from your doctor about what you can or cannot eat and drink.  Return to your normal activities as told by your doctor. Ask your doctor what activities are safe for you. Avoiding Future Gout Attacks  Follow a low-purine diet as told by a specialist (dietitian) or your doctor. Avoid foods and drinks that have a lot of purines, such as: ? Liver. ? Kidney. ? Anchovies. ? Asparagus. ? Herring. ? Mushrooms ? Mussels. ? Beer.  Limit alcohol intake to no more than 1 drink a day for nonpregnant women and 2 drinks a day for men. One drink equals 12 oz of beer, 5 oz of wine, or 1 oz of hard liquor.  Stay at a healthy weight or lose weight if you are overweight. If you want to  lose weight, talk with your doctor. It is important that you do not lose weight too fast.  Start or continue an exercise plan as told by your doctor.  Drink enough fluids to keep your pee clear or pale yellow.  Take over-the-counter and prescription medicines only as told by your doctor.  Keep all follow-up visits as told by your doctor. This is important. Contact a doctor if:  You have another gout attack.  You still have symptoms of a gout attack after10 days of treatment.  You have problems (side  effects) because of your medicines.  You have chills or a fever.  You have burning pain when you pee (urinate).  You have pain in your lower back or belly. Get help right away if:  You have very bad pain.  Your pain cannot be controlled.  You cannot pee. This information is not intended to replace advice given to you by your health care provider. Make sure you discuss any questions you have with your health care provider. Document Released: 08/07/2008 Document Revised: 04/05/2016 Document Reviewed: 08/11/2015 Elsevier Interactive Patient Education  [employer].

## 2017-04-18 NOTE — Telephone Encounter (Signed)
Seeing sagardia today

## 2017-04-18 NOTE — Progress Notes (Signed)
Candice Candice [name] 56 y.o.   Chief Complaint  Patient presents with  . Gout    right foot x 4.5 weeks    HISTORY OF PRESENT ILLNESS: This is a 56 y.o. female complaining of gout attack to right foot.  HPI   Prior to Admission medications   Medication Sig Start Date End Date Taking? Authorizing Provider  atenolol (TENORMIN) 50 MG tablet TAKE 1 TABLET BY MOUTH EVERY DAY 02/15/17  Yes Wendie Agreste, MD  colchicine 0.6 MG tablet 1-2 tabs at onset of toe Candice [name]. Take once per flair only. 10/24/16  Yes Wendie Agreste, MD  cyclobenzaprine (FLEXERIL) 5 MG tablet TAKE 1/2 TO 1 TABLET BY MOUTH AT BEDTIME AS NEEDED 10/14/16  Yes Wendie Agreste, MD  Emollient (EUCERIN) lotion Apply topically as needed for dry skin. 04/04/14  Yes Roselee Culver, MD  levocetirizine (XYZAL) 5 MG tablet TAKE 1 TABLET (5 MG TOTAL) BY MOUTH EVERY EVENING. 01/09/17  Yes Tereasa Coop, PA-C  losartan (COZAAR) 50 MG tablet Take 1 tablet (50 mg total) by mouth daily. 10/31/16  Yes Wendie Agreste, MD  triamcinolone cream (KENALOG) 0.1 % Apply 1 application topically 2 (two) times daily as needed. 05/30/16  Yes Wendie Agreste, MD  predniSONE (DELTASONE) 20 MG tablet Take 2 tablets (40 mg total) by mouth daily with breakfast. Patient not taking: Reported on 04/18/2017 04/10/17   Wendie Agreste, MD    Allergies  Allergen Reactions  . Ace Inhibitors Nausea And Vomiting    Patient Active Problem List   Diagnosis Date Noted  . Cardiomegaly 11/06/2014  . HTN (hypertension) 08/19/2013  . Gout 08/19/2013  . Allergic urticaria 06/23/2013  . Contact dermatitis 07/16/2012    Past Medical History:  Diagnosis Date  . Allergy   . Heart murmur   . Hypertension     Past Surgical History:  Procedure Laterality Date  . OVARIAN CYST REMOVAL Left    20 yrs ago  . TUBAL LIGATION      Social History   Social History  . Marital status: Divorced    Spouse name: N/A  . Number of children: N/A  . Years of  education: N/A   Occupational History  . Not on file.   Social History Main Topics  . Smoking status: Never Smoker  . Smokeless tobacco: Never Used  . Alcohol use No  . Drug use: No  . Sexual activity: Yes    Birth control/ protection: Surgical   Other Topics Concern  . Not on file   Social History Narrative  . No narrative on file    Family History  Problem Relation Age of Onset  . Stroke Mother   . Heart disease Sister   . Aneurysm Sister      ROS Vitals:   04/18/17 1623 04/18/17 1645  BP: (!) 146/88 (!) 146/88  Pulse: (!) 55   Resp: 16   Temp: 98.4 F (36.9 C)      Physical Exam  Constitutional: She is oriented to person, place, and time. She appears well-developed and well-nourished.  HENT:  Head: Normocephalic and atraumatic.  Eyes: Pupils are equal, round, and reactive to light.  Neck: Normal range of motion.  Cardiovascular: Normal rate and regular rhythm.   Pulmonary/Chest: Effort normal and breath sounds normal.  Musculoskeletal:  Right foot: +swelling with erythema and tenderness 1st MTP joint.  Neurological: She is alert and oriented to person, place, and time. No sensory deficit. She exhibits  normal muscle tone.  Skin: Skin is warm and dry. Capillary refill takes less than 2 seconds. No rash noted.  Psychiatric: She has a normal mood and affect. Her behavior is normal.  Vitals reviewed.    ASSESSMENT & PLAN: Candice [name] was seen today for gout.  Diagnoses and all orders for this visit:  Acute gout involving toe of right foot, unspecified cause  Essential hypertension  Other orders -     predniSONE (DELTASONE) 20 MG tablet; Take 2 tablets (40 mg total) by mouth daily with breakfast. -     indomethacin (INDOCIN) 50 MG capsule; Take 1 capsule (50 mg total) by mouth 2 (two) times daily with a meal.    Patient Instructions       IF you received an x-ray today, you will receive an invoice from [facility] Radiology. Please contact  [facility] Radiology at [phone] with questions or concerns regarding your invoice.   IF you received labwork today, you will receive an invoice from [place]. Please contact [facility] at [phone] with questions or concerns regarding your invoice.   Our billing staff will not be able to assist you with questions regarding bills from these companies.  You will be contacted with the lab results as soon as they are available. The fastest way to get your results is to activate your My Chart account. Instructions are located on the last page of this paperwork. If you have not heard from [place] regarding the results in 2 weeks, please contact this office.    We recommend that you schedule a mammogram for breast cancer screening. Typically, you do not need a referral to do this. Please contact a local imaging center to schedule your mammogram.  [facility] - [phone]  *ask for the Radiology Department [facility] [place] ([place]) - [phone] or [phone]  [facility] - [phone] [place] [phone] [facility] - [phone]  *ask for the [facility] - [phone]  *ask for the Radiology Department [facility] - [phone]  *ask for the [place] - [phone] Gout Gout is painful swelling that can happen in some of your joints. Gout is a type of arthritis. This condition is caused by having too much uric acid in your body. Uric acid is a chemical that is made when your body breaks down substances called purines. If your body has too much uric acid, sharp crystals can form and build up in your joints. This causes Candice [name] and swelling. Gout attacks can happen quickly and be very painful (acute gout). Over time, the attacks can affect more joints and happen more often (chronic gout). Follow these instructions at home: During a Gout  Attack  If directed, put ice on the painful area: ? Put ice in a plastic bag. ? Place a towel between your skin and the bag. ? Leave the ice on for 20 minutes, 2-3 times a day.  Rest the joint as much as possible. If the joint is in your leg, you may be given crutches to use.  Raise (elevate) the painful joint above the level of your heart as often as you can.  Drink enough fluids to keep your pee (urine) clear or pale yellow.  Take over-the-counter and prescription medicines only as told by your doctor.  Do not drive or use heavy machinery while taking prescription Candice [name] medicine.  Follow instructions from your doctor about what you can  or cannot eat and drink.  Return to your normal activities as told by your doctor. Ask your doctor what activities are safe for you. Avoiding Future Gout Attacks  Follow a low-purine diet as told by a specialist (dietitian) or your doctor. Avoid foods and drinks that have a lot of purines, such as: ? Liver. ? Kidney. ? Anchovies. ? Asparagus. ? Herring. ? Mushrooms ? Mussels. ? Beer.  Limit alcohol intake to no more than 1 drink a day for nonpregnant women and 2 drinks a day for men. One drink equals 12 oz of beer, 5 oz of wine, or 1 oz of hard liquor.  Stay at a healthy weight or lose weight if you are overweight. If you want to lose weight, talk with your doctor. It is important that you do not lose weight too fast.  Start or continue an exercise plan as told by your doctor.  Drink enough fluids to keep your pee clear or pale yellow.  Take over-the-counter and prescription medicines only as told by your doctor.  Keep all follow-up visits as told by your doctor. This is important. Contact a doctor if:  You have another gout attack.  You still have symptoms of a gout attack after10 days of treatment.  You have problems (side effects) because of your medicines.  You have chills or a fever.  You have burning Candice [name] when you pee  (urinate).  You have Candice [name] in your lower back or belly. Get help right away if:  You have very bad Candice [name].  Your Candice [name] cannot be controlled.  You cannot pee. This information is not intended to replace advice given to you by your health care provider. Make sure you discuss any questions you have with your health care provider. Document Released: 08/07/2008 Document Revised: 04/05/2016 Document Reviewed: 08/11/2015 Elsevier Interactive Patient Education  2018 Elsevier Inc.      Agustina Caroli, MD [facility]

## 2017-04-25 ENCOUNTER — Ambulatory Visit (INDEPENDENT_AMBULATORY_CARE_PROVIDER_SITE_OTHER): Payer: BC Managed Care – PPO | Admitting: Emergency Medicine

## 2017-04-25 ENCOUNTER — Encounter: Payer: Self-pay | Admitting: Emergency Medicine

## 2017-04-25 VITALS — BP 138/98 | HR 98 | Temp 98.0°F | Resp 18 | Ht 64.96 in | Wt 218.0 lb

## 2017-04-25 DIAGNOSIS — M109 Gout, unspecified: Secondary | ICD-10-CM

## 2017-04-25 MED ORDER — INDOMETHACIN 50 MG PO CAPS: 50.0000 mg | ORAL_CAPSULE | Freq: Three times a day (TID) | ORAL | 1 refills | Status: AC

## 2017-04-25 NOTE — Progress Notes (Signed)
Candice [name] 56 y.o.   Chief Complaint  Patient presents with  . Gout    toe on the right foot, pt states she is still having [name].     HISTORY OF PRESENT ILLNESS: This is a 56 y.o. female complaining of [name] and swelling to right proximal big toe. Seen here 5/30 and 6/7 x same; gets better and then returns. Denies injury. Has h/o hyperuricemia and gout. Took 2 courses of Prednisone.  HPI   Prior to Admission medications   Medication Sig Start Date End Date Taking? Authorizing Provider  atenolol (TENORMIN) 50 MG tablet TAKE 1 TABLET BY MOUTH EVERY DAY 02/15/17  Yes Wendie Agreste, MD  colchicine 0.6 MG tablet 1-2 tabs at onset of toe [name]. Take once per flair only. 10/24/16  Yes Wendie Agreste, MD  cyclobenzaprine (FLEXERIL) 5 MG tablet TAKE 1/2 TO 1 TABLET BY MOUTH AT BEDTIME AS NEEDED 10/14/16  Yes Wendie Agreste, MD  Emollient (EUCERIN) lotion Apply topically as needed for dry skin. 04/04/14  Yes Roselee Culver, MD  levocetirizine (XYZAL) 5 MG tablet TAKE 1 TABLET (5 MG TOTAL) BY MOUTH EVERY EVENING. 01/09/17  Yes Tereasa Coop, PA-C  losartan (COZAAR) 50 MG tablet Take 1 tablet (50 mg total) by mouth daily. 10/31/16  Yes Wendie Agreste, MD  triamcinolone cream (KENALOG) 0.1 % Apply 1 application topically 2 (two) times daily as needed. 05/30/16  Yes Wendie Agreste, MD    Allergies  Allergen Reactions  . Ace Inhibitors Nausea And Vomiting    Patient Active Problem List   Diagnosis Date Noted  . Cardiomegaly 11/06/2014  . HTN (hypertension) 08/19/2013  . Gout 08/19/2013  . Allergic urticaria 06/23/2013  . Contact dermatitis 07/16/2012    Past Medical History:  Diagnosis Date  . Allergy   . Heart murmur   . Hypertension     Past Surgical History:  Procedure Laterality Date  . OVARIAN CYST REMOVAL Left    20 yrs ago  . TUBAL LIGATION      Social History   Social History  . Marital status: Divorced    Spouse name: N/A  . Number of  children: N/A  . Years of education: N/A   Occupational History  . Not on file.   Social History Main Topics  . Smoking status: Never Smoker  . Smokeless tobacco: Never Used  . Alcohol use No  . Drug use: No  . Sexual activity: Yes    Birth control/ protection: Surgical   Other Topics Concern  . Not on file   Social History Narrative  . No narrative on file    Family History  Problem Relation Age of Onset  . Stroke Mother   . Heart disease Sister   . Aneurysm Sister      Review of Systems  Constitutional: Negative for chills and fever.  Respiratory: Negative for shortness of breath.   Cardiovascular: Negative for chest [name] and palpitations.  Gastrointestinal: Negative for abdominal [name], diarrhea, nausea and vomiting.  Genitourinary: Negative for dysuria and hematuria.  Musculoskeletal: Positive for joint [name]. Negative for myalgias and neck [name].  Skin: Negative for rash.  Neurological: Negative for dizziness and headaches.  All other systems reviewed and are negative.  Vitals:   04/25/17 1600  BP: (!) 138/98  Pulse: 98  Resp: 18  Temp: 98 F (36.7 C)     Physical Exam  Constitutional: She is oriented to person, place, and time. She appears  well-developed and well-nourished.  HENT:  Head: Normocephalic and atraumatic.  Eyes: EOM are normal. Pupils are equal, round, and reactive to light.  Neck: Normal range of motion. Neck supple.  Cardiovascular: Normal rate.   Pulmonary/Chest: Effort normal.  Musculoskeletal:  Right 1st MTP joint: +erythema and swelling, tender to palpation.  Neurological: She is alert and oriented to person, place, and time.  Skin: Skin is warm and dry. Capillary refill takes less than 2 seconds.  Psychiatric: She has a normal mood and affect. Her behavior is normal.  Vitals reviewed.    ASSESSMENT & PLAN:  Beaux was seen today for gout.  Diagnoses and all orders for this visit:  Acute gout involving toe of right foot,  unspecified cause -     CBC with Differential/Platelet -     Comprehensive metabolic panel -     Uric Acid -     Ambulatory referral to Podiatry -     Ambulatory referral to Rheumatology  Other orders -     indomethacin (INDOCIN) 50 MG capsule; Take 1 capsule (50 mg total) by mouth 3 (three) times daily with meals.    Patient Instructions       IF you received an x-ray today, you will receive an invoice from [facility] Radiology. Please contact [facility] Radiology at [phone] with questions or concerns regarding your invoice.   IF you received labwork today, you will receive an invoice from [place]. Please contact [facility] at [phone] with questions or concerns regarding your invoice.   Our billing staff will not be able to assist you with questions regarding bills from these companies.  You will be contacted with the lab results as soon as they are available. The fastest way to get your results is to activate your My Chart account. Instructions are located on the last page of this paperwork. If you have not heard from [place] regarding the results in 2 weeks, please contact this office.      Gout Gout is painful swelling that can happen in some of your joints. Gout is a type of arthritis. This condition is caused by having too much uric acid in your body. Uric acid is a chemical that is made when your body breaks down substances called purines. If your body has too much uric acid, sharp crystals can form and build up in your joints. This causes [name] and swelling. Gout attacks can happen quickly and be very painful (acute gout). Over time, the attacks can affect more joints and happen more often (chronic gout). Follow these instructions at home: During a Gout Attack  If directed, put ice on the painful area: ? Put ice in a plastic bag. ? Place a towel between your skin and the bag. ? Leave the ice on for 20 minutes, 2-3 times a day.  Rest the joint as much as possible. If  the joint is in your leg, you may be given crutches to use.  Raise (elevate) the painful joint above the level of your heart as often as you can.  Drink enough fluids to keep your pee (urine) clear or pale yellow.  Take over-the-counter and prescription medicines only as told by your doctor.  Do not drive or use heavy machinery while taking prescription [name] medicine.  Follow instructions from your doctor about what you can or cannot eat and drink.  Return to your normal activities as told by your doctor. Ask your doctor what activities are safe for you. Avoiding Future Gout Attacks  Follow a low-purine diet as told by a specialist (dietitian) or your doctor. Avoid foods and drinks that have a lot of purines, such as: ? Liver. ? Kidney. ? Anchovies. ? Asparagus. ? Herring. ? Mushrooms ? Mussels. ? Beer.  Limit alcohol intake to no more than 1 drink a day for nonpregnant women and 2 drinks a day for men. One drink equals 12 oz of beer, 5 oz of wine, or 1 oz of hard liquor.  Stay at a healthy weight or lose weight if you are overweight. If you want to lose weight, talk with your doctor. It is important that you do not lose weight too fast.  Start or continue an exercise plan as told by your doctor.  Drink enough fluids to keep your pee clear or pale yellow.  Take over-the-counter and prescription medicines only as told by your doctor.  Keep all follow-up visits as told by your doctor. This is important. Contact a doctor if:  You have another gout attack.  You still have symptoms of a gout attack after10 days of treatment.  You have problems (side effects) because of your medicines.  You have chills or a fever.  You have burning [name] when you pee (urinate).  You have [name] in your lower back or belly. Get help right away if:  You have very bad [name].  Your [name] cannot be controlled.  You cannot pee. This information is not intended to replace advice given to you by  your health care provider. Make sure you discuss any questions you have with your health care provider. Document Released: 08/07/2008 Document Revised: 04/05/2016 Document Reviewed: 08/11/2015 Elsevier Interactive Patient Education  2018 Elsevier Inc.     Agustina Caroli, MD [facility]

## 2017-04-25 NOTE — Patient Instructions (Addendum)
   IF you received an x-ray today, you will receive an invoice from [facility]. Please contact [facility] at [phone] with questions or concerns regarding your invoice.   IF you received labwork today, you will receive an invoice from [facility]. Please contact [facility] at [phone] with questions or concerns regarding your invoice.   Our billing staff will not be able to assist you with questions regarding bills from these companies.  You will be contacted with the lab results as soon as they are available. The fastest way to get your results is to activate your My Chart account. Instructions are located on the last page of this paperwork. If you have not heard from us regarding the results in 2 weeks, please contact this office.      Gout Gout is painful swelling that can happen in some of your joints. Gout is a type of arthritis. This condition is caused by having too much uric acid in your body. Uric acid is a chemical that is made when your body breaks down substances called purines. If your body has too much uric acid, sharp crystals can form and build up in your joints. This causes pain and swelling. Gout attacks can happen quickly and be very painful (acute gout). Over time, the attacks can affect more joints and happen more often (chronic gout). Follow these instructions at home: During a Gout Attack  If directed, put ice on the painful area: ? Put ice in a plastic bag. ? Place a towel between your skin and the bag. ? Leave the ice on for 20 minutes, 2-3 times a day.  Rest the joint as much as possible. If the joint is in your leg, you may be given crutches to use.  Raise (elevate) the painful joint above the level of your heart as often as you can.  Drink enough fluids to keep your pee (urine) clear or pale yellow.  Take over-the-counter and prescription medicines only as told by your doctor.  Do not drive or use heavy machinery while taking  prescription pain medicine.  Follow instructions from your doctor about what you can or cannot eat and drink.  Return to your normal activities as told by your doctor. Ask your doctor what activities are safe for you. Avoiding Future Gout Attacks  Follow a low-purine diet as told by a specialist (dietitian) or your doctor. Avoid foods and drinks that have a lot of purines, such as: ? Liver. ? Kidney. ? Anchovies. ? Asparagus. ? Herring. ? Mushrooms ? Mussels. ? Beer.  Limit alcohol intake to no more than 1 drink a day for nonpregnant women and 2 drinks a day for men. One drink equals 12 oz of beer, 5 oz of wine, or 1 oz of hard liquor.  Stay at a healthy weight or lose weight if you are overweight. If you want to lose weight, talk with your doctor. It is important that you do not lose weight too fast.  Start or continue an exercise plan as told by your doctor.  Drink enough fluids to keep your pee clear or pale yellow.  Take over-the-counter and prescription medicines only as told by your doctor.  Keep all follow-up visits as told by your doctor. This is important. Contact a doctor if:  You have another gout attack.  You still have symptoms of a gout attack after10 days of treatment.  You have problems (side effects) because of your medicines.  You have chills or a fever.    You have burning pain when you pee (urinate).  You have pain in your lower back or belly. Get help right away if:  You have very bad pain.  Your pain cannot be controlled.  You cannot pee. This information is not intended to replace advice given to you by your health care provider. Make sure you discuss any questions you have with your health care provider. Document Released: 08/07/2008 Document Revised: 04/05/2016 Document Reviewed: 08/11/2015 Elsevier Interactive Patient Education  2018 Elsevier Inc.  

## 2017-04-26 LAB — COMPREHENSIVE METABOLIC PANEL
ALBUMIN: 3.8 g/dL (ref 3.5–5.5)
ALT: 14 IU/L (ref 0–32)
AST: 15 IU/L (ref 0–40)
Albumin/Globulin Ratio: 1 — ABNORMAL LOW (ref 1.2–2.2)
Alkaline Phosphatase: 101 IU/L (ref 39–117)
BUN / CREAT RATIO: 20 (ref 9–23)
BUN: 19 mg/dL (ref 6–24)
Bilirubin Total: 0.4 mg/dL (ref 0.0–1.2)
CO2: 20 mmol/L (ref 20–29)
CREATININE: 0.96 mg/dL (ref 0.57–1.00)
Calcium: 9 mg/dL (ref 8.7–10.2)
Chloride: 107 mmol/L — ABNORMAL HIGH (ref 96–106)
GFR calc Af Amer: 76 mL/min/{1.73_m2} (ref >59)
GFR, EST NON AFRICAN AMERICAN: 66 mL/min/{1.73_m2} (ref >59)
GLOBULIN, TOTAL: 3.7 g/dL (ref 1.5–4.5)
GLUCOSE: 86 mg/dL (ref 65–99)
Potassium: 3.7 mmol/L (ref 3.5–5.2)
SODIUM: 141 mmol/L (ref 134–144)
Total Protein: 7.5 g/dL (ref 6.0–8.5)

## 2017-04-26 LAB — CBC WITH DIFFERENTIAL/PLATELET
BASOS ABS: 0 10*3/uL (ref 0.0–0.2)
Basos: 1 %
EOS (ABSOLUTE): 0.6 10*3/uL — ABNORMAL HIGH (ref 0.0–0.4)
EOS: 9 %
HEMATOCRIT: 35 % (ref 34.0–46.6)
HEMOGLOBIN: 11.6 g/dL (ref 11.1–15.9)
IMMATURE GRANULOCYTES: 0 %
Immature Grans (Abs): 0 10*3/uL (ref 0.0–0.1)
LYMPHS: 20 %
Lymphocytes Absolute: 1.3 10*3/uL (ref 0.7–3.1)
MCH: 26.8 pg (ref 26.6–33.0)
MCHC: 33.1 g/dL (ref 31.5–35.7)
MCV: 81 fL (ref 79–97)
MONOCYTES: 5 %
Monocytes Absolute: 0.3 10*3/uL (ref 0.1–0.9)
Neutrophils Absolute: 4.1 10*3/uL (ref 1.4–7.0)
Neutrophils: 65 %
Platelets: 237 10*3/uL (ref 150–379)
RBC: 4.33 x10E6/uL (ref 3.77–5.28)
RDW: 16.4 % — ABNORMAL HIGH (ref 12.3–15.4)
WBC: 6.3 10*3/uL (ref 3.4–10.8)

## 2017-04-26 LAB — URIC ACID: URIC ACID: 5.9 mg/dL (ref 2.5–7.1)

## 2017-04-30 ENCOUNTER — Ambulatory Visit (INDEPENDENT_AMBULATORY_CARE_PROVIDER_SITE_OTHER): Payer: BC Managed Care – PPO | Admitting: Podiatry

## 2017-04-30 ENCOUNTER — Ambulatory Visit (INDEPENDENT_AMBULATORY_CARE_PROVIDER_SITE_OTHER): Payer: BC Managed Care – PPO

## 2017-04-30 ENCOUNTER — Encounter: Payer: Self-pay | Admitting: Podiatry

## 2017-04-30 ENCOUNTER — Telehealth: Payer: Self-pay | Admitting: Podiatry

## 2017-04-30 DIAGNOSIS — M79671 Pain in right foot: Secondary | ICD-10-CM

## 2017-04-30 DIAGNOSIS — M109 Gout, unspecified: Secondary | ICD-10-CM

## 2017-04-30 MED ORDER — ALLOPURINOL 100 MG PO TABS: 100.0000 mg | ORAL_TABLET | Freq: Two times a day (BID) | ORAL | 5 refills | Status: DC

## 2017-04-30 MED ORDER — COLCHICINE 0.6 MG PO TABS: ORAL_TABLET | ORAL | 3 refills | Status: DC

## 2017-04-30 NOTE — Telephone Encounter (Signed)
Hi, I was just in your office and we discussed while he sat in front of me that [facility] [address]. I received a phone call that the medication was sent to [facility]. I would [facility] [address]. I look forward to hearing from you and the correction being made. Thank you!

## 2017-04-30 NOTE — Telephone Encounter (Signed)
Bethann Humble called while I dialed pt's phone, Bethann Humble states pt is calling to see if the rxs have been switched to the proper pharmacy, and I told her I had just completed and was trying to contact pt. Bethann Humble states she will inform pt of the changes.

## 2017-04-30 NOTE — Progress Notes (Signed)
   Subjective:    Patient ID: Candice [name], female    DOB: 1961/07/10, 56 y.o.   MRN: [id number]  HPI: She presents today with a chief complaint of a ankle first metatarsophalangeal joint right foot. She states is very red and swollen for the past 7 weeks with severe [name]. She went to her primary doctor was placed on colchicine and prednisone which helped but wants this was completed it came back again. Blood work was done and her uric acid was only a 5.4. She returned to Dr. was prescribed indomethacin she states that it is better but now is still sore and she can't wear closed toe shoes. States that she had an episode like this before and was diagnosed with gout with elevated uric acid over a year ago.    Review of Systems  Musculoskeletal: Positive for arthralgias.  All other systems reviewed and are negative.      Objective:   Physical Exam: Vital signs are stable alert and oriented 3. Pulses are palpable. Neurologic sensorium is intact. Deep tendon reflexes are intact. Muscle strength is 5 over 5 dorsiflexion plantar flexors and inverters everters all intrinsic musculature is intact. Orthopedic evaluation demonstrates mild hallux abductovalgus deformity right and should a lesser degree to the left foot. There is overlying reactive hyperpigmentation. Radiographs taken today demonstrate a mild increase in the first intermetatarsal angle mild hallux abductus angle soft tissue swelling along the medial aspect of the first metatarsophalangeal joint early spur to the head of the first metatarsal dorsally.        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  Plan: Refilled her colchicine and started her on allopurinol 100 mg twice daily. 4 follow up with her in a few weeks to make sure that she has not had any more flares.

## 2017-05-12 ENCOUNTER — Other Ambulatory Visit: Payer: Self-pay | Admitting: Family Medicine

## 2017-05-13 ENCOUNTER — Telehealth: Payer: Self-pay

## 2017-05-13 ENCOUNTER — Telehealth: Payer: Self-pay | Admitting: Podiatry

## 2017-05-13 NOTE — Telephone Encounter (Signed)
Patient experiencing reactions to medication, states she has had chills, headache nausea and vomiting and attributes it to the Zyloprim. Advised her to d/c medication, and if symptoms did not improve she is to see her PCP.

## 2017-05-13 NOTE — Telephone Encounter (Signed)
Hello, I saw Dr. Milinda Pointer on 19 June. He prescribed Zyloprim 100 MG tablet and I am having the most severe reaction to it. Please call me back at [phone]. Thank you.

## 2017-05-14 ENCOUNTER — Telehealth: Payer: Self-pay

## 2017-05-14 NOTE — Telephone Encounter (Signed)
Could switch her to uloric.  That type of reaction should not be from the medication.

## 2017-05-14 NOTE — Telephone Encounter (Signed)
Patient states that her symptoms have improved as far as the chills, nausea and vomiting. She states that her foot pain and swelling have improved as well. Advised her to call with any symptom changes

## 2017-06-11 ENCOUNTER — Ambulatory Visit (INDEPENDENT_AMBULATORY_CARE_PROVIDER_SITE_OTHER): Payer: BC Managed Care – PPO | Admitting: Podiatry

## 2017-06-11 ENCOUNTER — Encounter: Payer: Self-pay | Admitting: Podiatry

## 2017-06-11 ENCOUNTER — Other Ambulatory Visit: Payer: Self-pay | Admitting: Family Medicine

## 2017-06-11 DIAGNOSIS — M109 Gout, unspecified: Secondary | ICD-10-CM | POA: Diagnosis not present

## 2017-06-11 MED ORDER — ALLOPURINOL 100 MG PO TABS: 100.0000 mg | ORAL_TABLET | Freq: Every day | ORAL | 6 refills | Status: DC

## 2017-06-12 NOTE — Progress Notes (Signed)
She presents today for follow-up of gout to the first metatarsophalangeal joint of the right foot. She states the foot feels much better but still have a bump.  Objective: Vital signs are stable she's alert and oriented 3. Pulses are palpable. At this point I evaluated the first metatarsophalangeal joint there is small nodule there possibly a gouty tophus. I explained to her that this may go down some but it may not go all the way back to normal she understands and is amenable to it.  Assessment: Gouty tophus right first metatarsophalangeal joint.  Plan: Discussed the use of colchicine and allopurinol. I recommended that she start back on the allopurinol 100 mg a day. I also recommended she discontinue the use of the colchicine until necessary. We discussed this in great detail today she understands my thoughts behind this and I will follow-up with her as needed.  Should this patient called requesting to be seen urgently should the very least needs to have blood work performed for uric acid. Also she will need to see Dr. within a day or 2.

## 2017-06-19 ENCOUNTER — Other Ambulatory Visit: Payer: Self-pay | Admitting: Family Medicine

## 2017-06-21 ENCOUNTER — Ambulatory Visit (INDEPENDENT_AMBULATORY_CARE_PROVIDER_SITE_OTHER): Payer: BC Managed Care – PPO | Admitting: Family Medicine

## 2017-06-21 ENCOUNTER — Ambulatory Visit: Payer: BC Managed Care – PPO | Admitting: Family Medicine

## 2017-06-21 ENCOUNTER — Encounter: Payer: Self-pay | Admitting: Family Medicine

## 2017-06-21 VITALS — BP 122/82 | HR 68 | Temp 98.6°F | Resp 16 | Ht 65.0 in | Wt 216.0 lb

## 2017-06-21 DIAGNOSIS — I10 Essential (primary) hypertension: Secondary | ICD-10-CM | POA: Diagnosis not present

## 2017-06-21 DIAGNOSIS — M109 Gout, unspecified: Secondary | ICD-10-CM

## 2017-06-21 LAB — BASIC METABOLIC PANEL
BUN / CREAT RATIO: 11 (ref 9–23)
BUN: 10 mg/dL (ref 6–24)
CHLORIDE: 105 mmol/L (ref 96–106)
CO2: 23 mmol/L (ref 20–29)
Calcium: 9.3 mg/dL (ref 8.7–10.2)
Creatinine, Ser: 0.87 mg/dL (ref 0.57–1.00)
GFR, EST AFRICAN AMERICAN: 86 mL/min/{1.73_m2} (ref >59)
GFR, EST NON AFRICAN AMERICAN: 74 mL/min/{1.73_m2} (ref >59)
Glucose: 79 mg/dL (ref 65–99)
Potassium: 4.4 mmol/L (ref 3.5–5.2)
Sodium: 142 mmol/L (ref 134–144)

## 2017-06-21 NOTE — Patient Instructions (Addendum)
  Keep a record of your blood pressures outside of the office and bring them to the next office visit. If numbers over 140/90, then return to discuss med changes.   For gout, can try just colchicine for now, but if you have frequent flairs, you may need to meet with rheumatology as allopurinol is not tolerated. I will check uric acid.   IF you received an x-ray today, you will receive an invoice from [facility]. Please contact [facility] at [phone] with questions or concerns regarding your invoice.   IF you received labwork today, you will receive an invoice from [facility]. Please contact [facility] at [phone] with questions or concerns regarding your invoice.   Our billing staff will not be able to assist you with questions regarding bills from these companies.  You will be contacted with the lab results as soon as they are available. The fastest way to get your results is to activate your My Chart account. Instructions are located on the last page of this paperwork. If you have not heard from [place] regarding the results in 2 weeks, please contact this office.

## 2017-06-21 NOTE — Progress Notes (Addendum)
Subjective:    Patient ID: Candice [name], female    DOB: 1961/09/04, 56 y.o.   MRN: [id number]  HPI Candice [name] is a 56 y.o. female Here for medication refill for hypertension. She has a history of hypertension, obesity, gout, vitamin D deficiency.  Hypertension Currently on atenolol 50 mg daily, and losartan 50 mg qd (had increased from 1/2 pill at prior visit). No home readings. No new side effects on this regimen.  Lab Results  Component Value Date   CREATININE 0.96 04/25/2017  had BMP ordered this am.    Gout Uric acid controlled at 5.9 on June 14, but was seen at that time for acute gout involving her right foot. Treated with indomethacin 50 mg 3 times a day as needed for acute [name] at that time. Had subsequent follow-up with podiatry, Dr. Milinda Pointer and has tried allopurinol - twice per day was not tolerated. Lots of side effects with twice a day allopurinol: headache, sore throat, chills, possible fever, no rash., feels better after stopping for a few days. Most recent visit with Dr. Milinda Pointer on July 31.   Plan to restart allopurinol 100 mg daily, then colchicine only as needed for breakthrough symptoms.  Had nausea, and itching with allopurinol taking once per day, so stopped it.  Current regimen is once per day colchicine.  No recent flair of gout on just colchicine.      Patient Active Problem List   Diagnosis Date Noted  . Skin lesion of left ear 03/16/2016  . Skin lesion of right ear 03/16/2016  . Cardiomegaly 11/06/2014  . Hypertension 08/19/2013  . Gout 08/19/2013  . Allergic urticaria 06/23/2013  . Contact dermatitis 07/16/2012  . Vitamin D deficiency 11/12/2011  . Anemia 10/08/2011  . Hypokalemia 10/03/2011  . Obesities, morbid ([place]) 10/03/2011   Past Medical History:  Diagnosis Date  . Allergy   . Heart murmur   . Hypertension    Past Surgical History:  Procedure Laterality Date  . OVARIAN CYST REMOVAL Left    20 yrs ago  . TUBAL LIGATION     Allergies    Allergen Reactions  . Ace Inhibitors Nausea And Vomiting   Prior to Admission medications   Medication Sig Start Date End Date Taking? Authorizing Provider  atenolol (TENORMIN) 50 MG tablet TAKE 1 TABLET BY MOUTH EVERY DAY 05/14/17  Yes Wendie Agreste, MD  atenolol-chlorthalidone (TENORETIC) 50-25 MG tablet TAKE 1 TABLET BY MOUTH DAILY. 01/10/16  Yes [provider]  colchicine 0.6 MG tablet 1 tablet TID until GI upset or gout flare subsides, then for maintenance take 1 tablet daily. 04/30/17  Yes Hyatt, Max T, DPM  cyclobenzaprine (FLEXERIL) 5 MG tablet TAKE 1/2 TO 1 TABLET BY MOUTH AT BEDTIME AS NEEDED 10/14/16  Yes Wendie Agreste, MD  Emollient (EUCERIN) lotion Apply topically as needed for dry skin. 04/04/14  Yes Roselee Culver, MD  levocetirizine (XYZAL) 5 MG tablet TAKE 1 TABLET (5 MG TOTAL) BY MOUTH EVERY EVENING. 01/09/17  Yes Tereasa Coop, PA-C  losartan (COZAAR) 50 MG tablet Take 1 tablet (50 mg total) by mouth daily. 10/31/16  Yes Wendie Agreste, MD  triamcinolone cream (KENALOG) 0.1 % Apply 1 application topically 2 (two) times daily as needed. 05/30/16  Yes Wendie Agreste, MD  allopurinol (ZYLOPRIM) 100 MG tablet Take 1 tablet (100 mg total) by mouth 2 (two) times daily. Patient not taking: Reported on 06/21/2017 04/30/17   Garrel Ridgel, DPM  allopurinol (ZYLOPRIM) 100 MG tablet Take 1 tablet (100 mg total) by mouth daily. Patient not taking: Reported on 06/21/2017 06/11/17   Garrel Ridgel, DPM   Social History   Social History  . Marital status: Divorced    Spouse name: N/A  . Number of children: N/A  . Years of education: N/A   Occupational History  . Not on file.   Social History Main Topics  . Smoking status: Never Smoker  . Smokeless tobacco: Never Used  . Alcohol use No  . Drug use: No  . Sexual activity: Yes    Birth control/ protection: Surgical   Other Topics Concern  . Not on file   Social History Narrative  . No narrative on file     Review of Systems  Constitutional: Negative for fatigue and unexpected weight change.  Respiratory: Negative for chest tightness and shortness of breath.   Cardiovascular: Negative for chest [name], palpitations and leg swelling.  Gastrointestinal: Negative for abdominal [name] and blood in stool.  Neurological: Negative for dizziness, syncope, light-headedness and headaches.       Objective:   Physical Exam  Constitutional: She is oriented to person, place, and time. She appears well-developed and well-nourished.  HENT:  Head: Normocephalic and atraumatic.  Eyes: Pupils are equal, round, and reactive to light. Conjunctivae and EOM are normal.  Neck: Carotid bruit is not present.  Cardiovascular: Normal rate, regular rhythm, normal heart sounds and intact distal pulses.   Pulmonary/Chest: Effort normal and breath sounds normal.  Abdominal: Soft. She exhibits no pulsatile midline mass. There is no tenderness.  Musculoskeletal:       Feet:  Neurological: She is alert and oriented to person, place, and time.  Skin: Skin is warm and dry.  Psychiatric: She has a normal mood and affect. Her behavior is normal.  Vitals reviewed.  Vitals:   06/21/17 1421 06/21/17 1450  BP: (!) 144/90 122/82  Pulse: 68   Resp: 16   Temp: 98.6 F (37 C)   TempSrc: Oral   SpO2: 99%   Weight: 216 lb (98 kg)   Height: 5\' 5"  (1.651 m)        Assessment & Plan:  Candice [name] is a 56 y.o. female Essential hypertension  - Stable on current regimen. Continue atenolol, losartan same doses. Had lab work drawn earlier in the day for BMP.  Gout involving toe of right foot, unspecified cause, unspecified chronicity - Plan: Uric Acid  -Intolerant to allopurinol with above side effects. We'll continue colchicine once per day for now, then increase with any acute flares. Previous uric acid level during flare, will check one now as may be more reliable indicator of control. If recurrent flares, may need to  look at other medication classes or rheumatology eval.   No orders of the defined types were placed in this encounter.  Patient Instructions    Keep a record of your blood pressures outside of the office and bring them to the next office visit. If numbers over 140/90, then return to discuss med changes.   For gout, can try just colchicine for now, but if you have frequent flairs, you may need to meet with rheumatology as allopurinol is not tolerated. I will check uric acid.   IF you received an x-ray today, you will receive an invoice from [facility]. Please contact [facility] at [phone] with questions or concerns regarding your invoice.   IF you received labwork today, you will receive an  invoice from [facility]. Please contact [facility] at [phone] with questions or concerns regarding your invoice.   Our billing staff will not be able to assist you with questions regarding bills from these companies.  You will be contacted with the lab results as soon as they are available. The fastest way to get your results is to activate your My Chart account. Instructions are located on the last page of this paperwork. If you have not heard from [place] regarding the results in 2 weeks, please contact this office.      Signed,   [name], MD [facility].  06/23/17 2:24 PM

## 2017-06-24 ENCOUNTER — Telehealth: Payer: Self-pay | Admitting: Family Medicine

## 2017-06-24 ENCOUNTER — Other Ambulatory Visit: Payer: Self-pay | Admitting: Family Medicine

## 2017-06-24 ENCOUNTER — Other Ambulatory Visit: Payer: Self-pay

## 2017-06-24 DIAGNOSIS — I10 Essential (primary) hypertension: Secondary | ICD-10-CM

## 2017-06-24 MED ORDER — ATENOLOL 50 MG PO TABS: 50.0000 mg | ORAL_TABLET | Freq: Every day | ORAL | 1 refills | Status: DC

## 2017-06-24 MED ORDER — LOSARTAN POTASSIUM 50 MG PO TABS: 50.0000 mg | ORAL_TABLET | Freq: Every day | ORAL | 1 refills | Status: DC

## 2017-06-24 NOTE — Addendum Note (Signed)
Addended by: [name] R on: 06/24/2017 01:33 PM   Modules accepted: Orders

## 2017-06-24 NOTE — Telephone Encounter (Signed)
PATIENT STATES SHE CAME INTO THE OFFICE ON Friday (06/21/17) TO SEE DR. GREENE FOR HER BLOOD PRESSURE. HE SAID HE WAS GOING TO REFILL HER ATENOLOL (TENORMIN) 50 MG AND LOSARTAN (COZAAR) 50 MG. WHEN SHE GOT TO THE PHARMACY THEY HAD NOT BEEN CALLED IN. SHE DOES NOT HAVE ANY TO TAKE FOR TODAY. BEST PHONE [phone] (CELL) [facility] [address] [place]

## 2017-06-24 NOTE — Telephone Encounter (Signed)
Pt came in stating that her meds were not at her pharmacy she was seen on 8/10 and talked to [facility] and he stated that pt meds were pending but will get sent out as soon as Dr Carlota Raspberry is notified

## 2017-06-27 NOTE — Telephone Encounter (Signed)
Meds were refilled on 06/24/2017

## 2017-08-05 ENCOUNTER — Other Ambulatory Visit: Payer: Self-pay | Admitting: Family Medicine

## 2017-08-05 DIAGNOSIS — Z1239 Encounter for other screening for malignant neoplasm of breast: Secondary | ICD-10-CM

## 2017-08-08 ENCOUNTER — Ambulatory Visit
Admission: RE | Admit: 2017-08-08 | Discharge: 2017-08-08 | Disposition: A | Discharge status: Home / Self Care | Payer: BC Managed Care – PPO | Source: Ambulatory Visit | Attending: Family Medicine | Admitting: Family Medicine

## 2017-08-08 DIAGNOSIS — Z1239 Encounter for other screening for malignant neoplasm of breast: Secondary | ICD-10-CM

## 2017-08-12 ENCOUNTER — Ambulatory Visit (INDEPENDENT_AMBULATORY_CARE_PROVIDER_SITE_OTHER): Payer: BC Managed Care – PPO | Admitting: Family Medicine

## 2017-08-12 ENCOUNTER — Encounter: Payer: Self-pay | Admitting: Family Medicine

## 2017-08-12 ENCOUNTER — Other Ambulatory Visit: Payer: Self-pay | Admitting: Family Medicine

## 2017-08-12 VITALS — BP 128/84 | HR 56 | Resp 16 | Ht 65.0 in | Wt 213.6 lb

## 2017-08-12 DIAGNOSIS — I10 Essential (primary) hypertension: Secondary | ICD-10-CM

## 2017-08-12 DIAGNOSIS — M79671 Pain in right foot: Secondary | ICD-10-CM | POA: Diagnosis not present

## 2017-08-12 DIAGNOSIS — M109 Gout, unspecified: Secondary | ICD-10-CM

## 2017-08-12 DIAGNOSIS — Z13220 Encounter for screening for lipoid disorders: Secondary | ICD-10-CM | POA: Diagnosis not present

## 2017-08-12 DIAGNOSIS — Z0000 Encounter for general adult medical examination without abnormal findings: Secondary | ICD-10-CM

## 2017-08-12 DIAGNOSIS — Z23 Encounter for immunization: Secondary | ICD-10-CM | POA: Diagnosis not present

## 2017-08-12 DIAGNOSIS — R9431 Abnormal electrocardiogram [ECG] [EKG]: Secondary | ICD-10-CM | POA: Diagnosis not present

## 2017-08-12 DIAGNOSIS — Z8249 Family history of ischemic heart disease and other diseases of the circulatory system: Secondary | ICD-10-CM

## 2017-08-12 DIAGNOSIS — Z1211 Encounter for screening for malignant neoplasm of colon: Secondary | ICD-10-CM | POA: Diagnosis not present

## 2017-08-12 DIAGNOSIS — Z1239 Encounter for other screening for malignant neoplasm of breast: Secondary | ICD-10-CM

## 2017-08-12 MED ORDER — ATENOLOL 25 MG PO TABS: 25.0000 mg | ORAL_TABLET | Freq: Every day | ORAL | 1 refills | Status: DC

## 2017-08-12 MED ORDER — COLCHICINE 0.6 MG PO TABS: 0.6000 mg | ORAL_TABLET | Freq: Every day | ORAL | 1 refills | Status: DC

## 2017-08-12 NOTE — Patient Instructions (Addendum)
Schedule appointment with eye care provider and dentist.  I will refer you for colonoscopy, but if you are able to obtain previous procedure information, that would help.  Keep up the great work with exercise and weight loss! Follow up in next 5-6 months for blood pressure.   Decrease atenolol to 25mg  per day due to low heart rate. Keep a record of your blood pressures outside of the office and if running over 140/90, may need to increase losartan.   EKG overall appears similar to previous EKG, but there are some possible irregularities. With your family history, I would like you to meet with a cardiologist to discuss stress testing or other evaluation as needed. Let me know if you have questions in the meantime.  Keeping You Healthy  Get These Tests  Blood Pressure- Have your blood pressure checked by your healthcare provider at least once a year.  Normal blood pressure is 120/80.  Weight- Have your body mass index (BMI) calculated to screen for obesity.  BMI is a measure of body fat based on height and weight.  You can calculate your own BMI at [employer].it  Cholesterol- Have your cholesterol checked every year.  Diabetes- Have your blood sugar checked every year if you have high blood pressure, high cholesterol, a family history of diabetes or if you are overweight.  Pap Test - Have a pap test every 1 to 5 years if you have been sexually active.  If you are older than 65 and recent pap tests have been normal you may not need additional pap tests.  In addition, if you have had a hysterectomy  for benign disease additional pap tests are not necessary.  Mammogram-Yearly mammograms are essential for early detection of breast cancer  Screening for Colon Cancer- Colonoscopy starting at age 30. Screening may begin sooner depending on your family history and other health conditions.  Follow up colonoscopy as directed by your Gastroenterologist.  Screening for Osteoporosis-  Screening begins at age 19 with bone density scanning, sooner if you are at higher risk for developing Osteoporosis.  Get these medicines  Calcium with Vitamin D- Your body requires 1200-1500 mg of Calcium a day and [phone] IU of Vitamin D a day.  You can only absorb 500 mg of Calcium at a time therefore Calcium must be taken in 2 or 3 separate doses throughout the day.  Hormones- Hormone therapy has been associated with increased risk for certain cancers and heart disease.  Talk to your healthcare provider about if you need relief from menopausal symptoms.  Aspirin- Ask your healthcare provider about taking Aspirin to prevent Heart Disease and Stroke.  Get these Immuniztions  Flu shot- Every fall  Pneumonia shot- Once after the age of 68; if you are younger ask your healthcare provider if you need a pneumonia shot.  Tetanus- Every ten years.  Zostavax- Once after the age of 10 to prevent shingles.  Take these steps  Don't smoke- Your healthcare provider can help you quit. For tips on how to quit, ask your healthcare provider or go to www.smokefree.gov or call 1-800 QUIT-NOW.  Be physically active- Exercise 5 days a week for a minimum of 30 minutes.  If you are not already physically active, start slow and gradually work up to 30 minutes of moderate physical activity.  Try walking, dancing, bike riding, swimming, etc.  Eat a healthy diet- Eat a variety of healthy foods such as fruits, vegetables, whole grains, low fat milk, low fat cheeses,  yogurt, lean meats, chicken, fish, eggs, dried beans, tofu, etc.  For more information go to www.thenutritionsource.org  Dental visit- Brush and floss teeth twice daily; visit your dentist twice a year.  Eye exam- Visit your Optometrist or Ophthalmologist yearly.  Drink alcohol in moderation- Limit alcohol intake to one drink or less a day.  Never drink and drive.  Depression- Your emotional health is as important as your physical health.  If  you're feeling down or losing interest in things you normally enjoy, please talk to your healthcare provider.  Seat Belts- can save your life; always wear one  Smoke/Carbon Monoxide detectors- These detectors need to be installed on the appropriate level of your home.  Replace batteries at least once a year.  Violence- If anyone is threatening or hurting you, please tell your healthcare provider.  Living Will/ Health care power of attorney- Discuss with your healthcare provider and family.   IF you received an x-ray today, you will receive an invoice from [facility]. Please contact [facility] at [phone] with questions or concerns regarding your invoice.   IF you received labwork today, you will receive an invoice from [facility]. Please contact [facility] at [phone] with questions or concerns regarding your invoice.   Our billing staff will not be able to assist you with questions regarding bills from these companies.  You will be contacted with the lab results as soon as they are available. The fastest way to get your results is to activate your My Chart account. Instructions are located on the last page of this paperwork. If you have not heard from [place] regarding the results in 2 weeks, please contact this office.

## 2017-08-12 NOTE — Progress Notes (Signed)
Subjective:  By signing my name below, I, Essence Howell, attest that this documentation has been prepared under the direction and in the presence of Wendie Agreste, MD Electronically Signed: Ladene Artist, ED Scribe 08/12/2017 at 8:36 AM.   Patient ID: Candice [name], female    DOB: 11/01/1961, 56 y.o.   MRN: [id number]  Chief Complaint  Patient presents with  . Annual Exam    labs for Uric Acid test   HPI Candice [name] is a 56 y.o. female who presents to Primary Care at [facility] for an annual exam. H/o HTN and gout. Pt is fasting at this visit.   HTN Takes Atenolol 50 mg qd and Losartan 50 mg qd. Last EKG was in 2013. Maternal grandmother and oldest sister passed from MI in her late 21s. Mother currently has chronic heart failure that was diagnosed in her 86s. Pt has not had a recent stress test within the past 5-10 years. Denies chest [name] or sob.  Lab Results  Component Value Date   CREATININE 0.87 06/21/2017   Gout Most recently seen by a podiatrist in July. First MTP on the R. He did recommend restarting allopurinol 100 mg qd. She did not tolerate all once per day either so stopped the med. Did tolerate colchicine once per day and uric acid today. Denies recent gout flares.  CA Screening Colonoscopy: reported that to be 8-10 years ago in [facility]. 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  Breast CA Screening: last mammogram 9/27; report pending  Cervical CA Screening: pap test 02/2016; negative, normal   Immunizations Immunization History  Administered Date(s) Administered  . Influenza-Unspecified 10/03/2011, 09/15/2016  . Tdap 02/23/2016   Depression Screening Depression screen [facility] 2/9 08/12/2017 06/21/2017 04/25/2017 04/18/2017 04/10/2017  Decreased Interest 0 0 0 0 0  Down, Depressed, Hopeless 0 0 0 0 0  PHQ - 2 Score 0 0 0 0 0     Visual Acuity Screening   Right eye Left eye Both eyes  Without correction: 20/30 20/25 20/25   With  correction:      Vision: wears glasses but does not see eye doctor regularly  Dentist: does not follow a dentist regularly  Exercise: Pt retired 9/1 and has been exercising daily for the past 2 weeks.   Wt Readings from Last 3 Encounters:  08/12/17 213 lb 9.6 oz (96.9 kg)  06/21/17 216 lb (98 kg)  04/25/17 218 lb (98.9 kg)   Patient Active Problem List   Diagnosis Date Noted  . Skin lesion of left ear 03/16/2016  . Skin lesion of right ear 03/16/2016  . Cardiomegaly 11/06/2014  . Hypertension 08/19/2013  . Gout 08/19/2013  . Allergic urticaria 06/23/2013  . Contact dermatitis 07/16/2012  . Vitamin D deficiency 11/12/2011  . Anemia 10/08/2011  . Hypokalemia 10/03/2011  . Obesities, morbid ([place]) 10/03/2011   Past Medical History:  Diagnosis Date  . Allergy   . Heart murmur   . Hypertension    Past Surgical History:  Procedure Laterality Date  . OVARIAN CYST REMOVAL Left    20 yrs ago  . TUBAL LIGATION     Allergies  Allergen Reactions  . Ace Inhibitors Nausea And Vomiting  . Allopurinol Nausea Only    Chills, headache, sore throat, sweats. No rash.    Prior to Admission medications   Medication Sig Start Date End Date Taking? Authorizing Provider  atenolol (TENORMIN) 50 MG tablet Take 1 tablet (50 mg total)  by mouth daily. 06/24/17   Wendie Agreste, MD  colchicine 0.6 MG tablet 1 tablet TID until GI upset or gout flare subsides, then for maintenance take 1 tablet daily. 04/30/17   Hyatt, Max T, DPM  cyclobenzaprine (FLEXERIL) 5 MG tablet TAKE 1/2 TO 1 TABLET BY MOUTH AT BEDTIME AS NEEDED 10/14/16   Wendie Agreste, MD  Emollient (EUCERIN) lotion Apply topically as needed for dry skin. 04/04/14   Roselee Culver, MD  levocetirizine (XYZAL) 5 MG tablet TAKE 1 TABLET (5 MG TOTAL) BY MOUTH EVERY EVENING. 01/09/17   Tereasa Coop, PA-C  losartan (COZAAR) 50 MG tablet Take 1 tablet (50 mg total) by mouth daily. 06/24/17   Wendie Agreste, MD  triamcinolone cream  (KENALOG) 0.1 % Apply 1 application topically 2 (two) times daily as needed. 05/30/16   Wendie Agreste, MD   Social History   Social History  . Marital status: Divorced    Spouse name: N/A  . Number of children: N/A  . Years of education: N/A   Occupational History  . Not on file.   Social History Main Topics  . Smoking status: Never Smoker  . Smokeless tobacco: Never Used  . Alcohol use No  . Drug use: No  . Sexual activity: Yes    Birth control/ protection: Surgical   Other Topics Concern  . Not on file   Social History Narrative  . No narrative on file   Review of Systems  Respiratory: Negative for shortness of breath.   Cardiovascular: Negative for chest [name].  Endocrine: Positive for heat intolerance.      Objective:   Physical Exam  Constitutional: She is oriented to person, place, and time. She appears well-developed and well-nourished.  HENT:  Head: Normocephalic and atraumatic.  Right Ear: External ear normal.  Left Ear: External ear normal.  Mouth/Throat: Oropharynx is clear and moist.  Eyes: Pupils are equal, round, and reactive to light. Conjunctivae are normal.  Neck: Normal range of motion. Neck supple. No thyromegaly present.  Cardiovascular: Normal rate, regular rhythm, normal heart sounds and intact distal pulses.   No murmur heard. Pulmonary/Chest: Effort normal and breath sounds normal. No respiratory distress. She has no wheezes.  Abdominal: Soft. Bowel sounds are normal. There is no tenderness.  Musculoskeletal: Normal range of motion. She exhibits no edema or tenderness.  Lymphadenopathy:    She has no cervical adenopathy.  Neurological: She is alert and oriented to person, place, and time.  Skin: Skin is warm and dry. No rash noted.  Psychiatric: She has a normal mood and affect. Her behavior is normal. Thought content normal.  Vitals reviewed.    Vitals:   08/12/17 0822  BP: 128/84  Pulse: (!) 56  Resp: 16  SpO2: 99%  Weight: 213  lb 9.6 oz (96.9 kg)  Height: 5\' 5"  (1.651 m)   EKG reading done by Wendie Agreste, MD: sinus rhythm marked bradycardia with rate 48. Nonspecific T wave in lead 2-3 AV with inverted T wave in AVF. Appears similar to prior EKG 10/2014. Lead V1-V4 nonspecific ST appears similar to 2015.     Assessment & Plan:   Candice [name] is a 56 y.o. female Annual physical exam  - -anticipatory guidance as below in AVS, screening labs above. Health maintenance items as above in HPI discussed/recommended as applicable.   Essential hypertension - Plan: EKG 12-Lead Nonspecific abnormal electrocardiogram (ECG) (EKG) - Plan: Ambulatory referral to Cardiology Family history of  heart disease - Plan: EKG 12-Lead, Ambulatory referral to Cardiology  - BP stable, no change in meds. Commended on weight loss, diet, exercise.  - Borderline abnormal EKG with family history of early cardiac disease, will refer to cardiology for further evaluation and to determine if other testing needed. Reports remote stress testing that was normal. Asymptomatic.  Gouty arthritis of right great toe - Plan: Uric Acid Foot [name], right - Plan: colchicine 0.6 MG tablet  - stable, check uric acid, continue colchicine once per day. Did not tolerate allopurinol.  Screening for hyperlipidemia - Plan: Lipid panel, Hepatic Function Panel  Screen for colon cancer - Plan: Ambulatory referral to Gastroenterology  Flu vaccine need - Plan: Flu Vaccine QUAD 36+ mos IM    Meds ordered this encounter  Medications  . atenolol (TENORMIN) 25 MG tablet    Sig: Take 1 tablet (25 mg total) by mouth daily.    Dispense:  90 tablet    Refill:  1  . colchicine 0.6 MG tablet    Sig: Take 1 tablet (0.6 mg total) by mouth daily.    Dispense:  90 tablet    Refill:  1   Patient Instructions   Schedule appointment with eye care provider and dentist.  I will refer you for colonoscopy, but if you are able to obtain previous procedure information, that  would help.  Keep up the great work with exercise and weight loss! Follow up in next 5-6 months for blood pressure.   Decrease atenolol to 25mg  per day due to low heart rate. Keep a record of your blood pressures outside of the office and if running over 140/90, may need to increase losartan.   EKG overall appears similar to previous EKG, but there are some possible irregularities. With your family history, I would like you to meet with a cardiologist to discuss stress testing or other evaluation as needed. Let me know if you have questions in the meantime.  Keeping You Healthy  Get These Tests  Blood Pressure- Have your blood pressure checked by your healthcare provider at least once a year.  Normal blood pressure is 120/80.  Weight- Have your body mass index (BMI) calculated to screen for obesity.  BMI is a measure of body fat based on height and weight.  You can calculate your own BMI at [employer].it  Cholesterol- Have your cholesterol checked every year.  Diabetes- Have your blood sugar checked every year if you have high blood pressure, high cholesterol, a family history of diabetes or if you are overweight.  Pap Test - Have a pap test every 1 to 5 years if you have been sexually active.  If you are older than 65 and recent pap tests have been normal you may not need additional pap tests.  In addition, if you have had a hysterectomy  for benign disease additional pap tests are not necessary.  Mammogram-Yearly mammograms are essential for early detection of breast cancer  Screening for Colon Cancer- Colonoscopy starting at age 61. Screening may begin sooner depending on your family history and other health conditions.  Follow up colonoscopy as directed by your Gastroenterologist.  Screening for Osteoporosis- Screening begins at age 33 with bone density scanning, sooner if you are at higher risk for developing Osteoporosis.  Get these medicines  Calcium with Vitamin D-  Your body requires 1200-1500 mg of Calcium a day and [phone] IU of Vitamin D a day.  You can only absorb 500 mg of Calcium at  a time therefore Calcium must be taken in 2 or 3 separate doses throughout the day.  Hormones- Hormone therapy has been associated with increased risk for certain cancers and heart disease.  Talk to your healthcare provider about if you need relief from menopausal symptoms.  Aspirin- Ask your healthcare provider about taking Aspirin to prevent Heart Disease and Stroke.  Get these Immuniztions  Flu shot- Every fall  Pneumonia shot- Once after the age of 76; if you are younger ask your healthcare provider if you need a pneumonia shot.  Tetanus- Every ten years.  Zostavax- Once after the age of 35 to prevent shingles.  Take these steps  Don't smoke- Your healthcare provider can help you quit. For tips on how to quit, ask your healthcare provider or go to www.smokefree.gov or call 1-800 QUIT-NOW.  Be physically active- Exercise 5 days a week for a minimum of 30 minutes.  If you are not already physically active, start slow and gradually work up to 30 minutes of moderate physical activity.  Try walking, dancing, bike riding, swimming, etc.  Eat a healthy diet- Eat a variety of healthy foods such as fruits, vegetables, whole grains, low fat milk, low fat cheeses, yogurt, lean meats, chicken, fish, eggs, dried beans, tofu, etc.  For more information go to www.thenutritionsource.org  Dental visit- Brush and floss teeth twice daily; visit your dentist twice a year.  Eye exam- Visit your Optometrist or Ophthalmologist yearly.  Drink alcohol in moderation- Limit alcohol intake to one drink or less a day.  Never drink and drive.  Depression- Your emotional health is as important as your physical health.  If you're feeling down or losing interest in things you normally enjoy, please talk to your healthcare provider.  Seat Belts- can save your life; always wear  one  Smoke/Carbon Monoxide detectors- These detectors need to be installed on the appropriate level of your home.  Replace batteries at least once a year.  Violence- If anyone is threatening or hurting you, please tell your healthcare provider.  Living Will/ Health care power of attorney- Discuss with your healthcare provider and family.   IF you received an x-ray today, you will receive an invoice from [facility]. Please contact [facility] at [phone] with questions or concerns regarding your invoice.   IF you received labwork today, you will receive an invoice from [place]. Please contact [facility] at [phone] with questions or concerns regarding your invoice.   Our billing staff will not be able to assist you with questions regarding bills from these companies.  You will be contacted with the lab results as soon as they are available. The fastest way to get your results is to activate your My Chart account. Instructions are located on the last page of this paperwork. If you have not heard from [place] regarding the results in 2 weeks, please contact this office.       I personally performed the services described in this documentation, which was scribed in my presence. The recorded information has been reviewed and considered for accuracy and completeness, addended by me as needed, and agree with information above.  Signed,   [name], MD Primary [facility] at [place].  08/14/17 1:12 PM

## 2017-08-13 LAB — LIPID PANEL
CHOL/HDL RATIO: 2.1 ratio (ref 0.0–4.4)
CHOLESTEROL TOTAL: 134 mg/dL (ref 100–199)
HDL: 65 mg/dL (ref >39)
LDL Calculated: 59 mg/dL (ref 0–99)
Triglycerides: 50 mg/dL (ref 0–149)
VLDL CHOLESTEROL CAL: 10 mg/dL (ref 5–40)

## 2017-08-13 LAB — HEPATIC FUNCTION PANEL
ALBUMIN: 4.1 g/dL (ref 3.5–5.5)
ALT: 12 IU/L (ref 0–32)
AST: 25 IU/L (ref 0–40)
Alkaline Phosphatase: 91 IU/L (ref 39–117)
Bilirubin Total: 0.6 mg/dL (ref 0.0–1.2)
Bilirubin, Direct: 0.17 mg/dL (ref 0.00–0.40)
Total Protein: 8 g/dL (ref 6.0–8.5)

## 2017-08-13 LAB — URIC ACID: Uric Acid: 6.7 mg/dL (ref 2.5–7.1)

## 2017-08-15 ENCOUNTER — Encounter: Payer: Self-pay | Admitting: *Deleted

## 2017-08-21 ENCOUNTER — Encounter: Payer: Self-pay | Admitting: Gastroenterology

## 2017-09-03 ENCOUNTER — Ambulatory Visit: Payer: BC Managed Care – PPO | Admitting: Family Medicine

## 2017-09-30 ENCOUNTER — Encounter: Payer: Self-pay | Admitting: Family Medicine

## 2017-09-30 ENCOUNTER — Other Ambulatory Visit: Payer: Self-pay

## 2017-09-30 ENCOUNTER — Ambulatory Visit: Payer: BC Managed Care – PPO | Admitting: Family Medicine

## 2017-09-30 VITALS — BP 128/86 | HR 50 | Temp 98.3°F | Resp 16 | Ht 65.0 in | Wt 219.4 lb

## 2017-09-30 DIAGNOSIS — R001 Bradycardia, unspecified: Secondary | ICD-10-CM

## 2017-09-30 DIAGNOSIS — I10 Essential (primary) hypertension: Secondary | ICD-10-CM

## 2017-09-30 DIAGNOSIS — R9431 Abnormal electrocardiogram [ECG] [EKG]: Secondary | ICD-10-CM | POA: Diagnosis not present

## 2017-09-30 NOTE — Progress Notes (Signed)
Subjective:  By signing my name below, I, Moises Blood, attest that this documentation has been prepared under the direction and in the presence of [name], MD. Electronically Signed: Moises Blood, [place]. 09/30/2017 , 8:50 AM .  Patient was seen in Room 10 .   Patient ID: Candice [name], female    DOB: 1961/02/01, 56 y.o.   MRN: [id number] Chief Complaint  Patient presents with  . Referral    patient would like to discuss referral to cardiology, also has some issues with medication and paperwork from last visit   HPI Candice [name] is a 56 y.o. female  Patient has a history of HTN. She was most recently seen on Oct 1st with EKG performed with some abnormalities, including bradycardia, elevated AFB, possible ventricular hypertrophy. She has family history of heart disease in her older sister, who died from [place] in her late 28s and her mother with CHF diagnosed in her 76's. She was seen by cardiologist in Jan 2016; at that time, for concern of BP discrepancy in her arms, seen by [facility] in Jan 5th. She had an echocardiogram ordered, planned for office visit after testing. Echo on Jan 23rd showed grade 2 diastolic dysfunction, EF 24%, dilated left atrium, possible PFO, no significant valve abnormality. I did refer her back to cardiology at Oct 1st visit, given family history. At her last visit, her atenolol was decreased to 25mg  QD with her low heart rate of 56, and in the 40's on EKG.   Patient reports her life insurance is being cancelled after her cardiology referral. And then, her referral wasn't able to go through.   She also mentions her BP was elevated at a nurse visit towards end of Oct, while on atenolol 25mg  QD, with 2 separate readings in each arm: 130/100 in the left, and 182/100 in the right. She states she's been taking atenolol 50mg  QD and also losartan 50mg  QD. She denies lightheadedness or dizziness when standing up. She notes doing regular cardio  exercises. She denies dyspnea on exertion.   Patient Active Problem List   Diagnosis Date Noted  . Skin lesion of left ear 03/16/2016  . Skin lesion of right ear 03/16/2016  . Cardiomegaly 11/06/2014  . Hypertension 08/19/2013  . Gout 08/19/2013  . Allergic urticaria 06/23/2013  . Contact dermatitis 07/16/2012  . Vitamin D deficiency 11/12/2011  . Anemia 10/08/2011  . Hypokalemia 10/03/2011  . Obesities, morbid ([place]) 10/03/2011   Past Medical History:  Diagnosis Date  . Allergy   . Heart murmur   . Hypertension    Past Surgical History:  Procedure Laterality Date  . OVARIAN CYST REMOVAL Left    20 yrs ago  . TUBAL LIGATION     Allergies  Allergen Reactions  . Ace Inhibitors Nausea And Vomiting  . Allopurinol Nausea Only    Chills, headache, sore throat, sweats. No rash.    Prior to Admission medications   Medication Sig Start Date End Date Taking? Authorizing Provider  atenolol (TENORMIN) 25 MG tablet Take 1 tablet (25 mg total) by mouth daily. 08/12/17   Wendie Agreste, MD  colchicine 0.6 MG tablet Take 1 tablet (0.6 mg total) by mouth daily. 08/12/17   Wendie Agreste, MD  cyclobenzaprine (FLEXERIL) 5 MG tablet TAKE 1/2 TO 1 TABLET BY MOUTH AT BEDTIME AS NEEDED 10/14/16   Wendie Agreste, MD  Emollient (EUCERIN) lotion Apply topically as needed for dry skin. 04/04/14   Roselee Culver,  MD  levocetirizine (XYZAL) 5 MG tablet TAKE 1 TABLET (5 MG TOTAL) BY MOUTH EVERY EVENING. 01/09/17   Tereasa Coop, PA-C  losartan (COZAAR) 50 MG tablet Take 1 tablet (50 mg total) by mouth daily. 06/24/17   Wendie Agreste, MD  triamcinolone cream (KENALOG) 0.1 % Apply 1 application topically 2 (two) times daily as needed. 05/30/16   Wendie Agreste, MD   Social History   Socioeconomic History  . Marital status: Divorced    Spouse name: Not on file  . Number of children: Not on file  . Years of education: Not on file  . Highest education level: Not on file  Social Needs   . Financial resource strain: Not on file  . Food insecurity - worry: Not on file  . Food insecurity - inability: Not on file  . Transportation needs - medical: Not on file  . Transportation needs - non-medical: Not on file  Occupational History  . Not on file  Tobacco Use  . Smoking status: Never Smoker  . Smokeless tobacco: Never Used  Substance and Sexual Activity  . Alcohol use: No  . Drug use: No  . Sexual activity: Yes    Birth control/protection: Surgical  Other Topics Concern  . Not on file  Social History Narrative  . Not on file   Review of Systems  Constitutional: Negative for fatigue and unexpected weight change.  Respiratory: Negative for chest tightness and shortness of breath.   Cardiovascular: Negative for chest pain, palpitations and leg swelling.  Gastrointestinal: Negative for abdominal pain and blood in stool.  Neurological: Negative for dizziness, syncope, light-headedness and headaches.       Objective:   Physical Exam  Constitutional: She is oriented to person, place, and time. She appears well-developed and well-nourished.  HENT:  Head: Normocephalic and atraumatic.  Eyes: Conjunctivae and EOM are normal. Pupils are equal, round, and reactive to light.  Neck: Carotid bruit is not present.  Cardiovascular: Regular rhythm, normal heart sounds and intact distal pulses. Bradycardia present.  Pulmonary/Chest: Effort normal and breath sounds normal.  Abdominal: Soft. She exhibits no pulsatile midline mass. There is no tenderness.  Neurological: She is alert and oriented to person, place, and time.  Skin: Skin is warm and dry.  Psychiatric: She has a normal mood and affect. Her behavior is normal.  Vitals reviewed.   Vitals:   09/30/17 0817 09/30/17 0822  BP: 126/84 128/86  Pulse: (!) 50   Resp: 16   Temp: 98.3 F (36.8 C)   TempSrc: Oral   SpO2: 100%   Weight: 219 lb 6.4 oz (99.5 kg)   Height: 5\' 5"  (1.651 m)        Assessment & Plan:    Candice [name] is a 56 y.o. female Essential hypertension  - Elevated at home on lower dose of atenolol. That was initially decreased due to marked sinus bradycardia on her EKG, but she is asymptomatic.   -Blood pressure is under improved control back on atenolol 50 mg daily, losartan 50mg  daily. Option of increasing losartan, decreasing atenolol discussed, but chose to remain on same medication for now and can discuss doses with cardiology.  - If symptomatic, discuss changes above and let me know if med refills needed  Bradycardia Nonspecific abnormal electrocardiogram (ECG) (EKG)  - Nonspecific EKG, similar abnormalities in 2016. Does have family history of early cardiac disease, but she is asymptomatic and exercises regularly without symptoms. Previous echocardiogram noted with some abnormalities.  Do not see that she had a follow-up with cardiology after that echo to discuss if other testing needed.  - Recent concern/question regarding her life insurance, and will need cardiology evaluation to help clarify if there are specific risks or they could possibly provide a letter for her insurance to reinstate. Will obtain appointment with [facility].   No orders of the defined types were placed in this encounter.  Patient Instructions   I will work on getting you an appointment [facility] to review your prior echocardiogram, EKG and can discuss a letter for your insurance from cardiology if needed.  OK to continue same dose of meds for now, but if any lightheadedness with standing, would decrease atenolol and increase losartan as discussed. If any questions - please let me know. Thanks for coming in to discuss these concerns.    IF you received an x-ray today, you will receive an invoice from [facility]. Please contact [facility] at [phone] with questions or concerns regarding your invoice.   IF you received labwork today, you will  receive an invoice from [facility]. Please contact [facility] at [phone] with questions or concerns regarding your invoice.   Our billing staff will not be able to assist you with questions regarding bills from these companies.  You will be contacted with the lab results as soon as they are available. The fastest way to get your results is to activate your My Chart account. Instructions are located on the last page of this paperwork. If you have not heard from [place] regarding the results in 2 weeks, please contact this office.      I personally performed the services described in this documentation, which was scribed in my presence. The recorded information has been reviewed and considered for accuracy and completeness, addended by me as needed, and agree with information above.  Signed,   [name], MD Primary Care at [place].  09/30/17 9:02 AM

## 2017-09-30 NOTE — Patient Instructions (Addendum)
I will work on getting you an appointment with [facility] to review your prior echocardiogram, EKG and can discuss a letter for your insurance from cardiology if needed.  OK to continue same dose of meds for now, but if any lightheadedness with standing, would decrease atenolol and increase losartan as discussed. If any questions - please let me know. Thanks for coming in to discuss these concerns.    IF you received an x-ray today, you will receive an invoice from [facility]. Please contact [facility] at [phone] with questions or concerns regarding your invoice.   IF you received labwork today, you will receive an invoice from [place]. Please contact [facility] at [phone] with questions or concerns regarding your invoice.   Our billing staff will not be able to assist you with questions regarding bills from these companies.  You will be contacted with the lab results as soon as they are available. The fastest way to get your results is to activate your My Chart account. Instructions are located on the last page of this paperwork. If you have not heard from [place] regarding the results in 2 weeks, please contact this office.

## 2017-10-01 ENCOUNTER — Other Ambulatory Visit: Payer: Self-pay

## 2017-10-01 ENCOUNTER — Ambulatory Visit (AMBULATORY_SURGERY_CENTER): Payer: Self-pay | Admitting: *Deleted

## 2017-10-01 VITALS — Ht 65.0 in | Wt 217.0 lb

## 2017-10-01 DIAGNOSIS — Z1211 Encounter for screening for malignant neoplasm of colon: Secondary | ICD-10-CM

## 2017-10-01 MED ORDER — NA SULFATE-K SULFATE-MG SULF 17.5-3.13-1.6 GM/177ML PO SOLN: 1.0000 | Freq: Once | ORAL | 0 refills | Status: AC

## 2017-10-01 NOTE — Progress Notes (Signed)
No egg or soy allergy known to patient  No issues with past sedation with any surgeries  or procedures, no intubation problems  No diet pills per patient No home 02 use per patient  No blood thinners per patient  Pt denies issues with constipation  No A fib or A flutter  EMMI video sent to pt's e mail - pt declined- has looked at videos on line

## 2017-10-02 ENCOUNTER — Encounter: Payer: Self-pay | Admitting: Gastroenterology

## 2017-10-08 ENCOUNTER — Ambulatory Visit: Payer: BC Managed Care – PPO | Admitting: Interventional Cardiology

## 2017-10-08 ENCOUNTER — Encounter: Payer: Self-pay | Admitting: Interventional Cardiology

## 2017-10-08 VITALS — BP 158/108 | HR 53 | Ht 65.0 in | Wt 222.8 lb

## 2017-10-08 DIAGNOSIS — I10 Essential (primary) hypertension: Secondary | ICD-10-CM

## 2017-10-08 DIAGNOSIS — R9431 Abnormal electrocardiogram [ECG] [EKG]: Secondary | ICD-10-CM

## 2017-10-08 DIAGNOSIS — I119 Hypertensive heart disease without heart failure: Secondary | ICD-10-CM

## 2017-10-08 HISTORY — DX: Hypertensive heart disease without heart failure: I11.9

## 2017-10-08 HISTORY — DX: Abnormal electrocardiogram (ECG) (EKG): R94.31

## 2017-10-08 MED ORDER — LOSARTAN POTASSIUM-HCTZ 100-12.5 MG PO TABS: 1.0000 | ORAL_TABLET | Freq: Every day | ORAL | 3 refills | Status: DC

## 2017-10-08 NOTE — Progress Notes (Signed)
Cardiology Office Note    Date:  10/08/2017   ID:  Candice [name], DOB Jun 19, 1961, MRN [id number]  PCP:  Wendie Agreste, MD  Cardiologist: Sinclair Grooms, MD   Chief Complaint  Patient presents with  . Advice Only    Hypertension    History of Present Illness:  Candice [name] is a 56 y.o. female referred by [name], MD for evaluation and management of hypertension and abnormalities noted on echocardiography and EKG.  She is referred today by Dr. Carlota Raspberry for management suggestions concerning hypertension, and abnormal echocardiogram, and ECG.  The patient has no complaints.  She states that she is here because her doctor had concerns.  She does voice 1 concern which is that she was turned down for life insurance based upon information obtained from her medical records.  The echocardiogram performed by Dr. Einar Gip from 2016 revealed moderate left ventricular hypertrophy.  The electrocardiograms performed recently revealed left ventricular hypertrophy with strain.  The EKG also shows a pattern of left anterior hemiblock.  She denies chest pain, exertional dyspnea, physical limitations, edema, orthopnea, palpitations, and syncope.  Past Medical History:  Diagnosis Date  . Allergy   . Gout    great right toe  . Heart murmur   . Hypertension     Past Surgical History:  Procedure Laterality Date  . OVARIAN CYST REMOVAL Left    20 yrs ago  . TUBAL LIGATION      Current Medications: Outpatient Medications Prior to Visit  Medication Sig Dispense Refill  . atenolol (TENORMIN) 50 MG tablet Take 50 mg daily by mouth.  1  . colchicine 0.6 MG tablet Take 1 tablet (0.6 mg total) by mouth daily. 90 tablet 1  . cyclobenzaprine (FLEXERIL) 5 MG tablet Take 5 mg by mouth at bedtime as needed for muscle spasms.    . Emollient (EUCERIN) lotion Apply topically onto the skin as needed for dry skin.    Marland Kitchen ibuprofen (ADVIL,MOTRIN) 100 MG tablet Take 100-200 mg by mouth every 6 (six)  hours as needed for pain.     Marland Kitchen levocetirizine (XYZAL) 5 MG tablet Take 5 mg by mouth at bedtime as needed for allergies.    Marland Kitchen losartan (COZAAR) 50 MG tablet Take 1 tablet (50 mg total) by mouth daily. 90 tablet 1  . triamcinolone cream (KENALOG) 0.1 % Apply 1 application topically 2 (two) times daily as needed (rash).    Marland Kitchen atenolol (TENORMIN) 25 MG tablet Take 1 tablet (25 mg total) by mouth daily. (Patient not taking: Reported on 10/08/2017) 90 tablet 1  . cyclobenzaprine (FLEXERIL) 5 MG tablet TAKE 1/2 TO 1 TABLET BY MOUTH AT BEDTIME AS NEEDED (Patient not taking: Reported on 10/01/2017) 15 tablet 0  . Emollient (EUCERIN) lotion Apply topically as needed for dry skin. (Patient not taking: Reported on 10/08/2017) 240 mL 0  . levocetirizine (XYZAL) 5 MG tablet TAKE 1 TABLET (5 MG TOTAL) BY MOUTH EVERY EVENING. (Patient not taking: Reported on 10/01/2017) 30 tablet 8  . triamcinolone cream (KENALOG) 0.1 % Apply 1 application topically 2 (two) times daily as needed. (Patient not taking: Reported on 10/08/2017) 30 g 0   No facility-administered medications prior to visit.      Allergies:   Ace inhibitors and Allopurinol   Social History   Socioeconomic History  . Marital status: Divorced    Spouse name: None  . Number of children: None  . Years of education: None  . Highest  education level: None  Social Needs  . Financial resource strain: None  . Food insecurity - worry: None  . Food insecurity - inability: None  . Transportation needs - medical: None  . Transportation needs - non-medical: None  Occupational History  . None  Tobacco Use  . Smoking status: Never Smoker  . Smokeless tobacco: Never Used  Substance and Sexual Activity  . Alcohol use: No  . Drug use: No  . Sexual activity: Yes    Birth control/protection: Surgical  Other Topics Concern  . None  Social History Narrative  . None     Family History:  The patient's family history includes Aneurysm in her sister;  Heart disease in her sister; Stroke in her mother.   ROS:   Please see the history of present illness.    Voices no complaints.  Anxiety because she cannot purchase life insurance. All other systems reviewed and are negative.   PHYSICAL EXAM:   VS:  BP (!) 158/108   Pulse (!) 53   Ht 5\' 5"  (1.651 m)   Wt 222 lb 12.8 oz (101.1 kg)   BMI 37.08 kg/m    GEN: Well nourished, well developed, in no acute distress  HEENT: normal  Neck: no JVD, carotid bruits, or masses Cardiac: RRR; no murmurs, rubs, or edema.  S4 gallop is audible. Respiratory:  clear to auscultation bilaterally, normal work of breathing GI: soft, nontender, nondistended, + BS MS: no deformity or atrophy  Skin: warm and dry, no rash Neuro:  Alert and Oriented x 3, Strength and sensation are intact Psych: euthymic mood, full affect  Wt Readings from Last 3 Encounters:  10/08/17 222 lb 12.8 oz (101.1 kg)  10/01/17 217 lb (98.4 kg)  09/30/17 219 lb 6.4 oz (99.5 kg)      Studies/Labs Reviewed:   EKG:  EKG reviewed tracings recently performed and they reveal left atrial abnormality, intraventricular conduction delay with left anterior hemiblock, and voltage criteria for LVH.  Tracings unchanged when compared to those from 2015.  Recent Labs: 04/25/2017: Hemoglobin 11.6; Platelets 237 06/21/2017: BUN 10; Creatinine, Ser 0.87; Potassium 4.4; Sodium 142 08/12/2017: ALT 12   Lipid Panel    Component Value Date/Time   CHOL 134 08/12/2017 0949   TRIG 50 08/12/2017 0949   HDL 65 08/12/2017 0949   CHOLHDL 2.1 08/12/2017 0949   CHOLHDL 2.3 02/24/2016 1010   VLDL 13 02/24/2016 1010   LDLCALC 59 08/12/2017 0949    Additional studies/ records that were reviewed today include:  Echocardiography performed by Dr. Einar Gip in 2016 revealed moderate left ventricular hypertrophy, normal systolic function with EF greater than 60%, left atrial abnormality, and grade 2 diastolic dysfunction.    ASSESSMENT:    1. Essential  hypertension   2. Hypertensive left ventricular hypertrophy, without heart failure   3. Abnormal EKG      PLAN:  In order of problems listed above:  1. Poor, asymmetric blood pressure elevation with recording of 180/110 mmHg in the right arm and 142/100 mmHg in the left arm.  This asymmetric blood pressure finding is not new.  She denies left arm fatigue with repetitive activity.  She needs more aggressive blood pressure control to a target less than 140/90 and preferably 130/85 mmHg.  The change made today is to switch Cozaar to Hyzaar 100/12.5 mg/day.  Continue Tenormin 50 mg/day, consider adding amlodipine if blood pressure does not come under better control with the addition of diuretic therapy.  Blood pressure clinic  in 2-3 weeks.  Basic metabolic panel in 2 weeks.  Creatinine was normal in August 2018 at 0.87. 2. Left ventricular hypertrophy will improve with blood pressure control 3. ECG appearance may improve with blood pressure control  Clinical follow-up with me in 3 months.  Enrolled in blood pressure clinic.  Change made today is to maximize losartan and add low-dose diuretic therapy.  Next step will be to add calcium channel blocker perhaps in the form of amlodipine 5 mg/day.  He made eventually need to perform a left upper extremity angiogram or duplex study to rule out subclavian stenosis.    Medication Adjustments/Labs and Tests Ordered: Current medicines are reviewed at length with the patient today.  Concerns regarding medicines are outlined above.  Medication changes, Labs and Tests ordered today are listed in the Patient Instructions below. There are no Patient Instructions on file for this visit.   Signed, Sinclair Grooms, MD  10/08/2017 11:10 AM    [facility] [address], [place], [place]  [address] Phone: [phone]; Fax: [phone]

## 2017-10-08 NOTE — Patient Instructions (Signed)
Medication Instructions:  1) DISCONTINUE Cozaar 2) START Hyzaar 100/12.5mg  once daily  Labwork: Your physician recommends that you return for lab work in: 2-3 weeks at same time of your Hypertension Clinic appointment.  (BMET)   Testing/Procedures: None  Follow-Up: Your physician recommends that you schedule a follow-up appointment in: 2-3 weeks with our Hypertension Clinic.  Your physician recommends that you schedule a follow-up appointment in: 3-4 months with Dr. Tamala Julian.     Any Other Special Instructions Will Be Listed Below (If Applicable).     If you need a refill on your cardiac medications before your next appointment, please call your pharmacy.

## 2017-10-14 ENCOUNTER — Ambulatory Visit (AMBULATORY_SURGERY_CENTER): Payer: BC Managed Care – PPO | Admitting: Gastroenterology

## 2017-10-14 ENCOUNTER — Encounter: Payer: Self-pay | Admitting: Gastroenterology

## 2017-10-14 ENCOUNTER — Other Ambulatory Visit: Payer: Self-pay

## 2017-10-14 VITALS — BP 109/79 | HR 62 | Temp 97.8°F | Resp 16 | Ht 65.0 in | Wt 217.0 lb

## 2017-10-14 DIAGNOSIS — Z1212 Encounter for screening for malignant neoplasm of rectum: Secondary | ICD-10-CM

## 2017-10-14 DIAGNOSIS — Z1211 Encounter for screening for malignant neoplasm of colon: Secondary | ICD-10-CM

## 2017-10-14 MED ORDER — SODIUM CHLORIDE 0.9 % IV SOLN: 500.0000 mL | INTRAVENOUS | Status: DC

## 2017-10-14 NOTE — Op Note (Signed)
[place] Patient Name: Candice [name] Procedure Date: 10/14/2017 8:00 AM MRN: [id number] Endoscopist: Remo Lipps P. Armbruster MD, MD Age: 56 Referring MD:  Date of Birth: [birth year] Gender: Female Account #: [id number] Procedure:                Colonoscopy Indications:              Screening for colorectal malignant neoplasm Medicines:                Monitored Anesthesia Care Procedure:                Pre-Anesthesia Assessment:                           - Prior to the procedure, a History and Physical                            was performed, and patient medications and                            allergies were reviewed. The patient's tolerance of                            previous anesthesia was also reviewed. The risks                            and benefits of the procedure and the sedation                            options and risks were discussed with the patient.                            All questions were answered, and informed consent                            was obtained. Prior Anticoagulants: The patient has                            taken no previous anticoagulant or antiplatelet                            agents. ASA Grade Assessment: II - A patient with                            mild systemic disease. After reviewing the risks                            and benefits, the patient was deemed in                            satisfactory condition to undergo the procedure.                           After obtaining informed consent, the colonoscope  was passed under direct vision. Throughout the                            procedure, the patient's blood pressure, pulse, and                            oxygen saturations were monitored continuously. The                            Colonoscope was introduced through the anus and                            advanced to the the terminal ileum, with                            identification of  the appendiceal orifice and IC                            valve. The colonoscopy was performed without                            difficulty. The patient tolerated the procedure                            well. The quality of the bowel preparation was                            good. The terminal ileum, ileocecal valve,                            appendiceal orifice, and rectum were photographed. Scope In: 8:07:57 AM Scope Out: 8:21:05 AM Scope Withdrawal Time: 0 hours 11 minutes 15 seconds  Total Procedure Duration: 0 hours 13 minutes 8 seconds  Findings:                 The perianal and digital rectal examinations were                            normal.                           The terminal ileum appeared normal.                           A few small-mouthed diverticula were found in the                            sigmoid colon.                           Internal hemorrhoids were found during                            retroflexion. The hemorrhoids were small.  The exam was otherwise without abnormality. No                            polyps Complications:            No immediate complications. Estimated blood loss:                            None. Estimated Blood Loss:     Estimated blood loss: none. Impression:               - The examined portion of the ileum was normal.                           - Diverticulosis in the sigmoid colon.                           - Internal hemorrhoids.                           - The examination was otherwise normal.                           - No specimens collected. Recommendation:           - Patient has a contact number available for                            emergencies. The signs and symptoms of potential                            delayed complications were discussed with the                            patient. Return to normal activities tomorrow.                            Written discharge instructions were  provided to the                            patient.                           - Resume previous diet.                           - Continue present medications.                           - Repeat colonoscopy in 10 years for screening                            purposes. Remo Lipps P. Armbruster MD, MD 10/14/2017 8:24:26 AM This report has been signed electronically.

## 2017-10-14 NOTE — Progress Notes (Signed)
A/ox3 pleased with MAC, report to Jane RN 

## 2017-10-14 NOTE — Patient Instructions (Signed)
Impression/Recommendations:  Diverticulosis handout given to patient. Hemorrhoid handout given to patient.  Resume previous diet. Continue present medications.  Repeat colonoscopy in 10 years for screening purposes.  YOU HAD AN ENDOSCOPIC PROCEDURE TODAY AT THE [facility]:   Refer to the procedure report that was given to you for any specific questions about what was found during the examination.  If the procedure report does not answer your questions, please call your gastroenterologist to clarify.  If you requested that your care partner not be given the details of your procedure findings, then the procedure report has been included in a sealed envelope for you to review at your convenience later.  YOU SHOULD EXPECT: Some feelings of bloating in the abdomen. Passage of more gas than usual.  Walking can help get rid of the air that was put into your GI tract during the procedure and reduce the bloating. If you had a lower endoscopy (such as a colonoscopy or flexible sigmoidoscopy) you may notice spotting of blood in your stool or on the toilet paper. If you underwent a bowel prep for your procedure, you may not have a normal bowel movement for a few days.  Please Note:  You might notice some irritation and congestion in your nose or some drainage.  This is from the oxygen used during your procedure.  There is no need for concern and it should clear up in a day or so.  SYMPTOMS TO REPORT IMMEDIATELY:   Following lower endoscopy (colonoscopy or flexible sigmoidoscopy):  Excessive amounts of blood in the stool  Significant tenderness or worsening of abdominal pains  Swelling of the abdomen that is new, acute  Fever of 100F or higher For urgent or emergent issues, a gastroenterologist can be reached at any hour by calling [phone].   DIET:  We do recommend a small meal at first, but then you may proceed to your regular diet.  Drink plenty of fluids but you should avoid  alcoholic beverages for 24 hours.  ACTIVITY:  You should plan to take it easy for the rest of today and you should NOT DRIVE or use heavy machinery until tomorrow (because of the sedation medicines used during the test).    FOLLOW UP: Our staff will call the number listed on your records the next business day following your procedure to check on you and address any questions or concerns that you may have regarding the information given to you following your procedure. If we do not reach you, we will leave a message.  However, if you are feeling well and you are not experiencing any problems, there is no need to return our call.  We will assume that you have returned to your regular daily activities without incident.  If any biopsies were taken you will be contacted by phone or by letter within the next 1-3 weeks.  Please call us at [phone] if you have not heard about the biopsies in 3 weeks.    SIGNATURES/CONFIDENTIALITY: You and/or your care partner have signed paperwork which will be entered into your electronic medical record.  These signatures attest to the fact that that the information above on your After Visit Summary has been reviewed and is understood.  Full responsibility of the confidentiality of this discharge information lies with you and/or your care-partner. 

## 2017-10-14 NOTE — Progress Notes (Signed)
Pt's states no medical or surgical changes since previsit or office visit. 

## 2017-10-15 ENCOUNTER — Telehealth: Payer: Self-pay

## 2017-10-15 NOTE — Telephone Encounter (Signed)
  Follow up Call-  Call back number 10/14/2017  Post procedure Call Back phone  # [phone]  Permission to leave phone message Yes  Some recent data might be hidden     Patient questions:  Do you have a fever, pain , or abdominal swelling? No. Pain Score  0 *  Have you tolerated food without any problems? Yes.    Have you been able to return to your normal activities? Yes.    Do you have any questions about your discharge instructions: Diet   No. Medications  No. Follow up visit  No.  Do you have questions or concerns about your Care? No.  Actions: * If pain score is 4 or above: No action needed, pain <4.

## 2017-10-22 ENCOUNTER — Other Ambulatory Visit: Payer: BC Managed Care – PPO

## 2017-10-22 ENCOUNTER — Ambulatory Visit: Payer: BC Managed Care – PPO

## 2017-10-23 ENCOUNTER — Other Ambulatory Visit: Payer: BC Managed Care – PPO | Admitting: *Deleted

## 2017-10-23 ENCOUNTER — Ambulatory Visit (INDEPENDENT_AMBULATORY_CARE_PROVIDER_SITE_OTHER): Payer: BC Managed Care – PPO | Admitting: Pharmacist

## 2017-10-23 ENCOUNTER — Encounter: Payer: Self-pay | Admitting: Pharmacist

## 2017-10-23 VITALS — BP 134/88 | HR 52

## 2017-10-23 DIAGNOSIS — I10 Essential (primary) hypertension: Secondary | ICD-10-CM | POA: Diagnosis not present

## 2017-10-23 DIAGNOSIS — I119 Hypertensive heart disease without heart failure: Secondary | ICD-10-CM

## 2017-10-23 NOTE — Patient Instructions (Signed)
Return for a follow up appointment in 3-4 weeks  Check your blood pressure at home daily (if able) and keep record of the readings.  Take your BP meds as follows: CONTINUE ALL AS PRESCRIBED  Work on [employer]. Dew and watching diet.   Bring all of your meds, your BP cuff and your record of home blood pressures to your next appointment.  Exercise as you're able, try to walk approximately 30 minutes per day.  Keep salt intake to a minimum, especially watch canned and prepared boxed foods.  Eat more fresh fruits and vegetables and fewer canned items.  Avoid eating in fast food restaurants.    HOW TO TAKE YOUR BLOOD PRESSURE: . Rest 5 minutes before taking your blood pressure. .  Don't smoke or drink caffeinated beverages for at least 30 minutes before. . Take your blood pressure before (not after) you eat. . Sit comfortably with your back supported and both feet on the floor (don't cross your legs). . Elevate your arm to heart level on a table or a desk. . Use the proper sized cuff. It should fit smoothly and snugly around your bare upper arm. There should be enough room to slip a fingertip under the cuff. The bottom edge of the cuff should be 1 inch above the crease of the elbow. . Ideally, take 3 measurements at one sitting and record the average.

## 2017-10-23 NOTE — Progress Notes (Signed)
Patient ID: Candice [name]                 DOB: 09-04-1961                      MRN: [id number]     HPI: Candice [name] is a 56 y.o. female patient of Dr.Smith who presents today for hypertension evaluation.  PMH includes HTN. ECHO from 2016 with Dr. Einar Gip revealed moderate left ventricular hypertrophy. She was referred by PCP for blood pressure management as well as an abnormal EKG. At her most recent visit with Dr. Tamala Julian, her Cozaar was changed to Hyzaar. It was noted that her pressure was 180/110 in her right arm and 142/100 in her left arm. It was also suggested to start amlodipine if pressures not at goal today.   She presents today for a blood pressure check. She states that since her visit with Dr. Tamala Julian she has been taking her medications as prescribed. She denies missed doses prior to last visit or today. She states she was somewhat stressed at last visit.   She states she feels well on her current medication regimen and denies dizziness, SOB, Chest pain. She does endorse she has been trying to do better with diet and exercise since her last cardiology visit.   Current HTN meds:  Atenolol 50mg  daily in morning Losartan/HCTZ 100/12.5mg  daily in the morning  BP goal: <130/85 (per Dr. Tamala Julian)  Family History: Aneurysm in her sister; Heart disease in her sister; Stroke in her mother.  Social History: No tobacco products, no alcohol.   Diet: Most meals prepared from home. Uses salt substitute mostly. She drinks mostly diet [place]. She was sipping on [place] on the way here. Generally drinks from 3-5 bottles per day.   Exercise: She does online videos for 15-30 minutes 3 times per week.   Home BP readings: BP cuff at home (wrist cuff) - 128/76   Wt Readings from Last 3 Encounters:  10/14/17 217 lb (98.4 kg)  10/08/17 222 lb 12.8 oz (101.1 kg)  10/01/17 217 lb (98.4 kg)   BP Readings from Last 3 Encounters:  10/23/17 134/88  10/14/17 109/79  10/08/17 (!) 158/108   Pulse  Readings from Last 3 Encounters:  10/23/17 (!) 52  10/14/17 62  10/08/17 (!) 53    Renal function: CrCl cannot be calculated (Patient's most recent lab result is older than the maximum 21 days allowed.).  Past Medical History:  Diagnosis Date  . Allergy   . Gout    great right toe  . Heart murmur   . Hypertension     Current Outpatient Medications on File Prior to Visit  Medication Sig Dispense Refill  . atenolol (TENORMIN) 50 MG tablet Take 50 mg daily by mouth.  1  . colchicine 0.6 MG tablet Take 1 tablet (0.6 mg total) by mouth daily. 90 tablet 1  . cyclobenzaprine (FLEXERIL) 5 MG tablet Take 5 mg by mouth at bedtime as needed for muscle spasms.    . Emollient (EUCERIN) lotion Apply topically onto the skin as needed for dry skin.    Marland Kitchen ibuprofen (ADVIL,MOTRIN) 100 MG tablet Take 100-200 mg by mouth every 6 (six) hours as needed for pain.     Marland Kitchen levocetirizine (XYZAL) 5 MG tablet Take 5 mg by mouth at bedtime as needed for allergies.    Marland Kitchen losartan-hydrochlorothiazide (HYZAAR) 100-12.5 MG tablet Take 1 tablet by mouth daily. 90 tablet 3  .  triamcinolone cream (KENALOG) 0.1 % Apply 1 application topically 2 (two) times daily as needed (rash).     No current facility-administered medications on file prior to visit.     Allergies  Allergen Reactions  . Ace Inhibitors Nausea And Vomiting  . Allopurinol Nausea Only    Chills, headache, sore throat, sweats. No rash.     Blood pressure 134/88, pulse (!) 52.   Assessment/Plan: Hypertension: BP today is borderline at goal with significant improvement with addition of HCTZ 12.5mg  daily. Seems unusual that BP would drop this significantly with only 12.5mg  of HCTZ, but will defer initiation of additional agent today due to this drop. She will continue to work on diet and exercise and decrease her caffeine intake. She will follow up with HTN clinic in 3-4 weeks with BP log from home. If at that time her pressures remain borderline at  goal will plan to start amlodipine. Pt is in agreement with plan. Labs today.    Thank you, Lelan Pons. Patterson Hammersmith, [facility]  10/23/2017 3:47 PM

## 2017-10-24 LAB — BASIC METABOLIC PANEL
BUN / CREAT RATIO: 16 (ref 9–23)
BUN: 15 mg/dL (ref 6–24)
CHLORIDE: 105 mmol/L (ref 96–106)
CO2: 17 mmol/L — ABNORMAL LOW (ref 20–29)
Calcium: 9.9 mg/dL (ref 8.7–10.2)
Creatinine, Ser: 0.94 mg/dL (ref 0.57–1.00)
GFR calc non Af Amer: 67 mL/min/{1.73_m2} (ref >59)
GFR, EST AFRICAN AMERICAN: 77 mL/min/{1.73_m2} (ref >59)
Glucose: 81 mg/dL (ref 65–99)
POTASSIUM: 4.3 mmol/L (ref 3.5–5.2)
Sodium: 145 mmol/L — ABNORMAL HIGH (ref 134–144)

## 2017-11-19 NOTE — Progress Notes (Signed)
Patient ID: BRUNHILDA LANS                 DOB: 10-14-1961                      MRN: [id number]     HPI: Candice [name] is a 57 y.o. female patient of Dr. Katrinka Blazing who presents today for hypertension evaluation.  PMH includes HTN. ECHO from 2016 with Dr. Jacinto Halim revealed moderate left ventricular hypertrophy. She was referred by PCP for blood pressure management as well as an abnormal EKG. At her most recent visit with Dr. Katrinka Blazing, her Cozaar was changed to Hyzaar. It was noted that her pressure was 180/110 in her right arm and 142/100 in her left arm. At follow up visit, BP improved significantly to 134/88 and additional BP medication changes were deferred. BMET was stable. Pt presents today for follow up.  Pt presents today in good spirits. She denies dizziness, blurred vision, falls, or headache. She has cut back on her caffeine intake and switched ~2/3 of her soda intake from regular to diet caffeine-free [facility]. She has not been exercising as much lately but wants to start up with her exercise videos again. She has a wrist BP cuff at home and systolic readings have generally been in the 130s. She took both of her BP medications this morning.  BP readings have previously noted a discrepancy between her two arms with BP reading in her right arm trending higher. Readings in clinic today as follows:  -Left arm: 114/84 -Right arm: 120/82  Current HTN meds:  Atenolol 50mg  daily in morning Losartan/HCTZ 100/12.5mg  daily in the morning  BP goal: <130/85 (per Dr. Katrinka Blazing)  Family History: Aneurysm in her sister; Heart disease in her sister; Stroke in her mother.  Social History: No tobacco products, no alcohol.   Diet: Most meals prepared from home. Uses salt substitute mostly. She drinks mostly diet Mt. Dew but has switched 2/3 to caffeine free. Generally drinks from 3-5 bottles per day.   Exercise: Online exercise videos for 15-30 minutes 3 times per week.   Home BP readings: BP cuff  at home (wrist cuff) - 128/76   Wt Readings from Last 3 Encounters:  10/14/17 217 lb (98.4 kg)  10/08/17 222 lb 12.8 oz (101.1 kg)  10/01/17 217 lb (98.4 kg)   BP Readings from Last 3 Encounters:  10/23/17 134/88  10/14/17 109/79  10/08/17 (!) 158/108   Pulse Readings from Last 3 Encounters:  10/23/17 (!) 52  10/14/17 62  10/08/17 (!) 53    Renal function: CrCl cannot be calculated (Patient's most recent lab result is older than the maximum 21 days allowed.).  Past Medical History:  Diagnosis Date  . Allergy   . Gout    great right toe  . Heart murmur   . Hypertension     Current Outpatient Medications on File Prior to Visit  Medication Sig Dispense Refill  . atenolol (TENORMIN) 50 MG tablet Take 50 mg daily by mouth.  1  . colchicine 0.6 MG tablet Take 1 tablet (0.6 mg total) by mouth daily. 90 tablet 1  . cyclobenzaprine (FLEXERIL) 5 MG tablet Take 5 mg by mouth at bedtime as needed for muscle spasms.    . Emollient (EUCERIN) lotion Apply topically onto the skin as needed for dry skin.    Marland Kitchen ibuprofen (ADVIL,MOTRIN) 100 MG tablet Take 100-200 mg by mouth every 6 (six) hours as needed for pain.     Marland Kitchen  levocetirizine (XYZAL) 5 MG tablet Take 5 mg by mouth at bedtime as needed for allergies.    Marland Kitchen losartan-hydrochlorothiazide (HYZAAR) 100-12.5 MG tablet Take 1 tablet by mouth daily. 90 tablet 3  . triamcinolone cream (KENALOG) 0.1 % Apply 1 application topically 2 (two) times daily as needed (rash).     No current facility-administered medications on file prior to visit.     Allergies  Allergen Reactions  . Ace Inhibitors Nausea And Vomiting  . Allopurinol Nausea Only    Chills, headache, sore throat, sweats. No rash.      Assessment/Plan:  1. Hypertension - BP much improved and at goal <130/53mmHg since switching to losartan-HCTZ with f/u BMET stable. Will continue atenolol 50mg  daily and losartan-HCTZ 100-12.5mg  daily. Advised pt to continue monitoring her BP at  home and to call clinic if readings consistently trend > 140 systolic. F/u with Dr Katrinka Blazing next month as scheduled.  Forestine Macho E. Wilmon Conover, PharmD, CPP, BCACP [facility] [address] [place], [place] [address] Phone: [phone]; Fax: [phone] 11/20/2017 11:52 AM

## 2017-11-20 ENCOUNTER — Ambulatory Visit (INDEPENDENT_AMBULATORY_CARE_PROVIDER_SITE_OTHER): Payer: BC Managed Care – PPO | Admitting: Pharmacist

## 2017-11-20 VITALS — BP 114/84 | HR 48

## 2017-11-20 DIAGNOSIS — I10 Essential (primary) hypertension: Secondary | ICD-10-CM

## 2017-11-20 NOTE — Patient Instructions (Signed)
It was nice to see you today  Continue taking your current medications - your blood pressure looks excellent today and is at goal less than 130/80  Call clinic if you notice your blood pressure becoming elevated consistently above 140 for the top reading  Follow up with Dr Tamala Julian next month as scheduled

## 2017-12-10 ENCOUNTER — Telehealth: Payer: Self-pay

## 2017-12-10 NOTE — Telephone Encounter (Signed)
Copied from [place] [id number]. Topic: General - Other >> Dec 10, 2017 11:54 AM Yvette Rack wrote: Reason for CRM: patient wanting to see if Dr Larose Kells would accept her as a New Patient she states that the practice is closer to her home her PCP for now is Corliss Parish at [facility]

## 2017-12-10 NOTE — Telephone Encounter (Signed)
Please advise 

## 2017-12-10 NOTE — Telephone Encounter (Signed)
Currently unable to take new patients

## 2017-12-10 NOTE — Telephone Encounter (Signed)
[place]- please inform Pt that Dr. Larose Kells is currently not accepting new patients- however- Dr. Nani Ravens, Percell Miller, and I believe Dr. Lorelei Pont are.

## 2017-12-10 NOTE — Telephone Encounter (Signed)
Called pt and left message that Dr. Larose Kells was not accepting new patients however there are other providers accepting new patients. I asked her to call us back and set up a new patient appt with Dr. Nani Ravens, Colt, or Copland.

## 2017-12-26 ENCOUNTER — Encounter: Payer: BC Managed Care – PPO | Admitting: Family Medicine

## 2017-12-26 NOTE — Telephone Encounter (Signed)
NP appt scheduled w/ Dr. Nani Ravens 01/01/2018.

## 2018-01-01 ENCOUNTER — Encounter: Payer: Self-pay | Admitting: Interventional Cardiology

## 2018-01-01 ENCOUNTER — Ambulatory Visit: Payer: BC Managed Care – PPO | Admitting: Family Medicine

## 2018-01-01 ENCOUNTER — Encounter: Payer: Self-pay | Admitting: Family Medicine

## 2018-01-01 VITALS — BP 104/72 | HR 57 | Temp 98.2°F | Ht 64.0 in | Wt 230.2 lb

## 2018-01-01 DIAGNOSIS — I10 Essential (primary) hypertension: Secondary | ICD-10-CM | POA: Diagnosis not present

## 2018-01-01 DIAGNOSIS — G5601 Carpal tunnel syndrome, right upper limb: Secondary | ICD-10-CM | POA: Diagnosis not present

## 2018-01-01 HISTORY — DX: Carpal tunnel syndrome, right upper limb: G56.01

## 2018-01-01 NOTE — Progress Notes (Signed)
Pre visit review using our clinic review tool, if applicable. No additional management support is needed unless otherwise documented below in the visit note. 

## 2018-01-01 NOTE — Progress Notes (Signed)
Chief Complaint  Patient presents with  . Establish Care       New Patient Visit SUBJECTIVE: HPI: Candice [name] is an 57 y.o.female who is being seen for establishing care.  The patient was previously seen at [facility], changed here due to convenience of location.  Hx of irreg heartbeat and HTN. Doing well on current meds, following with cardiology team. No CP or sob.  Hx of gout, doing well on colchicine.  R middle finger numbness that started in Nov, 2018. No injury or change in activity. She initially had issues when she would wake up at night and feel numbness/tingling in most of her hand. It would improve after shaking her hand. Now only the middle R finger feels numb. No weakness.    Allergies  Allergen Reactions  . Ace Inhibitors Nausea And Vomiting  . Allopurinol Nausea Only    Chills, headache, sore throat, sweats. No rash.     Past Medical History:  Diagnosis Date  . Allergy   . Gout    great right toe  . Heart murmur   . Hypertension    Past Surgical History:  Procedure Laterality Date  . OVARIAN CYST REMOVAL Left    20 yrs ago  . TUBAL LIGATION     Social History   Socioeconomic History  . Marital status: Divorced  Tobacco Use  . Smoking status: Never Smoker  . Smokeless tobacco: Never Used  Substance and Sexual Activity  . Alcohol use: No  . Drug use: No  . Sexual activity: Yes    Birth control/protection: Surgical   Family History  Problem Relation Age of Onset  . Stroke Mother   . Heart disease Sister   . Aneurysm Sister   . Colon cancer Neg Hx   . Colon polyps Neg Hx   . Esophageal cancer Neg Hx   . Rectal cancer Neg Hx   . Stomach cancer Neg Hx   . Pancreatic cancer Neg Hx      Current Outpatient Medications:  .  atenolol (TENORMIN) 50 MG tablet, Take 50 mg daily by mouth., Disp: , Rfl: 1 .  colchicine 0.6 MG tablet, Take 1 tablet (0.6 mg total) by mouth daily., Disp: 90 tablet, Rfl: 1 .  cyclobenzaprine (FLEXERIL) 5 MG tablet, Take  5 mg by mouth at bedtime as needed for muscle spasms., Disp: , Rfl:  .  Emollient (EUCERIN) lotion, Apply topically onto the skin as needed for dry skin., Disp: , Rfl:  .  ibuprofen (ADVIL,MOTRIN) 100 MG tablet, Take 100-200 mg by mouth every 6 (six) hours as needed for pain. , Disp: , Rfl:  .  levocetirizine (XYZAL) 5 MG tablet, Take 5 mg by mouth at bedtime as needed for allergies., Disp: , Rfl:  .  losartan-hydrochlorothiazide (HYZAAR) 100-12.5 MG tablet, Take 1 tablet by mouth daily., Disp: 90 tablet, Rfl: 3 .  triamcinolone cream (KENALOG) 0.1 %, Apply 1 application topically 2 (two) times daily as needed (rash)., Disp: , Rfl:    No LMP recorded. Patient is postmenopausal.  ROS Cardiovascular: Denies chest pain  Respiratory: Denies dyspnea   OBJECTIVE:  BP 104/72 (BP Location: Right Arm, Patient Position: Sitting, Cuff Size: Large)   Pulse (!) 57   Temp 98.2 F (36.8 C) (Oral)   Ht 5\' 4"  (1.626 m)   Wt 230 lb 4 oz (104.4 kg)   SpO2 97%   BMI 39.52 kg/m   Constitutional: -  VS reviewed -  Well developed, well nourished,  appears stated age -  No apparent distress  Psychiatric: -  Oriented to person, place, and time -  Memory intact -  Affect and mood normal -  Fluent conversation, good eye contact -  Judgment and insight age appropriate  Eye: -  Conjunctivae clear, no discharge -  Pupils symmetric, round, reactive to light  ENMT: -  MMM    Pharynx moist, no exudate, no erythema  Neck: -  No gross swelling, no palpable masses -  Thyroid midline, not enlarged, mobile, no palpable masses  Cardiovascular: -  RRR -  No LE edema  Respiratory: -  Normal respiratory effort, no accessory muscle use, no retraction -  Breath sounds equal, no wheezes, no ronchi, no crackles  Neurological:  -  CN II - XII grossly intact -  Neg Tinel's and Phalen's -  Sensation intact to pinprick on hands, equal bilaterally  Musculoskeletal: -  No clubbing, no cyanosis -  Gait normal -  Grip  strength adequate  Skin: -  No significant lesion on inspection -  Warm and dry to palpation   ASSESSMENT/PLAN: Carpal tunnel syndrome of right wrist  Essential hypertension  Stretches/exercises, cock up splints. If no improvement, will consider injection vs hand referral.  BP doing well.  Patient should return in 9 mo for CPE. The patient voiced understanding and agreement to the plan.   [place], DO 01/01/18  12:16 PM

## 2018-01-01 NOTE — Patient Instructions (Addendum)
Consider a wrist cock up splint to wear at night to help with symptoms.  Call when you need refills.  Let us know if you need anything.  Hand Exercises Hand exercises can be helpful to almost anyone. These exercises can strengthen the hands, improve flexibility and movement, and increase blood flow to the hands. These results can make work and daily tasks easier. Hand exercises can be especially helpful for people who have joint pain from arthritis or have nerve damage from overuse (carpal tunnel syndrome). These exercises can also help people who have injured a hand. Most of these hand exercises are fairly gentle stretching routines. You can do them often throughout the day. Still, it is a good idea to ask your health care provider which exercises would be best for you. Warming your hands before exercise may help to reduce stiffness. You can do this with gentle massage or by placing your hands in warm water for 15 minutes. Also, make sure you pay attention to your level of hand pain as you begin an exercise routine. Exercises Knuckle Bend Repeat this exercise 5-10 times with each hand. 1. Stand or sit with your arm, hand, and all five fingers pointed straight up. Make sure your wrist is straight. 2. Gently and slowly bend your fingers down and inward until the tips of your fingers are touching the tops of your palm. 3. Hold this position for a few seconds. 4. Extend your fingers out to their original position, all pointing straight up again.  Finger Fan Repeat this exercise 5-10 times with each hand. 1. Hold your arm and hand out in front of you. Keep your wrist straight. 2. Squeeze your hand into a fist. 3. Hold this position for a few seconds. 4. Edison Simon out, or spread apart, your hand and fingers as much as possible, stretching every joint fully.  Tabletop Repeat this exercise 5-10 times with each hand. 1. Stand or sit with your arm, hand, and all five fingers pointed straight up. Make sure  your wrist is straight. 2. Gently and slowly bend your fingers at the knuckles where they meet the hand until your hand is making an upside-down L shape. Your fingers should form a tabletop. 3. Hold this position for a few seconds. 4. Extend your fingers out to their original position, all pointing straight up again.  Making Os Repeat this exercise 5-10 times with each hand. 1. Stand or sit with your arm, hand, and all five fingers pointed straight up. Make sure your wrist is straight. 2. Make an O shape by touching your pointer finger to your thumb. Hold for a few seconds. Then open your hand wide. 3. Repeat this motion with each finger on your hand.  Table Spread Repeat this exercise 5-10 times with each hand. 1. Place your hand on a table with your palm facing down. Make sure your wrist is straight. 2. Spread your fingers out as much as possible. Hold this position for a few seconds. 3. Slide your fingers back together again. Hold for a few seconds.  Ball Grip  Repeat this exercise 10-15 times with each hand. 1. Hold a tennis ball or another soft ball in your hand. 2. While slowly increasing pressure, squeeze the ball as hard as possible. 3. Squeeze as hard as you can for 3-5 seconds. 4. Relax and repeat.  Wrist Curls Repeat this exercise 10-15 times with each hand. 1. Sit in a chair that has armrests. 2. Hold a light weight in your  hand, such as a dumbbell that weighs 1-3 pounds (0.5-1.4 kg). Ask your health care provider what weight would be best for you. 3. Rest your hand just over the end of the chair arm with your palm facing up. 4. Gently pivot your wrist up and down while holding the weight. Do not twist your wrist from side to side.  Contact a health care provider if:  Your hand pain or discomfort gets much worse when you do an exercise.  Your hand pain or discomfort does not improve within 2 hours after you exercise. If you have any of these problems, stop doing these  exercises right away. Do not do them again unless your health care provider says that you can. Get help right away if:  You develop sudden, severe hand pain. If this happens, stop doing these exercises right away. Do not do them again unless your health care provider says that you can. This information is not intended to replace advice given to you by your health care provider. Make sure you discuss any questions you have with your health care provider. Document Released: 10/10/2015 Document Revised: 04/05/2016 Document Reviewed: 05/09/2015 Elsevier Interactive Patient Education  [employer].

## 2018-01-08 ENCOUNTER — Ambulatory Visit: Payer: BC Managed Care – PPO | Admitting: Interventional Cardiology

## 2018-02-04 ENCOUNTER — Other Ambulatory Visit: Payer: Self-pay | Admitting: Family Medicine

## 2018-02-04 DIAGNOSIS — M79671 Pain in right foot: Secondary | ICD-10-CM

## 2018-02-14 ENCOUNTER — Ambulatory Visit: Payer: Self-pay | Admitting: *Deleted

## 2018-02-14 ENCOUNTER — Encounter: Payer: Self-pay | Admitting: Family Medicine

## 2018-02-14 ENCOUNTER — Ambulatory Visit: Payer: BC Managed Care – PPO | Admitting: Family Medicine

## 2018-02-14 VITALS — BP 102/68 | HR 69 | Temp 99.6°F | Ht 64.0 in | Wt 232.2 lb

## 2018-02-14 DIAGNOSIS — J40 Bronchitis, not specified as acute or chronic: Secondary | ICD-10-CM

## 2018-02-14 MED ORDER — METHYLPREDNISOLONE ACETATE 80 MG/ML IJ SUSP
80.0000 mg | Freq: Once | INTRAMUSCULAR | Status: AC
  Administered 2018-02-14: 80 mg via INTRAMUSCULAR

## 2018-02-14 MED ORDER — ALBUTEROL SULFATE 108 (90 BASE) MCG/ACT IN AEPB: 1.0000 | INHALATION_SPRAY | RESPIRATORY_TRACT | 0 refills | Status: DC | PRN

## 2018-02-14 MED ORDER — PREDNISONE 20 MG PO TABS: 40.0000 mg | ORAL_TABLET | Freq: Every day | ORAL | 0 refills | Status: AC

## 2018-02-14 MED ORDER — ATENOLOL 50 MG PO TABS: 50.0000 mg | ORAL_TABLET | Freq: Every day | ORAL | 11 refills | Status: DC

## 2018-02-14 NOTE — Patient Instructions (Signed)
Continue to push fluids, practice good hand hygiene, and cover your mouth if you cough.  If you start having fevers, shaking or shortness of breath, seek immediate care.  Send MyChart message on Sunday/Monday if no better.  Let us know if you need anything.

## 2018-02-14 NOTE — Telephone Encounter (Signed)
Pt called questioning if she can take Theraflu and ibuprofen with prednisone. Instructed Ibuprofen is contraindicated; instructed to consult with her pharmacist. States she will do so.

## 2018-02-14 NOTE — Progress Notes (Signed)
Pre visit review using our clinic review tool, if applicable. No additional management support is needed unless otherwise documented below in the visit note. 

## 2018-02-14 NOTE — Addendum Note (Signed)
Addended by: Sharon Seller B on: 02/14/2018 08:43 AM   Modules accepted: Orders

## 2018-02-14 NOTE — Progress Notes (Signed)
Chief Complaint  Patient presents with  . Nasal Congestion  . Cough    Candice [name] here for URI complaints.  Duration: 5 days  Associated symptoms: rhinorrhea, wheezing and cough Denies: sinus congestion, sinus [name], itchy watery eyes, ear [name], ear drainage, sore throat, shortness of breath, myalgia and fevers Treatment to date: Mucinex, Robitussin Sick contacts: No  ROS:  Const: Denies fevers HEENT: As noted in HPI Lungs: No SOB  Past Medical History:  Diagnosis Date  . Allergy   . Gout    great right toe  . Heart murmur   . Hypertension    Family History  Problem Relation Age of Onset  . Stroke Mother   . Heart disease Sister   . Aneurysm Sister   . Colon cancer Neg Hx   . Colon polyps Neg Hx   . Esophageal cancer Neg Hx   . Rectal cancer Neg Hx   . Stomach cancer Neg Hx   . Pancreatic cancer Neg Hx     BP 102/68 (BP Location: Left Arm, Patient Position: Sitting, Cuff Size: Large)   Pulse 69   Temp 99.6 F (37.6 C) (Oral)   Ht 5\' 4"  (1.626 m)   Wt 232 lb 3 oz (105.3 kg)   SpO2 98%   BMI 39.85 kg/m  General: Awake, alert, appears stated age HEENT: AT, [place], ears patent b/l and TM's neg, nares patent w/o discharge, pharynx pink and without exudates, MMM Neck: No masses or asymmetry Heart: RRR Lungs: CTAB, no accessory muscle use Psych: Age appropriate judgment and insight, normal mood and affect  Wheezy bronchitis - Plan: predniSONE (DELTASONE) 20 MG tablet, Albuterol Sulfate 108 (90 Base) MCG/ACT AEPB  Orders as above. Steroid injection today, start steroids tomorrow. MC message Sun/Mon if no better, will send in abx. Continue to push fluids, practice good hand hygiene, cover mouth when coughing. F/u prn. If starting to experience fevers, shaking, or shortness of breath, seek immediate care. Pt voiced understanding and agreement to the plan.  [place], DO 02/14/18 8:34 AM

## 2018-02-26 ENCOUNTER — Encounter: Payer: Self-pay | Admitting: Medical

## 2018-02-26 ENCOUNTER — Ambulatory Visit (HOSPITAL_BASED_OUTPATIENT_CLINIC_OR_DEPARTMENT_OTHER)
Admission: RE | Admit: 2018-02-26 | Discharge: 2018-02-26 | Disposition: A | Discharge status: Home / Self Care | Payer: BC Managed Care – PPO | Source: Ambulatory Visit | Attending: Medical | Admitting: Medical

## 2018-02-26 ENCOUNTER — Ambulatory Visit: Payer: BC Managed Care – PPO | Admitting: Medical

## 2018-02-26 VITALS — BP 121/74 | HR 64 | Temp 100.6°F | Resp 16 | Ht 64.0 in | Wt 228.2 lb

## 2018-02-26 DIAGNOSIS — R110 Nausea: Secondary | ICD-10-CM

## 2018-02-26 DIAGNOSIS — R062 Wheezing: Secondary | ICD-10-CM | POA: Diagnosis not present

## 2018-02-26 DIAGNOSIS — R5383 Other fatigue: Secondary | ICD-10-CM | POA: Diagnosis not present

## 2018-02-26 DIAGNOSIS — J40 Bronchitis, not specified as acute or chronic: Secondary | ICD-10-CM

## 2018-02-26 DIAGNOSIS — R195 Other fecal abnormalities: Secondary | ICD-10-CM | POA: Diagnosis not present

## 2018-02-26 DIAGNOSIS — R059 Cough, unspecified: Secondary | ICD-10-CM

## 2018-02-26 DIAGNOSIS — R6883 Chills (without fever): Secondary | ICD-10-CM

## 2018-02-26 DIAGNOSIS — J301 Allergic rhinitis due to pollen: Secondary | ICD-10-CM

## 2018-02-26 DIAGNOSIS — R05 Cough: Secondary | ICD-10-CM | POA: Diagnosis present

## 2018-02-26 MED ORDER — FLUTICASONE PROPIONATE 50 MCG/ACT NA SUSP: 2.0000 | Freq: Every day | NASAL | 1 refills | Status: DC

## 2018-02-26 MED ORDER — COLCHICINE 0.6 MG PO TABS: 0.6000 mg | ORAL_TABLET | Freq: Every day | ORAL | 3 refills | Status: DC

## 2018-02-26 MED ORDER — PREDNISONE 10 MG PO TABS: ORAL_TABLET | ORAL | 0 refills | Status: DC

## 2018-02-26 MED ORDER — AZITHROMYCIN 250 MG PO TABS
ORAL_TABLET | ORAL | 0 refills | Status: DC
Start: 2018-02-26 — End: 2018-03-12

## 2018-02-26 MED ORDER — ALBUTEROL SULFATE 108 (90 BASE) MCG/ACT IN AEPB: 1.0000 | INHALATION_SPRAY | RESPIRATORY_TRACT | 0 refills | Status: DC | PRN

## 2018-02-26 MED ORDER — HYDROCODONE-HOMATROPINE 5-1.5 MG/5ML PO SYRP: 5.0000 mL | ORAL_SOLUTION | Freq: Three times a day (TID) | ORAL | 0 refills | Status: DC | PRN

## 2018-02-26 MED ORDER — ONDANSETRON 8 MG PO TBDP: 8.0000 mg | ORAL_TABLET | Freq: Three times a day (TID) | ORAL | 0 refills | Status: DC | PRN

## 2018-02-26 NOTE — Progress Notes (Signed)
Subjective:    Patient ID: Candice [name], female    DOB: [birth year], 59 y.o.   MRN: [id number]  HPI   Pt in for follow up.  She states has been sick for about 3 weeks. Pt states when first got sick she remembers washing car and had a lot pollen exposure. She got nasal congestion, chest congestion and had wheezing. Wheezing was severe.  Wheezing is still present but mild to moderate.  But symptoms are lingering and not resolving.  Cough was dry and now describing a little bit of lproductive cough intermittently.  Pt was given steroid injection and rx of prednisone. Pt states she did not get antibiotic.  Pt states she still has chest congestion.  Pt stats over last 3 days she has been sweating with some cold chills.    Pt states just this morning had one loose stool. Pt feels some nausea. Did vomit one time on Monday.  But no vomiting since.  Not reporting any abdominal [name] presently.     Review of Systems  Constitutional: Positive for chills and fatigue.  HENT: Positive for congestion and postnasal drip. Negative for hearing loss and rhinorrhea.        Nasal congestion and chest congestion.  Worse congestion in his chest.  Respiratory: Positive for cough and wheezing.   Cardiovascular: Negative for chest [name] and palpitations.  Musculoskeletal: Negative for back [name].  Skin: Negative for rash.  Neurological: Negative for dizziness, syncope, facial asymmetry and speech difficulty.  Hematological: Negative for adenopathy. Does not bruise/bleed easily.  Psychiatric/Behavioral: Negative for behavioral problems and confusion.   Past Medical History:  Diagnosis Date  . Allergy   . Gout    great right toe  . Heart murmur   . Hypertension      Social History   Socioeconomic History  . Marital status: Divorced    Spouse name: Not on file  . Number of children: Not on file  . Years of education: Not on file  . Highest education level: Not on file  Occupational History    . Not on file  Social Needs  . Financial resource strain: Not on file  . Food insecurity:    Worry: Not on file    Inability: Not on file  . Transportation needs:    Medical: Not on file    Non-medical: Not on file  Tobacco Use  . Smoking status: Never Smoker  . Smokeless tobacco: Never Used  Substance and Sexual Activity  . Alcohol use: No  . Drug use: No  . Sexual activity: Yes    Birth control/protection: Surgical  Lifestyle  . Physical activity:    Days per week: Not on file    Minutes per session: Not on file  . Stress: Not on file  Relationships  . Social connections:    Talks on phone: Not on file    Gets together: Not on file    Attends religious service: Not on file    Active member of club or organization: Not on file    Attends meetings of clubs or organizations: Not on file    Relationship status: Not on file  . Intimate partner violence:    Fear of current or ex partner: Not on file    Emotionally abused: Not on file    Physically abused: Not on file    Forced sexual activity: Not on file  Other Topics Concern  . Not on file  Social History Narrative  .  Not on file    Past Surgical History:  Procedure Laterality Date  . OVARIAN CYST REMOVAL Left    20 yrs ago  . TUBAL LIGATION      Family History  Problem Relation Age of Onset  . Stroke Mother   . Heart disease Sister   . Aneurysm Sister   . Colon cancer Neg Hx   . Colon polyps Neg Hx   . Esophageal cancer Neg Hx   . Rectal cancer Neg Hx   . Stomach cancer Neg Hx   . Pancreatic cancer Neg Hx     Allergies  Allergen Reactions  . Ace Inhibitors Nausea And Vomiting  . Allopurinol Nausea Only    Chills, headache, sore throat, sweats. No rash.     Current Outpatient Medications on File Prior to Visit  Medication Sig Dispense Refill  . Albuterol Sulfate 108 (90 Base) MCG/ACT AEPB Inhale 1-2 puffs into the lungs every 4 (four) hours as needed (shortness of breath/cough/wheezing). 1 each 0   . atenolol (TENORMIN) 50 MG tablet Take 1 tablet (50 mg total) by mouth daily. 30 tablet 11  . cyclobenzaprine (FLEXERIL) 5 MG tablet Take 5 mg by mouth at bedtime as needed for muscle spasms.    . Emollient (EUCERIN) lotion Apply topically onto the skin as needed for dry skin.    Marland Kitchen ibuprofen (ADVIL,MOTRIN) 100 MG tablet Take 100-200 mg by mouth every 6 (six) hours as needed for [name].     Marland Kitchen levocetirizine (XYZAL) 5 MG tablet Take 5 mg by mouth at bedtime as needed for allergies.    Marland Kitchen losartan-hydrochlorothiazide (HYZAAR) 100-12.5 MG tablet Take 1 tablet by mouth daily. 90 tablet 3  . triamcinolone cream (KENALOG) 0.1 % Apply 1 application topically 2 (two) times daily as needed (rash).     No current facility-administered medications on file prior to visit.     BP 121/74   Pulse 64   Temp (!) 100.6 F (38.1 C) (Oral)   Resp 16   Ht 5\' 4"  (1.626 m)   Wt 228 lb 3.2 oz (103.5 kg)   SpO2 99%   BMI 39.17 kg/m       Objective:   Physical Exam  General  Mental Status - Alert. General Appearance - Well groomed. Not in acute distress.  Skin Rashes- No Rashes.  HEENT Head- Normal. Ear Auditory Canal - Left- Normal. Right - Normal.Tympanic Membrane- Left- Normal. Right- Normal. Eye Sclera/Conjunctiva- Left- Normal. Right- Normal. Nose & Sinuses Nasal Mucosa- Left-  Boggy and Congested. Right-  Boggy and  Congested.Bilateral faint maxillary and  frontal sinus pressure. Mouth & Throat Lips: Upper Lip- Normal: no dryness, cracking, pallor, cyanosis, or vesicular eruption. Lower Lip-Normal: no dryness, cracking, pallor, cyanosis or vesicular eruption. Buccal Mucosa- Bilateral- No Aphthous ulcers. Oropharynx- No Discharge or Erythema. Tonsils: Characteristics- Bilateral- No Erythema. Size/Enlargement- Bilateral- No enlargement. Discharge- bilateral-None.  Neck Neck- Supple. No Masses.   Chest and Lung Exam Auscultation: Breath Sounds:- even and unlabored.  But respiratory sounds  are shallow.  Cardiovascular Auscultation:Rythm- Regular, rate and rhythm. Murmurs & Other Heart Sounds:Ausculatation of the heart reveal- No Murmurs.  Lymphatic Head & Neck General Head & Neck Lymphatics: Bilateral: Description- No Localized lymphadenopathy.       Assessment & Plan:  You do appear to have recent moderate to severe allergic rhinitis with some bronchitis and wheezing.  Symptoms have persisted for about 3 weeks now.  For nasal congestion, prescribe Flonase.  For persisting bronchitis type symptoms,  I prescribed a azithromycin antibiotic.  For severe cough, I prescribed Hycodan cough syrup.  For persistent wheezing, I do think you would benefit from taper dose of prednisone.  Also making albuterol inhaler available.  For recent brief nausea, I am making Zofran available.  Note you have 1 day of mild GI symptoms with one loose stool.  We need to follow this closely.  Recommend eating bland foods and hydrate well.  If loose stool worsenplease let us know.  Unfortunately I feel like giving antibiotic is necessary for bronchitis type symptoms.  Antibiotic side effect is often times loose stool.  But his stools are very frequent and watery and then please let us know.  Follow-up in 7-10 days or as needed.  Please get chest x-ray today to evaluate if you have pneumonia.  Also want to get a CBC and metabolic panel.  Mackie Pai, PA-C

## 2018-02-26 NOTE — Patient Instructions (Addendum)
You do appear to have recent moderate to severe allergic rhinitis with some bronchitis and wheezing.  Symptoms have persisted for about 3 weeks now.  For nasal congestion, prescribe Flonase.  For persisting bronchitis type symptoms, I prescribed a azithromycin antibiotic.  For severe cough, I prescribed Hycodan cough syrup.  For persistent wheezing, I do think you would benefit from taper dose of prednisone.  Also making albuterol inhaler available.  For recent brief nausea, I am making Zofran available.  Note you have 1 day of mild GI symptoms with one loose stool.  We need to follow this closely.  Recommend eating bland foods and hydrate well.  If loose stools worsen please let us know.  Unfortunately I feel like giving antibiotic is necessary for bronchitis type symptoms.  Antibiotic side effect is often times loose stool.  But his stools are very frequent and watery and then please let us know.  Follow-up in 7-10 days or as needed.  Please get chest x-ray today to evaluate if you have pneumonia.  Also want to get a CBC and metabolic panel.

## 2018-02-27 LAB — COMPREHENSIVE METABOLIC PANEL
ALT: 18 U/L (ref 0–35)
AST: 32 U/L (ref 0–37)
Albumin: 3.8 g/dL (ref 3.5–5.2)
Alkaline Phosphatase: 70 U/L (ref 39–117)
BUN: 30 mg/dL — ABNORMAL HIGH (ref 6–23)
CALCIUM: 9 mg/dL (ref 8.4–10.5)
CO2: 23 meq/L (ref 19–32)
Chloride: 96 mEq/L (ref 96–112)
Creatinine, Ser: 1.79 mg/dL — ABNORMAL HIGH (ref 0.40–1.20)
GFR: 37.32 mL/min — AB (ref >60.00)
GLUCOSE: 77 mg/dL (ref 70–99)
Potassium: 4.4 mEq/L (ref 3.5–5.1)
Sodium: 129 mEq/L — ABNORMAL LOW (ref 135–145)
Total Bilirubin: 0.9 mg/dL (ref 0.2–1.2)
Total Protein: 8.4 g/dL — ABNORMAL HIGH (ref 6.0–8.3)

## 2018-02-28 ENCOUNTER — Telehealth: Payer: Self-pay | Admitting: Medical

## 2018-02-28 DIAGNOSIS — R944 Abnormal results of kidney function studies: Secondary | ICD-10-CM

## 2018-02-28 NOTE — Telephone Encounter (Signed)
Future order cmp placed. 

## 2018-03-04 ENCOUNTER — Other Ambulatory Visit (INDEPENDENT_AMBULATORY_CARE_PROVIDER_SITE_OTHER): Payer: BC Managed Care – PPO

## 2018-03-04 DIAGNOSIS — R944 Abnormal results of kidney function studies: Secondary | ICD-10-CM

## 2018-03-04 LAB — COMPREHENSIVE METABOLIC PANEL
ALT: 16 U/L (ref 0–35)
AST: 15 U/L (ref 0–37)
Albumin: 3.2 g/dL — ABNORMAL LOW (ref 3.5–5.2)
Alkaline Phosphatase: 73 U/L (ref 39–117)
BILIRUBIN TOTAL: 0.4 mg/dL (ref 0.2–1.2)
BUN: 17 mg/dL (ref 6–23)
CO2: 30 meq/L (ref 19–32)
Calcium: 9 mg/dL (ref 8.4–10.5)
Chloride: 101 mEq/L (ref 96–112)
Creatinine, Ser: 0.89 mg/dL (ref 0.40–1.20)
GFR: 83.57 mL/min (ref >60.00)
GLUCOSE: 80 mg/dL (ref 70–99)
Potassium: 3.9 mEq/L (ref 3.5–5.1)
SODIUM: 135 meq/L (ref 135–145)
Total Protein: 7.3 g/dL (ref 6.0–8.3)

## 2018-03-04 LAB — CBC WITH DIFFERENTIAL/PLATELET
BASOS PCT: 0.5 % (ref 0.0–3.0)
Basophils Absolute: 0 10*3/uL (ref 0.0–0.1)
EOS PCT: 1.8 % (ref 0.0–5.0)
Eosinophils Absolute: 0.1 10*3/uL (ref 0.0–0.7)
HCT: 30.7 % — ABNORMAL LOW (ref 36.0–46.0)
HEMOGLOBIN: 10.3 g/dL — AB (ref 12.0–15.0)
LYMPHS ABS: 2.5 10*3/uL (ref 0.7–4.0)
Lymphocytes Relative: 31.7 % (ref 12.0–46.0)
MCHC: 33.6 g/dL (ref 30.0–36.0)
MCV: 83.8 fl (ref 78.0–100.0)
MONOS PCT: 5.9 % (ref 3.0–12.0)
Monocytes Absolute: 0.5 10*3/uL (ref 0.1–1.0)
Neutro Abs: 4.7 10*3/uL (ref 1.4–7.7)
Neutrophils Relative %: 60.1 % (ref 43.0–77.0)
Platelets: 323 10*3/uL (ref 150.0–400.0)
RBC: 3.66 Mil/uL — AB (ref 3.87–5.11)
RDW: 15.1 % (ref 11.5–15.5)
WBC: 7.8 10*3/uL (ref 4.0–10.5)

## 2018-03-04 NOTE — Addendum Note (Signed)
Addended by: Caffie Pinto on: 03/04/2018 04:19 PM   Modules accepted: Orders

## 2018-03-06 ENCOUNTER — Encounter: Payer: Self-pay | Admitting: Medical

## 2018-03-06 ENCOUNTER — Encounter: Payer: Self-pay | Admitting: Family Medicine

## 2018-03-06 ENCOUNTER — Telehealth: Payer: Self-pay | Admitting: Medical

## 2018-03-06 DIAGNOSIS — R5383 Other fatigue: Secondary | ICD-10-CM

## 2018-03-06 DIAGNOSIS — D649 Anemia, unspecified: Secondary | ICD-10-CM

## 2018-03-06 MED ORDER — DOXYCYCLINE HYCLATE 100 MG PO TABS: 100.0000 mg | ORAL_TABLET | Freq: Two times a day (BID) | ORAL | 0 refills | Status: DC

## 2018-03-06 MED ORDER — PREDNISONE 10 MG PO TABS: ORAL_TABLET | ORAL | 0 refills | Status: DC

## 2018-03-06 NOTE — Telephone Encounter (Signed)
Patient states that she is still coughing up mucus and wheezing.  She sent me a message by my chart.  Overall is feeling better than the other day when I saw her.  Considered trying to get her in tomorrow to see her on my schedule or Dr. Irene Limbo.  Both schedule looked relatively full.  In addition patient indicated she did not want to come in.  I did discuss with her that I would call in doxycycline for 5 days and give her a 4-day taper dose of prednisone.  I explained to her that if she does not feel much better by Monday then would need to see her Monday afternoon or Tuesday.  Patient agreed with the plan.  Also explained to her that she is anemic and sometime next week I want her to get scheduled for a CBC, iron studies and vitamin D.  Vitamin D was added since she reports some fatigue and she wants to know if she should start a supplement.

## 2018-03-07 ENCOUNTER — Other Ambulatory Visit: Payer: Self-pay | Admitting: Emergency Medicine

## 2018-03-07 DIAGNOSIS — D649 Anemia, unspecified: Secondary | ICD-10-CM

## 2018-03-12 ENCOUNTER — Ambulatory Visit: Payer: BC Managed Care – PPO | Admitting: Medical

## 2018-03-12 ENCOUNTER — Encounter: Payer: Self-pay | Admitting: Medical

## 2018-03-12 VITALS — BP 126/76 | HR 62 | Temp 98.1°F | Resp 16 | Ht 64.0 in | Wt 233.0 lb

## 2018-03-12 DIAGNOSIS — R062 Wheezing: Secondary | ICD-10-CM

## 2018-03-12 DIAGNOSIS — H938X3 Other specified disorders of ear, bilateral: Secondary | ICD-10-CM

## 2018-03-12 DIAGNOSIS — R059 Cough, unspecified: Secondary | ICD-10-CM

## 2018-03-12 DIAGNOSIS — D649 Anemia, unspecified: Secondary | ICD-10-CM

## 2018-03-12 DIAGNOSIS — R05 Cough: Secondary | ICD-10-CM | POA: Diagnosis not present

## 2018-03-12 MED ORDER — HYDROCODONE-HOMATROPINE 5-1.5 MG/5ML PO SYRP: 5.0000 mL | ORAL_SOLUTION | Freq: Four times a day (QID) | ORAL | 0 refills | Status: DC | PRN

## 2018-03-12 MED ORDER — AZELASTINE HCL 0.1 % NA SOLN: 2.0000 | Freq: Two times a day (BID) | NASAL | 1 refills | Status: DC

## 2018-03-12 NOTE — Patient Instructions (Signed)
Majority of all your symptoms have improved significantly except for right ear pressure sensation.  You may have some residual eustachian tube dysfunction/pressure.  I want you to continue Flonase and I am adding Astelin nasal spray.  For persisting intermittent residual cough, I wrote a prescription of Hycodan again.  You have some residual intermittent wheezing, recommend use albuterol as needed.  If severe wheezing occurs please let me know.  No significant abnormal lungs sounds on exam today.  You do have a future chest x-ray order in the computer.  You can get that done if your cough persists for more than another 1 to 2 weeks.  Follow-up as really schedule with PCP or as needed or myself.  Please call back and schedule to get repeat CBC and iron studies within 1 week.

## 2018-03-12 NOTE — Progress Notes (Signed)
Subjective:    Patient ID: Candice [name], female    DOB: 1961/04/23, 57 y.o.   MRN: [id number]  HPI  Pt in states she feels a lot better. The only thing she has presently is rt ear pressure with little bit of ear tube. But all other symptoms feel a lot better. Severe cough is tapering off. Wheezing is much less. No constant wheezing. Only very rare at this point.  Cough is still random and intermittent. Sometimes will get brief cough. Pt never got cxr. No fever,no chills or sweats. She feels well enough now that she states xray not needed  Pt is anemic. Not severe. Lab closed presently. Wanted her to repeat today but lab closed by time I got to her room.   Review of Systems  Constitutional: Negative for chills, fatigue and fever.  HENT: Positive for congestion and ear pain. Negative for postnasal drip, sinus pressure, sinus pain and sore throat.   Respiratory: Positive for cough and wheezing. Negative for chest tightness and shortness of breath.        See hpi.  Rare occasional wheeze now.  Cardiovascular: Negative for chest pain and palpitations.  Gastrointestinal: Negative for abdominal pain.  Musculoskeletal: Negative for back pain and myalgias.  Hematological: Negative for adenopathy.  Psychiatric/Behavioral: Negative for behavioral problems and confusion.    Past Medical History:  Diagnosis Date  . Allergy   . Gout    great right toe  . Heart murmur   . Hypertension      Social History   Socioeconomic History  . Marital status: Divorced    Spouse name: Not on file  . Number of children: Not on file  . Years of education: Not on file  . Highest education level: Not on file  Occupational History  . Not on file  Social Needs  . Financial resource strain: Not on file  . Food insecurity:    Worry: Not on file    Inability: Not on file  . Transportation needs:    Medical: Not on file    Non-medical: Not on file  Tobacco Use  . Smoking status: Never Smoker  .  Smokeless tobacco: Never Used  Substance and Sexual Activity  . Alcohol use: No  . Drug use: No  . Sexual activity: Yes    Birth control/protection: Surgical  Lifestyle  . Physical activity:    Days per week: Not on file    Minutes per session: Not on file  . Stress: Not on file  Relationships  . Social connections:    Talks on phone: Not on file    Gets together: Not on file    Attends religious service: Not on file    Active member of club or organization: Not on file    Attends meetings of clubs or organizations: Not on file    Relationship status: Not on file  . Intimate partner violence:    Fear of current or ex partner: Not on file    Emotionally abused: Not on file    Physically abused: Not on file    Forced sexual activity: Not on file  Other Topics Concern  . Not on file  Social History Narrative  . Not on file    Past Surgical History:  Procedure Laterality Date  . OVARIAN CYST REMOVAL Left    20 yrs ago  . TUBAL LIGATION      Family History  Problem Relation Age of Onset  . Stroke Mother   .  Heart disease Sister   . Aneurysm Sister   . Colon cancer Neg Hx   . Colon polyps Neg Hx   . Esophageal cancer Neg Hx   . Rectal cancer Neg Hx   . Stomach cancer Neg Hx   . Pancreatic cancer Neg Hx     Allergies  Allergen Reactions  . Ace Inhibitors Nausea And Vomiting  . Allopurinol Nausea Only    Chills, headache, sore throat, sweats. No rash.     Current Outpatient Medications on File Prior to Visit  Medication Sig Dispense Refill  . Albuterol Sulfate 108 (90 Base) MCG/ACT AEPB Inhale 1-2 puffs into the lungs every 4 (four) hours as needed (shortness of breath/cough/wheezing). 1 each 0  . atenolol (TENORMIN) 50 MG tablet Take 1 tablet (50 mg total) by mouth daily. 30 tablet 11  . colchicine 0.6 MG tablet Take 1 tablet (0.6 mg total) by mouth daily. 90 tablet 3  . Emollient (EUCERIN) lotion Apply topically onto the skin as needed for dry skin.    .  fluticasone (FLONASE) 50 MCG/ACT nasal spray Place 2 sprays into both nostrils daily. 16 g 1  . ibuprofen (ADVIL,MOTRIN) 100 MG tablet Take 100-200 mg by mouth every 6 (six) hours as needed for pain.     Marland Kitchen levocetirizine (XYZAL) 5 MG tablet Take 5 mg by mouth at bedtime as needed for allergies.    Marland Kitchen losartan-hydrochlorothiazide (HYZAAR) 100-12.5 MG tablet Take 1 tablet by mouth daily. 90 tablet 3  . triamcinolone cream (KENALOG) 0.1 % Apply 1 application topically 2 (two) times daily as needed (rash).     No current facility-administered medications on file prior to visit.     BP 126/76 (BP Location: Left Arm, Patient Position: Sitting, Cuff Size: Large)   Pulse 62   Temp 98.1 F (36.7 C) (Oral)   Resp 16   Ht 5\' 4"  (1.626 m)   Wt 233 lb (105.7 kg)   SpO2 100%   BMI 39.99 kg/m       Objective:   Physical Exam    General  Mental Status - Alert. General Appearance - Well groomed. Not in acute distress.  Skin Rashes- No Rashes.  HEENT Head- Normal. Ear Auditory Canal - Left- Normal. Right - Normal.Tympanic Membrane- Left- Normal. Right- Normal. Eye Sclera/Conjunctiva- Left- Normal. Right- Normal. Nose & Sinuses Nasal Mucosa- Left-  Boggy and Congested. Right-  Boggy and  Congested.Bilatera no l maxillary and no  frontal sinus pressure. Mouth & Throat Lips: Upper Lip- Normal: no dryness, cracking, pallor, cyanosis, or vesicular eruption. Lower Lip-Normal: no dryness, cracking, pallor, cyanosis or vesicular eruption. Buccal Mucosa- Bilateral- No Aphthous ulcers. Oropharynx- No Discharge or Erythema. Tonsils: Characteristics- Bilateral- No Erythema or Congestion. Size/Enlargement- Bilateral- No enlargement. Discharge- bilateral-None.  Neck Neck- Supple. No Masses.   Chest and Lung Exam Auscultation: Breath Sounds:-Clear even and unlabored.  Cardiovascular Auscultation:Rythm- Regular, rate and rhythm. Murmurs & Other Heart Sounds:Ausculatation of the heart reveal- No  Murmurs.  Lymphatic Head & Neck General Head & Neck Lymphatics: Bilateral: Description- No Localized lymphadenopathy.     Assessment & Plan:  Majority of all your symptoms have improved significantly except for right ear pressure sensation.  You may have some residual eustachian tube dysfunction/pressure.  I want you to continue Flonase and I am adding Astelin nasal spray.  For persisting intermittent residual cough, I wrote a prescription of Hycodan again.  You have some residual intermittent wheezing, recommend use albuterol as needed.  If severe wheezing  occurs please let me know.  No significant abnormal lungs sounds on exam today.  You do have a future chest x-ray order in the computer.  You can get that done if your cough persists for more than another 1 to 2 weeks.  Follow-up as really schedule with PCP or as needed or myself.  Please call back and schedule to get repeat CBC and iron studies within 1 week.  Ramon Dredge Reilyn Nelson, PA-C

## 2018-03-20 ENCOUNTER — Other Ambulatory Visit: Payer: Self-pay | Admitting: Medical

## 2018-03-26 ENCOUNTER — Ambulatory Visit (INDEPENDENT_AMBULATORY_CARE_PROVIDER_SITE_OTHER): Payer: BC Managed Care – PPO | Admitting: Interventional Cardiology

## 2018-03-26 ENCOUNTER — Encounter: Payer: Self-pay | Admitting: Interventional Cardiology

## 2018-03-26 VITALS — BP 132/78 | HR 57 | Ht 64.0 in | Wt 233.4 lb

## 2018-03-26 DIAGNOSIS — R9431 Abnormal electrocardiogram [ECG] [EKG]: Secondary | ICD-10-CM | POA: Diagnosis not present

## 2018-03-26 DIAGNOSIS — I119 Hypertensive heart disease without heart failure: Secondary | ICD-10-CM | POA: Diagnosis not present

## 2018-03-26 DIAGNOSIS — I10 Essential (primary) hypertension: Secondary | ICD-10-CM | POA: Diagnosis not present

## 2018-03-26 NOTE — Progress Notes (Signed)
Cardiology Office Note    Date:  03/26/2018   ID:  Candice [name], DOB 02/19/1961, MRN [id number]  PCP:  Shelda Pal, DO  Cardiologist: Sinclair Grooms, MD   Chief Complaint  Patient presents with  . Hypertension    History of Present Illness:  Candice [name] is a 57 y.o. female who has both echo and EKG documented left ventricular hypertrophy in the setting of essential hypertension.  Currently on medical therapy as previously prescribed.  No side effects.  Overall she feels well.  Past Medical History:  Diagnosis Date  . Allergy   . Gout    great right toe  . Heart murmur   . Hypertension     Past Surgical History:  Procedure Laterality Date  . OVARIAN CYST REMOVAL Left    20 yrs ago  . TUBAL LIGATION      Current Medications: Outpatient Medications Prior to Visit  Medication Sig Dispense Refill  . atenolol (TENORMIN) 50 MG tablet Take 1 tablet (50 mg total) by mouth daily. 30 tablet 11  . colchicine 0.6 MG tablet Take 1 tablet (0.6 mg total) by mouth daily. 90 tablet 3  . Emollient (EUCERIN) lotion Apply topically onto the skin as needed for dry skin.    Marland Kitchen ibuprofen (ADVIL,MOTRIN) 100 MG tablet Take 100-200 mg by mouth every 6 (six) hours as needed for pain.     Marland Kitchen levocetirizine (XYZAL) 5 MG tablet Take 5 mg by mouth at bedtime as needed for allergies.    Marland Kitchen losartan-hydrochlorothiazide (HYZAAR) 100-12.5 MG tablet Take 1 tablet by mouth daily. 90 tablet 3  . triamcinolone cream (KENALOG) 0.1 % Apply 1 application topically 2 (two) times daily as needed (rash).    . Albuterol Sulfate 108 (90 Base) MCG/ACT AEPB Inhale 1-2 puffs into the lungs every 4 (four) hours as needed (shortness of breath/cough/wheezing). (Patient not taking: Reported on 03/26/2018) 1 each 0  . azelastine (ASTELIN) 0.1 % nasal spray Place 2 sprays into both nostrils 2 (two) times daily. Use in each nostril as directed (Patient not taking: Reported on 03/26/2018) 30 mL 1  .  fluticasone (FLONASE) 50 MCG/ACT nasal spray SPRAY 2 SPRAYS INTO EACH NOSTRIL EVERY DAY (Patient not taking: Reported on 03/26/2018) 16 g 0  . HYDROcodone-homatropine (HYCODAN) 5-1.5 MG/5ML syrup Take 5 mLs by mouth every 6 (six) hours as needed for cough. (Patient not taking: Reported on 03/26/2018) 100 mL 0   No facility-administered medications prior to visit.      Allergies:   Ace inhibitors and Allopurinol   Social History   Socioeconomic History  . Marital status: Divorced    Spouse name: Not on file  . Number of children: Not on file  . Years of education: Not on file  . Highest education level: Not on file  Occupational History  . Not on file  Social Needs  . Financial resource strain: Not on file  . Food insecurity:    Worry: Not on file    Inability: Not on file  . Transportation needs:    Medical: Not on file    Non-medical: Not on file  Tobacco Use  . Smoking status: Never Smoker  . Smokeless tobacco: Never Used  Substance and Sexual Activity  . Alcohol use: No  . Drug use: No  . Sexual activity: Yes    Birth control/protection: Surgical  Lifestyle  . Physical activity:    Days per week: Not on file  Minutes per session: Not on file  . Stress: Not on file  Relationships  . Social connections:    Talks on phone: Not on file    Gets together: Not on file    Attends religious service: Not on file    Active member of club or organization: Not on file    Attends meetings of clubs or organizations: Not on file    Relationship status: Not on file  Other Topics Concern  . Not on file  Social History Narrative  . Not on file     Family History:  The patient's family history includes Aneurysm in her sister; Heart disease in her sister; Stroke in her mother.   ROS:   Please see the history of present illness.    Feels comfortable with her new primary physician, Dr. Nani Ravens. All other systems reviewed and are negative.   PHYSICAL EXAM:   VS:  BP 132/78    Pulse (!) 57   Ht 5\' 4"  (1.626 m)   Wt 233 lb 6.4 oz (105.9 kg)   BMI 40.06 kg/m     Left arm BP 118/78 mmHg  GEN: Well nourished, well developed, in no acute distress  HEENT: normal  Neck: no JVD, carotid bruits, or masses Cardiac: RRR; no murmurs, rubs, or gallops,no edema  Respiratory:  clear to auscultation bilaterally, normal work of breathing GI: soft, nontender, nondistended, + BS MS: no deformity or atrophy  Skin: warm and dry, no rash Neuro:  Alert and Oriented x 3, Strength and sensation are intact Psych: euthymic mood, full affect  Wt Readings from Last 3 Encounters:  03/26/18 233 lb 6.4 oz (105.9 kg)  03/12/18 233 lb (105.7 kg)  02/26/18 228 lb 3.2 oz (103.5 kg)      Studies/Labs Reviewed:   EKG:  EKG  None  Recent Labs: 03/04/2018: ALT 16; BUN 17; Creatinine, Ser 0.89; Hemoglobin 10.3; Platelets 323.0; Potassium 3.9; Sodium 135   Lipid Panel    Component Value Date/Time   CHOL 134 08/12/2017 0949   TRIG 50 08/12/2017 0949   HDL 65 08/12/2017 0949   CHOLHDL 2.1 08/12/2017 0949   CHOLHDL 2.3 02/24/2016 1010   VLDL 13 02/24/2016 1010   LDLCALC 59 08/12/2017 0949    Additional studies/ records that were reviewed today include:  No new data    ASSESSMENT:    1. Essential hypertension   2. Hypertensive left ventricular hypertrophy, without heart failure   3. Abnormal EKG      PLAN:  In order of problems listed above:  1. Controlled to target of 130/80 mmHg or less.  Low-salt diet, weight loss, aerobic exercise, and medication compliance were discussed in detail.  She does have hypertensive heart disease with LVH.  We discussed the importance of control to prevent progression.  Probably needs a repeat echocardiogram in a year or so.  Otherwise no additional recommendations.  Clinical follow-up as required.  Will return care to Dr. Nani Ravens.    Medication Adjustments/Labs and Tests Ordered: Current medicines are reviewed at length with the  patient today.  Concerns regarding medicines are outlined above.  Medication changes, Labs and Tests ordered today are listed in the Patient Instructions below. Patient Instructions  Medication Instructions:  Your physician recommends that you continue on your current medications as directed. Please refer to the Current Medication list given to you today.  Labwork: None  Testing/Procedures: None  Follow-Up: Your physician recommends that you schedule a follow-up appointment as needed with Dr.  Tamala Julian.    Any Other Special Instructions Will Be Listed Below (If Applicable).     If you need a refill on your cardiac medications before your next appointment, please call your pharmacy.      Signed, Sinclair Grooms, MD  03/26/2018 4:05 PM    [facility] [address], [place], [place]  [address] Phone: [phone]; Fax: [phone]

## 2018-03-26 NOTE — Patient Instructions (Signed)
Medication Instructions:  Your physician recommends that you continue on your current medications as directed. Please refer to the Current Medication list given to you today.  Labwork: None  Testing/Procedures: None  Follow-Up: Your physician recommends that you schedule a follow-up appointment as needed with Dr. Smith.     Any Other Special Instructions Will Be Listed Below (If Applicable).     If you need a refill on your cardiac medications before your next appointment, please call your pharmacy.   

## 2018-04-03 ENCOUNTER — Other Ambulatory Visit: Payer: Self-pay | Admitting: Medical

## 2018-04-28 ENCOUNTER — Telehealth: Payer: Self-pay | Admitting: Medical

## 2018-04-28 ENCOUNTER — Other Ambulatory Visit (INDEPENDENT_AMBULATORY_CARE_PROVIDER_SITE_OTHER): Payer: BC Managed Care – PPO

## 2018-04-28 DIAGNOSIS — R5383 Other fatigue: Secondary | ICD-10-CM | POA: Diagnosis not present

## 2018-04-28 DIAGNOSIS — D649 Anemia, unspecified: Secondary | ICD-10-CM | POA: Diagnosis not present

## 2018-04-28 LAB — CBC WITH DIFFERENTIAL/PLATELET
BASOS ABS: 0 10*3/uL (ref 0.0–0.1)
Basophils Relative: 0.5 % (ref 0.0–3.0)
Eosinophils Absolute: 0.1 10*3/uL (ref 0.0–0.7)
Eosinophils Relative: 3.1 % (ref 0.0–5.0)
HCT: 35.1 % — ABNORMAL LOW (ref 36.0–46.0)
Hemoglobin: 12 g/dL (ref 12.0–15.0)
LYMPHS ABS: 1.7 10*3/uL (ref 0.7–4.0)
LYMPHS PCT: 51.3 % — AB (ref 12.0–46.0)
MCHC: 34.2 g/dL (ref 30.0–36.0)
MCV: 85.6 fl (ref 78.0–100.0)
MONOS PCT: 9.9 % (ref 3.0–12.0)
Monocytes Absolute: 0.3 10*3/uL (ref 0.1–1.0)
NEUTROS PCT: 35.2 % — AB (ref 43.0–77.0)
Neutro Abs: 1.2 10*3/uL — ABNORMAL LOW (ref 1.4–7.7)
Platelets: 208 10*3/uL (ref 150.0–400.0)
RBC: 4.1 Mil/uL (ref 3.87–5.11)
RDW: 16.2 % — ABNORMAL HIGH (ref 11.5–15.5)
WBC: 3.4 10*3/uL — ABNORMAL LOW (ref 4.0–10.5)

## 2018-04-28 LAB — IBC PANEL
Iron: 64 ug/dL (ref 42–145)
SATURATION RATIOS: 22.5 % (ref 20.0–50.0)
TRANSFERRIN: 203 mg/dL — AB (ref 212.0–360.0)

## 2018-04-28 LAB — VITAMIN D 25 HYDROXY (VIT D DEFICIENCY, FRACTURES): VITD: 19.55 ng/mL — ABNORMAL LOW (ref 30.00–100.00)

## 2018-04-28 MED ORDER — VITAMIN D (ERGOCALCIFEROL) 1.25 MG (50000 UNIT) PO CAPS: 50000.0000 [IU] | ORAL_CAPSULE | ORAL | 0 refills | Status: DC

## 2018-04-28 NOTE — Addendum Note (Signed)
Addended by: Caffie Pinto on: 04/28/2018 07:43 AM   Modules accepted: Orders

## 2018-04-28 NOTE — Telephone Encounter (Signed)
Vitamin D rx sent to pt pharmacy.

## 2018-04-29 ENCOUNTER — Encounter: Payer: Self-pay | Admitting: Medical

## 2018-05-12 ENCOUNTER — Other Ambulatory Visit: Payer: Self-pay | Admitting: Medical

## 2018-05-13 ENCOUNTER — Other Ambulatory Visit: Payer: Self-pay | Admitting: Medical

## 2018-09-30 ENCOUNTER — Ambulatory Visit: Payer: BC Managed Care – PPO | Admitting: Family Medicine

## 2018-09-30 ENCOUNTER — Encounter: Payer: Self-pay | Admitting: Family Medicine

## 2018-09-30 DIAGNOSIS — J0100 Acute maxillary sinusitis, unspecified: Secondary | ICD-10-CM | POA: Insufficient documentation

## 2018-09-30 HISTORY — DX: Acute maxillary sinusitis, unspecified: J01.00

## 2018-09-30 MED ORDER — BENZONATATE 100 MG PO CAPS: 100.0000 mg | ORAL_CAPSULE | Freq: Three times a day (TID) | ORAL | 0 refills | Status: DC | PRN

## 2018-09-30 MED ORDER — AMOXICILLIN-POT CLAVULANATE 875-125 MG PO TABS: 1.0000 | ORAL_TABLET | Freq: Two times a day (BID) | ORAL | 0 refills | Status: DC

## 2018-09-30 NOTE — Patient Instructions (Signed)

## 2018-09-30 NOTE — Progress Notes (Signed)
Candice [name] - 57 y.o. female MRN [id number]  Date of birth: Mar 26, 1961  Subjective Chief Complaint  Patient presents with  . Nasal Congestion    last wednesday   . Cough  . Sinus Problem    hot/ chills.     HPI  Candice [name] is a 57 y.o. female here today for acute visit with complaint of cough, nasal congestion and sinus pain.  She reports symptoms began about 1 week ago.  Her symptoms improved over the weekend however returned again yesterday worse than they were initially.  She denies fever but has felt some chills and sweats.  She denies shortness of breath, wheezing, nausea, vomiting, diarrhea or myalgias.  She has been taking OTC theraflu with decongestant as well as OTC medication similar to coricidin.   ROS:  A comprehensive ROS was completed and negative except as noted per HPI  Allergies  Allergen Reactions  . Ace Inhibitors Nausea And Vomiting  . Allopurinol Nausea Only    Chills, headache, sore throat, sweats. No rash.     Past Medical History:  Diagnosis Date  . Allergy   . Gout    great right toe  . Heart murmur   . Hypertension     Past Surgical History:  Procedure Laterality Date  . OVARIAN CYST REMOVAL Left    20 yrs ago  . TUBAL LIGATION      Social History   Socioeconomic History  . Marital status: Divorced    Spouse name: Not on file  . Number of children: Not on file  . Years of education: Not on file  . Highest education level: Not on file  Occupational History  . Not on file  Social Needs  . Financial resource strain: Not on file  . Food insecurity:    Worry: Not on file    Inability: Not on file  . Transportation needs:    Medical: Not on file    Non-medical: Not on file  Tobacco Use  . Smoking status: Never Smoker  . Smokeless tobacco: Never Used  Substance and Sexual Activity  . Alcohol use: No  . Drug use: No  . Sexual activity: Yes    Birth control/protection: Surgical  Lifestyle  . Physical activity:    Days per  week: Not on file    Minutes per session: Not on file  . Stress: Not on file  Relationships  . Social connections:    Talks on phone: Not on file    Gets together: Not on file    Attends religious service: Not on file    Active member of club or organization: Not on file    Attends meetings of clubs or organizations: Not on file    Relationship status: Not on file  Other Topics Concern  . Not on file  Social History Narrative  . Not on file    Family History  Problem Relation Age of Onset  . Stroke Mother   . Heart disease Sister   . Aneurysm Sister   . Colon cancer Neg Hx   . Colon polyps Neg Hx   . Esophageal cancer Neg Hx   . Rectal cancer Neg Hx   . Stomach cancer Neg Hx   . Pancreatic cancer Neg Hx     Health Maintenance  Topic Date Due  . INFLUENZA VACCINE  06/12/2018  . PAP SMEAR  02/23/2019  . MAMMOGRAM  08/13/2019  . TETANUS/TDAP  02/22/2026  . COLONOSCOPY  10/15/2027  .  Hepatitis C Screening  Completed  . HIV Screening  Completed    ----------------------------------------------------------------------------------------------------------------------------------------------------------------------------------------------------------------- Physical Exam There were no vitals taken for this visit.  Physical Exam  Constitutional: She is oriented to person, place, and time. She appears well-nourished. No distress.  HENT:  Head: Normocephalic and atraumatic.  Mouth/Throat: Oropharynx is clear and moist.  Mild sinus ttp bilateral maxillary sinuses.   Eyes: No scleral icterus.  Neck: Neck supple. No thyromegaly present.  Cardiovascular: Normal rate, regular rhythm and normal heart sounds.  Pulmonary/Chest: Effort normal and breath sounds normal.  Lymphadenopathy:    She has no cervical adenopathy.  Neurological: She is alert and oriented to person, place, and time.  Skin: Skin is warm and dry.  Psychiatric: She has a normal mood and affect. Her behavior is  normal.    ------------------------------------------------------------------------------------------------------------------------------------------------------------------------------------------------------------------- Assessment and Plan  Acute maxillary sinusitis -Will cover with antibiotics for likely bacterial infection given initial improvement, then worsening.  -Tessalon perls for cough -Push fluids, may continue supportive care.  Discussed avoiding decongestants as her BP is elevated today.

## 2018-09-30 NOTE — Assessment & Plan Note (Signed)
-  Will cover with antibiotics for likely bacterial infection given initial improvement, then worsening.  -Tessalon perls for cough -Push fluids, may continue supportive care.  Discussed avoiding decongestants as her BP is elevated today.

## 2018-10-01 ENCOUNTER — Telehealth: Payer: Self-pay | Admitting: Family Medicine

## 2018-10-01 ENCOUNTER — Encounter: Payer: BC Managed Care – PPO | Admitting: Family Medicine

## 2018-10-01 NOTE — Telephone Encounter (Signed)
Copied from [id number]. Topic: Appointment Scheduling - Scheduling Inquiry for Clinic >> Sep 30, 2018  9:04 AM [name], NT wrote: Reason for CRM: Pt states her physical for tomorrow was cancelled in error and that she spoke with someone on Friday and they stated they put her back on for tomorrow at 8am. The appt is not scheduled. Pt is wanting to know if she can be worked back in, or be seen before December for her physical. Please advise.   Called the patient and she is scheduled already for 10/23/2018 and will keep that appt.

## 2018-10-01 NOTE — Telephone Encounter (Signed)
Could probably come at 2:00 on 11/21. Today at 1015, 1145 any day this week. 1245 any day this week. Does not need to be fasting, just no fried foods. TY.

## 2018-10-03 ENCOUNTER — Ambulatory Visit (INDEPENDENT_AMBULATORY_CARE_PROVIDER_SITE_OTHER): Payer: BC Managed Care – PPO

## 2018-10-03 DIAGNOSIS — Z23 Encounter for immunization: Secondary | ICD-10-CM | POA: Diagnosis not present

## 2018-10-10 ENCOUNTER — Encounter: Payer: Self-pay | Admitting: Family Medicine

## 2018-10-13 ENCOUNTER — Other Ambulatory Visit: Payer: Self-pay | Admitting: Family Medicine

## 2018-10-13 MED ORDER — FLUCONAZOLE 150 MG PO TABS: 150.0000 mg | ORAL_TABLET | Freq: Once | ORAL | 0 refills | Status: AC

## 2018-10-23 ENCOUNTER — Ambulatory Visit (INDEPENDENT_AMBULATORY_CARE_PROVIDER_SITE_OTHER): Payer: BC Managed Care – PPO | Admitting: Family Medicine

## 2018-10-23 ENCOUNTER — Encounter: Payer: Self-pay | Admitting: Family Medicine

## 2018-10-23 VITALS — BP 130/92 | HR 74 | Temp 98.7°F | Ht 64.0 in | Wt 242.0 lb

## 2018-10-23 DIAGNOSIS — L989 Disorder of the skin and subcutaneous tissue, unspecified: Secondary | ICD-10-CM | POA: Diagnosis not present

## 2018-10-23 DIAGNOSIS — I10 Essential (primary) hypertension: Secondary | ICD-10-CM

## 2018-10-23 DIAGNOSIS — Z1239 Encounter for other screening for malignant neoplasm of breast: Secondary | ICD-10-CM | POA: Diagnosis not present

## 2018-10-23 DIAGNOSIS — Z0000 Encounter for general adult medical examination without abnormal findings: Secondary | ICD-10-CM | POA: Diagnosis not present

## 2018-10-23 MED ORDER — VALSARTAN 320 MG PO TABS: 320.0000 mg | ORAL_TABLET | Freq: Every day | ORAL | 2 refills | Status: DC

## 2018-10-23 MED ORDER — LOSARTAN POTASSIUM 100 MG PO TABS: 100.0000 mg | ORAL_TABLET | Freq: Every day | ORAL | 3 refills | Status: DC

## 2018-10-23 NOTE — Progress Notes (Signed)
Pre visit review using our clinic review tool, if applicable. No additional management support is needed unless otherwise documented below in the visit note. 

## 2018-10-23 NOTE — Patient Instructions (Addendum)
Give [place] 2-3 business days to get the results of your labs back.   If you do not hear anything about your referral in the next 1-2 weeks, call our office and ask for an update.  Keep the diet clean and stay active.  Aim to do some physical exertion for 150 minutes per week. This is typically divided into 5 days per week, 30 minutes per day. The activity should be enough to get your heart rate up. Anything is better than nothing if you have time constraints.  Goal wt: 238 lbs  Healthy Eating Plan Many factors influence your heart health, including eating and exercise habits. Heart (coronary) risk increases with abnormal blood fat (lipid) levels. Heart-healthy meal planning includes limiting unhealthy fats, increasing healthy fats, and making other small dietary changes. This includes maintaining a healthy body weight to help keep lipid levels within a normal range.  WHAT IS MY PLAN?  Your health care provider recommends that you:  Drink a glass of water before meals to help with satiety.  Eat slowly.  An alternative to the water is to add Metamucil. This will help with satiety as well. It does contain calories, unlike water.  WHAT TYPES OF FAT SHOULD I CHOOSE?  Choose healthy fats more often. Choose monounsaturated and polyunsaturated fats, such as olive oil and canola oil, flaxseeds, walnuts, almonds, and seeds.  Eat more omega-3 fats. Good choices include salmon, mackerel, sardines, tuna, flaxseed oil, and ground flaxseeds. Aim to eat fish at least two times each week.  Avoid foods with partially hydrogenated oils in them. These contain trans fats. Examples of foods that contain trans fats are stick margarine, some tub margarines, cookies, crackers, and other baked goods. If you are going to avoid a fat, this is the one to avoid!  WHAT GENERAL GUIDELINES DO I NEED TO FOLLOW?  Check food labels carefully to identify foods with trans fats. Avoid these types of options when  possible.  Fill one half of your plate with vegetables and green salads. Eat 4-5 servings of vegetables per day. A serving of vegetables equals 1 cup of raw leafy vegetables,  cup of raw or cooked cut-up vegetables, or  cup of vegetable juice.  Fill one fourth of your plate with whole grains. Look for the word "whole" as the first word in the ingredient list.  Fill one fourth of your plate with lean protein foods.  Eat 4-5 servings of fruit per day. A serving of fruit equals one medium whole fruit,  cup of dried fruit,  cup of fresh, frozen, or canned fruit. Try to avoid fruits in cups/syrups as the sugar content can be high.  Eat more foods that contain soluble fiber. Examples of foods that contain this type of fiber are apples, broccoli, carrots, beans, peas, and barley. Aim to get 20-30 g of fiber per day.  Eat more home-cooked food and less restaurant, buffet, and fast food.  Limit or avoid alcohol.  Limit foods that are high in starch and sugar.  Avoid fried foods when able.  Cook foods by using methods other than frying. Baking, boiling, grilling, and broiling are all great options. Other fat-reducing suggestions include: ? Removing the skin from poultry. ? Removing all visible fats from meats. ? Skimming the fat off of stews, soups, and gravies before serving them. ? Steaming vegetables in water or broth.  Lose weight if you are overweight. Losing just 5-10% of your initial body weight can help your overall health and  prevent diseases such as diabetes and heart disease.  Increase your consumption of nuts, legumes, and seeds to 4-5 servings per week. One serving of dried beans or legumes equals  cup after being cooked, one serving of nuts equals 1 ounces, and one serving of seeds equals  ounce or 1 tablespoon.  WHAT ARE GOOD FOODS CAN I EAT? Grains Grainy breads (try to find bread that is 3 g of fiber per slice or greater), oatmeal, light popcorn. Whole-grain cereals.  Rice and pasta, including brown rice and those that are made with whole wheat. Edamame pasta is a great alternative to grain pasta. It has a higher protein content. Try to avoid significant consumption of white bread, sugary cereals, or pastries/baked goods.  Vegetables All vegetables. Cooked white potatoes do not count as vegetables.  Fruits All fruits, but limit pineapple and bananas as these fruits have a higher sugar content.  Meats and Other Protein Sources Lean, well-trimmed beef, veal, pork, and lamb. Chicken and [place] without skin. All fish and shellfish. Wild duck, rabbit, pheasant, and venison. Egg whites or low-cholesterol egg substitutes. Dried beans, peas, lentils, and tofu.Seeds and most nuts.  Dairy Low-fat or nonfat cheeses, including ricotta, string, and mozzarella. Skim or 1% milk that is liquid, powdered, or evaporated. Buttermilk that is made with low-fat milk. Nonfat or low-fat yogurt. Soy/Almond milk are good alternatives if you cannot handle dairy.  Beverages Water is the best for you. Sports drinks with less sugar are more desirable unless you are a highly active athlete.  Sweets and Desserts Sherbets and fruit ices. Honey, jam, marmalade, jelly, and syrups. Dark chocolate.  Eat all sweets and desserts in moderation.  Fats and Oils Nonhydrogenated (trans-free) margarines. Vegetable oils, including soybean, sesame, sunflower, olive, peanut, safflower, corn, canola, and cottonseed. Salad dressings or mayonnaise that are made with a vegetable oil. Limit added fats and oils that you use for cooking, baking, salads, and as spreads.  Other Cocoa powder. Coffee and tea. Most condiments.  The items listed above may not be a complete list of recommended foods or beverages. Contact your dietitian for more options.

## 2018-10-23 NOTE — Progress Notes (Signed)
Chief Complaint  Patient presents with  . Annual Exam     Well Woman Candice [name] is here for a complete physical.   Her last physical was >1 year ago.  Current diet: in general, a "not good" diet. Current exercise: none recently. Weight is increasing and she denies daytime fatigue. No LMP recorded. Patient is postmenopausal..  Seatbelt? Yes  Health Maintenance Pap/HPV- Yes Mammogram- Yes Tetanus- Yes Hep C screening- Yes HIV screening- Yes  Past Medical History:  Diagnosis Date  . Allergy   . Gout    great right toe  . Heart murmur   . Hypertension      Past Surgical History:  Procedure Laterality Date  . OVARIAN CYST REMOVAL Left    20 yrs ago  . TUBAL LIGATION      Medications  Current Outpatient Medications on File Prior to Visit  Medication Sig Dispense Refill  . colchicine 0.6 MG tablet Take 1 tablet (0.6 mg total) by mouth daily. 90 tablet 3  . Emollient (EUCERIN) lotion Apply topically onto the skin as needed for dry skin.    Marland Kitchen levocetirizine (XYZAL) 5 MG tablet Take 5 mg by mouth at bedtime as needed for allergies.    Marland Kitchen losartan-hydrochlorothiazide (HYZAAR) 100-12.5 MG tablet Take 1 tablet by mouth daily. 90 tablet 3  . triamcinolone cream (KENALOG) 0.1 % Apply 1 application topically 2 (two) times daily as needed (rash).    Marland Kitchen atenolol (TENORMIN) 50 MG tablet Take 1 tablet (50 mg total) by mouth daily. 30 tablet 11   Allergies Allergies  Allergen Reactions  . Ace Inhibitors Nausea And Vomiting  . Allopurinol Nausea Only    Chills, headache, sore throat, sweats. No rash.     Review of Systems: Constitutional:  no unexpected weight changes Eye:  no recent significant change in vision Ear/Nose/Mouth/Throat:  Ears:  no tinnitus or vertigo and no recent change in hearing Nose/Mouth/Throat:  no complaints of nasal congestion, no sore throat Cardiovascular: no chest pain Respiratory:  no cough and no shortness of breath Gastrointestinal:  no  abdominal pain, no change in bowel habits GU:  Female: negative for dysuria or pelvic pain Musculoskeletal/Extremities:  no pain of the joints Integumentary (Skin/Breast): +lesions in ears; otherwise no abnormal skin lesions reported Neurologic:  no headaches Endocrine:  denies fatigue Hematologic/Lymphatic:  No areas of easy bleeding  Exam BP (!) 130/92 (BP Location: Left Arm, Patient Position: Sitting, Cuff Size: Large)   Pulse 74   Temp 98.7 F (37.1 C) (Oral)   Ht 5\' 4"  (1.626 m)   Wt 242 lb (109.8 kg)   SpO2 94%   BMI 41.54 kg/m  General:  well developed, well nourished, in no apparent distress Skin: See below; otherwise no significant moles, warts, or growths Head:  no masses, lesions, or tenderness Eyes:  pupils equal and round, sclera anicteric without injection Ears:  canals without lesions, TMs shiny without retraction, no obvious effusion, no erythema Nose:  nares patent, septum midline, mucosa normal, and no drainage or sinus tenderness Throat/Pharynx:  lips and gingiva without lesion; tongue and uvula midline; non-inflamed pharynx; no exudates or postnasal drainage Neck: neck supple without adenopathy, thyromegaly, or masses Lungs:  clear to auscultation, breath sounds equal bilaterally, no respiratory distress Cardio:  regular rate and rhythm, no bruits, no LE edema Abdomen:  abdomen soft, nontender; bowel sounds normal; no masses or organomegaly Genital: Defer to GYN Musculoskeletal:  symmetrical muscle groups noted without atrophy or deformity Extremities:  no  clubbing, cyanosis, or edema, no deformities, no skin discoloration Neuro:  gait normal; deep tendon reflexes normal and symmetric Psych: well oriented with normal range of affect and appropriate judgment/insight          Assessment and Plan  Well adult exam - Plan: Comprehensive metabolic panel, Lipid panel  Essential hypertension  Screening for breast cancer - Plan: MM DIGITAL SCREENING  BILATERAL  Skin lesion - Plan: Ambulatory referral to Dermatology   Well 57 y.o. female. Counseled on diet and exercise. Change Losartan to Valsartan per pt anxiety given recall.  Other orders as above. Follow up in 6 weeks. The patient voiced understanding and agreement to the plan.  [place], DO 10/23/18 4:46 PM

## 2018-10-29 ENCOUNTER — Other Ambulatory Visit: Payer: Self-pay | Admitting: Family Medicine

## 2018-10-30 ENCOUNTER — Other Ambulatory Visit (INDEPENDENT_AMBULATORY_CARE_PROVIDER_SITE_OTHER): Payer: BC Managed Care – PPO

## 2018-10-30 ENCOUNTER — Ambulatory Visit (HOSPITAL_BASED_OUTPATIENT_CLINIC_OR_DEPARTMENT_OTHER)
Admission: RE | Admit: 2018-10-30 | Discharge: 2018-10-30 | Disposition: A | Discharge status: Home / Self Care | Payer: BC Managed Care – PPO | Source: Ambulatory Visit | Attending: Family Medicine | Admitting: Family Medicine

## 2018-10-30 DIAGNOSIS — Z1239 Encounter for other screening for malignant neoplasm of breast: Secondary | ICD-10-CM | POA: Diagnosis not present

## 2018-10-30 DIAGNOSIS — Z0000 Encounter for general adult medical examination without abnormal findings: Secondary | ICD-10-CM | POA: Diagnosis not present

## 2018-10-30 LAB — LIPID PANEL
Cholesterol: 127 mg/dL (ref 0–200)
HDL: 45.2 mg/dL (ref >39.00)
LDL Cholesterol: 63 mg/dL (ref 0–99)
NonHDL: 81.76
Total CHOL/HDL Ratio: 3
Triglycerides: 92 mg/dL (ref 0.0–149.0)
VLDL: 18.4 mg/dL (ref 0.0–40.0)

## 2018-10-30 LAB — COMPREHENSIVE METABOLIC PANEL
ALBUMIN: 3.7 g/dL (ref 3.5–5.2)
ALT: 11 U/L (ref 0–35)
AST: 14 U/L (ref 0–37)
Alkaline Phosphatase: 96 U/L (ref 39–117)
BUN: 11 mg/dL (ref 6–23)
CO2: 26 mEq/L (ref 19–32)
Calcium: 9.3 mg/dL (ref 8.4–10.5)
Chloride: 107 mEq/L (ref 96–112)
Creatinine, Ser: 0.82 mg/dL (ref 0.40–1.20)
GFR: 91.65 mL/min (ref >60.00)
Glucose, Bld: 63 mg/dL — ABNORMAL LOW (ref 70–99)
Potassium: 3.9 mEq/L (ref 3.5–5.1)
Sodium: 141 mEq/L (ref 135–145)
Total Bilirubin: 0.5 mg/dL (ref 0.2–1.2)
Total Protein: 7.9 g/dL (ref 6.0–8.3)

## 2018-11-02 ENCOUNTER — Encounter: Payer: Self-pay | Admitting: Family Medicine

## 2018-12-04 ENCOUNTER — Ambulatory Visit: Payer: BC Managed Care – PPO | Admitting: Family Medicine

## 2019-01-09 ENCOUNTER — Other Ambulatory Visit: Payer: Self-pay | Admitting: Physician Assistant

## 2019-01-26 ENCOUNTER — Encounter: Payer: Self-pay | Admitting: Family Medicine

## 2019-02-07 ENCOUNTER — Other Ambulatory Visit: Payer: Self-pay | Admitting: Family Medicine

## 2019-02-09 MED ORDER — VALSARTAN 160 MG PO TABS: 160.0000 mg | ORAL_TABLET | Freq: Every day | ORAL | 0 refills | Status: DC

## 2019-04-14 ENCOUNTER — Ambulatory Visit: Payer: BC Managed Care – PPO | Admitting: Family Medicine

## 2019-04-14 ENCOUNTER — Telehealth: Payer: Self-pay | Admitting: Family Medicine

## 2019-04-14 MED ORDER — ATENOLOL 50 MG PO TABS: 50.0000 mg | ORAL_TABLET | Freq: Every day | ORAL | 3 refills | Status: DC

## 2019-04-14 NOTE — Telephone Encounter (Signed)
Copied from [place] [id number]. Topic: Quick Communication - Rx Refill/Question >> Apr 14, 2019  1:33 PM Celene Kras A wrote: Medication: atenolol (TENORMIN) 50 MG tablet  Has the patient contacted their pharmacy? Yes.  Pt called stating she took her last pill this morning. Please advise.  (Agent: If no, request that the patient contact the pharmacy for the refill.) (Agent: If yes, when and what did the pharmacy advise?)  Preferred Pharmacy (with phone number or street name): [id number] [address], [address] [place] [address] Phone: [phone] Fax: [phone] Not a 24 hour pharmacy; exact hours not known.    Agent: Please be advised that RX refills may take up to 3 business days. We ask that you follow-up with your pharmacy.

## 2019-04-15 ENCOUNTER — Other Ambulatory Visit: Payer: Self-pay

## 2019-04-15 ENCOUNTER — Encounter: Payer: Self-pay | Admitting: Family Medicine

## 2019-04-15 ENCOUNTER — Ambulatory Visit: Payer: BC Managed Care – PPO | Admitting: Family Medicine

## 2019-04-15 VITALS — BP 108/78 | HR 63 | Temp 98.2°F | Resp 16 | Ht 64.0 in | Wt 239.0 lb

## 2019-04-15 DIAGNOSIS — I10 Essential (primary) hypertension: Secondary | ICD-10-CM | POA: Diagnosis not present

## 2019-04-15 DIAGNOSIS — M1A9XX0 Chronic gout, unspecified, without tophus (tophi): Secondary | ICD-10-CM

## 2019-04-15 MED ORDER — VALSARTAN 160 MG PO TABS: 160.0000 mg | ORAL_TABLET | Freq: Every day | ORAL | 2 refills | Status: DC

## 2019-04-15 MED ORDER — ATENOLOL 50 MG PO TABS: 50.0000 mg | ORAL_TABLET | Freq: Every day | ORAL | 2 refills | Status: DC

## 2019-04-15 NOTE — Progress Notes (Signed)
Chief Complaint  Patient presents with  . Hypertension    6 month follow up  . Medication Management    Subjective Candice [name] is a 58 y.o. female who presents for hypertension follow up. She does not monitor home blood pressures. She is compliant with medications- valsartan 160 mg/d, atenolol 50 mg/d. Patient has these side effects of medication: none She is adhering to a healthy diet overall. Current exercise: Workout videos  Hx of gout. Takes colchicine for flares. Has not had a flare in nearly a year. Affects R great toe.    Past Medical History:  Diagnosis Date  . Allergy   . Gout    great right toe  . Heart murmur   . Hypertension     Review of Systems Cardiovascular: no chest pain Respiratory:  no shortness of breath  Exam BP 108/78 (BP Location: Left Arm, Patient Position: Sitting, Cuff Size: Large)   Pulse 63   Temp 98.2 F (36.8 C) (Oral)   Resp 16   Ht 5\' 4"  (1.626 m)   Wt 239 lb (108.4 kg)   SpO2 99%   BMI 41.02 kg/m  General:  well developed, well nourished, in no apparent distress Heart: RRR, no bruits, no LE edema Lungs: clear to auscultation, no accessory muscle use Psych: well oriented with normal range of affect and appropriate judgment/insight  Essential hypertension  Chronic gout involving toe of right foot without tophus, unspecified cause  Morbid obesity (HCC)  Cont meds.  Counseled on diet and exercise. Goal wt loss by 6 mo f/u: 12-18 lbs.  F/u in 6 mo for CPE. The patient voiced understanding and agreement to the plan.  [place], DO 04/15/19  4:17 PM

## 2019-04-15 NOTE — Patient Instructions (Signed)
Keep the diet clean and stay active.  Goal weight: 221-227 lbs. You can do it!  Let us know if you need anything.

## 2019-05-06 ENCOUNTER — Other Ambulatory Visit: Payer: Self-pay | Admitting: Family Medicine

## 2019-05-15 ENCOUNTER — Telehealth: Payer: Self-pay | Admitting: Family Medicine

## 2019-05-15 NOTE — Telephone Encounter (Signed)
Patient is calling for an appt for possible sinus infection and her throat is raw. Please advise [phone]

## 2019-05-18 ENCOUNTER — Ambulatory Visit: Payer: BC Managed Care – PPO | Admitting: Family Medicine

## 2019-05-18 NOTE — Telephone Encounter (Signed)
Needs virtual visit please.  

## 2019-05-19 ENCOUNTER — Encounter: Payer: Self-pay | Admitting: Family Medicine

## 2019-05-19 ENCOUNTER — Ambulatory Visit (INDEPENDENT_AMBULATORY_CARE_PROVIDER_SITE_OTHER): Payer: BC Managed Care – PPO | Admitting: Family Medicine

## 2019-05-19 ENCOUNTER — Other Ambulatory Visit: Payer: Self-pay

## 2019-05-19 DIAGNOSIS — J302 Other seasonal allergic rhinitis: Secondary | ICD-10-CM | POA: Diagnosis not present

## 2019-05-19 MED ORDER — FLUTICASONE PROPIONATE 50 MCG/ACT NA SUSP: 2.0000 | Freq: Every day | NASAL | 2 refills | Status: AC

## 2019-05-19 MED ORDER — LEVOCETIRIZINE DIHYDROCHLORIDE 5 MG PO TABS: 5.0000 mg | ORAL_TABLET | Freq: Every evening | ORAL | 3 refills | Status: DC

## 2019-05-19 NOTE — Progress Notes (Signed)
Chief Complaint  Patient presents with  . Cough    congestion    [name] here for URI complaints. Due to COVID-19 pandemic, we are interacting via web portal for an electronic face-to-face visit. I verified patient's ID using 2 identifiers. Patient agreed to proceed with visit via this method. Patient is at home, I am at office. Patient and I are present for visit.   Duration: 3 months  Associated symptoms: rhinorrhea, cough from drainage Denies: sinus congestion, sinus pain, itchy watery eyes, ear pain, ear drainage, sore throat, wheezing, shortness of breath, myalgia and fevers Treatment to date: none Sick contacts: No  ROS:  Const: Denies fevers HEENT: As noted in HPI Lungs: No SOB  Past Medical History:  Diagnosis Date  . Allergy   . Gout    great right toe  . Heart murmur   . Hypertension    Exam No conversational dyspnea Age appropriate judgment and insight Nml affect and mood  Seasonal allergic rhinitis, unspecified trigger - Plan: levocetirizine (XYZAL) 5 MG tablet, fluticasone (FLONASE) 50 MCG/ACT nasal spray  Orders as above. Continue to push fluids, practice good hand hygiene, cover mouth when coughing. F/u prn. If starting to experience fevers, shaking, or shortness of breath, seek immediate care. Pt voiced understanding and agreement to the plan.  [place], DO 05/19/19 4:29 PM

## 2019-06-01 ENCOUNTER — Other Ambulatory Visit: Payer: Self-pay

## 2019-06-01 ENCOUNTER — Ambulatory Visit (HOSPITAL_COMMUNITY)
Admission: EM | Admit: 2019-06-01 | Discharge: 2019-06-01 | Disposition: A | Discharge status: Home / Self Care | Payer: BC Managed Care – PPO | Attending: Family Medicine | Admitting: Family Medicine

## 2019-06-01 ENCOUNTER — Encounter (HOSPITAL_COMMUNITY): Payer: Self-pay | Admitting: Emergency Medicine

## 2019-06-01 DIAGNOSIS — M7918 Myalgia, other site: Secondary | ICD-10-CM | POA: Diagnosis not present

## 2019-06-01 DIAGNOSIS — L930 Discoid lupus erythematosus: Secondary | ICD-10-CM

## 2019-06-01 HISTORY — DX: Discoid lupus erythematosus: L93.0

## 2019-06-01 MED ORDER — TIZANIDINE HCL 4 MG PO TABS: 4.0000 mg | ORAL_TABLET | Freq: Four times a day (QID) | ORAL | 0 refills | Status: DC | PRN

## 2019-06-01 MED ORDER — IBUPROFEN 800 MG PO TABS: 800.0000 mg | ORAL_TABLET | Freq: Three times a day (TID) | ORAL | 0 refills | Status: DC

## 2019-06-01 NOTE — Discharge Instructions (Addendum)
Take the ibuprofen 3 times a day with food This is for pain and inflammation The tizanidine is a mild muscle relaxer.  It helps at night Ice or heat to area Activity as tolerated Return if worse instead of better
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
## 2019-07-06 ENCOUNTER — Telehealth: Payer: Self-pay | Admitting: *Deleted

## 2019-07-06 NOTE — Telephone Encounter (Signed)
Who Is Calling Patient / Member / Family / Caregiver Caller Name [place] Phone Number [phone] Patient Name Candice [name] Patient DOB 1961-10-28 Call Type Message Only Information Provided Reason for Call Request to Schedule Office Appointment Initial Comment Caller is needing to make appt for a gout flare up, declined triage.

## 2019-07-06 NOTE — Telephone Encounter (Signed)
Called patient to schedule appointment but she states that her flare has resolve and do not want an appointment at this time

## 2019-09-09 ENCOUNTER — Ambulatory Visit (INDEPENDENT_AMBULATORY_CARE_PROVIDER_SITE_OTHER): Payer: BC Managed Care – PPO

## 2019-09-09 ENCOUNTER — Other Ambulatory Visit: Payer: Self-pay

## 2019-09-09 DIAGNOSIS — Z23 Encounter for immunization: Secondary | ICD-10-CM

## 2019-09-22 ENCOUNTER — Other Ambulatory Visit: Payer: Self-pay

## 2019-09-23 ENCOUNTER — Other Ambulatory Visit (HOSPITAL_COMMUNITY)
Admission: RE | Admit: 2019-09-23 | Discharge: 2019-09-23 | Disposition: A | Discharge status: Home / Self Care | Payer: BC Managed Care – PPO | Source: Ambulatory Visit | Attending: Obstetrics and Gynecology | Admitting: Obstetrics and Gynecology

## 2019-09-23 ENCOUNTER — Ambulatory Visit: Payer: BC Managed Care – PPO | Admitting: Obstetrics and Gynecology

## 2019-09-23 ENCOUNTER — Encounter: Payer: Self-pay | Admitting: Obstetrics and Gynecology

## 2019-09-23 VITALS — BP 130/90 | HR 64 | Temp 97.2°F | Ht 64.0 in | Wt 240.8 lb

## 2019-09-23 DIAGNOSIS — Z124 Encounter for screening for malignant neoplasm of cervix: Secondary | ICD-10-CM | POA: Insufficient documentation

## 2019-09-23 DIAGNOSIS — Z01419 Encounter for gynecological examination (general) (routine) without abnormal findings: Secondary | ICD-10-CM

## 2019-09-23 NOTE — Patient Instructions (Signed)
EXERCISE AND DIET:  We recommended that you start or continue a regular exercise program for good health. Regular exercise means any activity that makes your heart beat faster and makes you sweat.  We recommend exercising at least 30 minutes per day at least 3 days a week, preferably 4 or 5.  We also recommend a diet low in fat and sugar.  Inactivity, poor dietary choices and obesity can cause diabetes, heart attack, stroke, and kidney damage, among others.    ALCOHOL AND SMOKING:  Women should limit their alcohol intake to no more than 7 drinks/beers/glasses of wine (combined, not each!) per week. Moderation of alcohol intake to this level decreases your risk of breast cancer and liver damage. And of course, no recreational drugs are part of a healthy lifestyle.  And absolutely no smoking or even second hand smoke. Most people know smoking can cause heart and lung diseases, but did you know it also contributes to weakening of your bones? Aging of your skin?  Yellowing of your teeth and nails?  CALCIUM AND VITAMIN D:  Adequate intake of calcium and Vitamin D are recommended.  The recommendations for exact amounts of these supplements seem to change often, but generally speaking 1,200 mg of calcium (between diet and supplement) and 800 units of Vitamin D per day seems prudent. Certain women may benefit from higher intake of Vitamin D.  If you are among these women, your doctor will have told you during your visit.    PAP SMEARS:  Pap smears, to check for cervical cancer or precancers,  have traditionally been done yearly, although recent scientific advances have shown that most women can have pap smears less often.  However, every woman still should have a physical exam from her gynecologist every year. It will include a breast check, inspection of the vulva and vagina to check for abnormal growths or skin changes, a visual exam of the cervix, and then an exam to evaluate the size and shape of the uterus and  ovaries.  And after 60 years of age, a rectal exam is indicated to check for rectal cancers. We will also provide age appropriate advice regarding health maintenance, like when you should have certain vaccines, screening for sexually transmitted diseases, bone density testing, colonoscopy, mammograms, etc.   MAMMOGRAMS:  All women over 40 years old should have a yearly mammogram. Many facilities now offer a "3D" mammogram, which may cost around $50 extra out of pocket. If possible,  we recommend you accept the option to have the 3D mammogram performed.  It both reduces the number of women who will be called back for extra views which then turn out to be normal, and it is better than the routine mammogram at detecting truly abnormal areas.    COLON CANCER SCREENING: Now recommend starting at age 45. At this time colonoscopy is not covered for routine screening until 50. There are take home tests that can be done between 45-49.   COLONOSCOPY:  Colonoscopy to screen for colon cancer is recommended for all women at age 50.  We know, you hate the idea of the prep.  We agree, BUT, having colon cancer and not knowing it is worse!!  Colon cancer so often starts as a polyp that can be seen and removed at colonscopy, which can quite literally save your life!  And if your first colonoscopy is normal and you have no family history of colon cancer, most women don't have to have it again for   10 years.  Once every ten years, you can do something that may end up saving your life, right?  We will be happy to help you get it scheduled when you are ready.  Be sure to check your insurance coverage so you understand how much it will cost.  It may be covered as a preventative service at no cost, but you should check your particular policy.      Breast Self-Awareness Breast self-awareness means being familiar with how your breasts look and feel. It involves checking your breasts regularly and reporting any changes to your  health care provider. Practicing breast self-awareness is important. A change in your breasts can be a sign of a serious medical problem. Being familiar with how your breasts look and feel allows you to find any problems early, when treatment is more likely to be successful. All women should practice breast self-awareness, including women who have had breast implants. How to do a breast self-exam One way to learn what is normal for your breasts and whether your breasts are changing is to do a breast self-exam. To do a breast self-exam: Look for Changes  1. Remove all the clothing above your waist. 2. Stand in front of a mirror in a room with good lighting. 3. Put your hands on your hips. 4. Push your hands firmly downward. 5. Compare your breasts in the mirror. Look for differences between them (asymmetry), such as: ? Differences in shape. ? Differences in size. ? Puckers, dips, and bumps in one breast and not the other. 6. Look at each breast for changes in your skin, such as: ? Redness. ? Scaly areas. 7. Look for changes in your nipples, such as: ? Discharge. ? Bleeding. ? Dimpling. ? Redness. ? A change in position. Feel for Changes Carefully feel your breasts for lumps and changes. It is best to do this while lying on your back on the floor and again while sitting or standing in the shower or tub with soapy water on your skin. Feel each breast in the following way:  Place the arm on the side of the breast you are examining above your head.  Feel your breast with the other hand.  Start in the nipple area and make  inch (2 cm) overlapping circles to feel your breast. Use the pads of your three middle fingers to do this. Apply light pressure, then medium pressure, then firm pressure. The light pressure will allow you to feel the tissue closest to the skin. The medium pressure will allow you to feel the tissue that is a little deeper. The firm pressure will allow you to feel the tissue  close to the ribs.  Continue the overlapping circles, moving downward over the breast until you feel your ribs below your breast.  Move one finger-width toward the center of the body. Continue to use the  inch (2 cm) overlapping circles to feel your breast as you move slowly up toward your collarbone.  Continue the up and down exam using all three pressures until you reach your armpit.  Write Down What You Find  Write down what is normal for each breast and any changes that you find. Keep a written record with breast changes or normal findings for each breast. By writing this information down, you do not need to depend only on memory for size, tenderness, or location. Write down where you are in your menstrual cycle, if you are still menstruating. If you are having trouble noticing differences   in your breasts, do not get discouraged. With time you will become more familiar with the variations in your breasts and more comfortable with the exam. How often should I examine my breasts? Examine your breasts every month. If you are breastfeeding, the best time to examine your breasts is after a feeding or after using a breast pump. If you menstruate, the best time to examine your breasts is 5-7 days after your period is over. During your period, your breasts are lumpier, and it may be more difficult to notice changes. When should I see my health care provider? See your health care provider if you notice:  A change in shape or size of your breasts or nipples.  A change in the skin of your breast or nipples, such as a reddened or scaly area.  Unusual discharge from your nipples.  A lump or thick area that was not there before.  Pain in your breasts.  Anything that concerns you.  

## 2019-09-23 NOTE — Progress Notes (Signed)
58 y.o. G61P1011 Divorced Black or [place] American Not Hispanic or Latino female here for annual exam.  No vaginal bleeding. Not sexually active.    No LMP recorded. Patient is postmenopausal.          Sexually active: No.  The current method of family planning is post menopausal status, tubal ligation.  Exercising: No.  The patient does not participate in regular exercise at present. Smoker:  no  Health Maintenance: Pap: 02/23/2016 WNL NEG History of abnormal Pap:  no MMG:  10/30/2018 Birads 1 negative BMD:   Never Colonoscopy: 10/14/2017 WNL TDaP:  02/23/2016 Gardasil: N/A   reports that she has never smoked. She has never used smokeless tobacco. 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 One son, 2 grand children.   Past Medical History:  Diagnosis Date  . Allergy   . Gout    great right toe  . Heart murmur   . Hypertension     Past Surgical History:  Procedure Laterality Date  . OVARIAN CYST REMOVAL Left    20 yrs ago  . TUBAL LIGATION      Current Outpatient Medications  Medication Sig Dispense Refill  . atenolol (TENORMIN) 50 MG tablet Take 1 tablet (50 mg total) by mouth daily. 90 tablet 2  . Emollient (EUCERIN) lotion Apply topically onto the skin as needed for dry skin.    . fluticasone (FLONASE) 50 MCG/ACT nasal spray Place 2 sprays into both nostrils daily. 16 g 2  . hydroxychloroquine (PLAQUENIL) 200 MG tablet Take 200 mg by mouth 2 (two) times daily.    Marland Kitchen ibuprofen (ADVIL) 800 MG tablet Take 1 tablet (800 mg total) by mouth 3 (three) times daily. 21 tablet 0  . levocetirizine (XYZAL) 5 MG tablet Take 1 tablet (5 mg total) by mouth every evening. 30 tablet 3  . valsartan (DIOVAN) 160 MG tablet TAKE 1 TABLET BY MOUTH EVERY DAY 90 tablet 0  . probenecid (BENEMID) 500 MG tablet Take 500 mg by mouth 2 (two) times daily.     No current facility-administered  medications for this visit.     Family History  Problem Relation Age of Onset  . Stroke Mother   . Kidney disease Mother   . Heart disease Sister   . Aneurysm Sister   . Colon cancer Neg Hx   . Colon polyps Neg Hx   . Esophageal cancer Neg Hx   . Rectal cancer Neg Hx   . Stomach cancer Neg Hx   . Pancreatic cancer Neg Hx     Review of Systems  Constitutional: Negative.   HENT: Negative.   Eyes: Negative.   Respiratory: Negative.   Cardiovascular: Negative.   Gastrointestinal: Negative.   Endocrine: Negative.   Genitourinary: Negative.   Musculoskeletal: Negative.   Skin: Negative.   Allergic/Immunologic: Negative.   Neurological: Negative.   Hematological: Negative.   Psychiatric/Behavioral: Negative.     Exam:   BP 130/90 (BP Location: Right Arm, Patient Position: Sitting, Cuff Size: Large)   Pulse 64   Temp (!) 97.2 F (36.2 C) (Skin)   Ht 5\' 4"  (1.626 m)   Wt 240 lb 12.8 oz (109.2 kg)   BMI 41.33 kg/m   Weight change: @WEIGHTCHANGE @ Height:   Height: 5\' 4"  (162.6 cm)  Ht Readings from Last 3 Encounters:  09/23/19 5\' 4"  (1.626 m)  04/15/19 5\' 4"  (1.626 m)  10/23/18 5\' 4"  (1.626 m)    General appearance: alert, cooperative and appears stated age Head: Normocephalic, without obvious abnormality, atraumatic Neck: no adenopathy, supple, symmetrical, trachea midline and thyroid normal to inspection and palpation Lungs: clear to auscultation bilaterally Cardiovascular: regular rate and rhythm Breasts: normal appearance, no masses or tenderness Abdomen: soft, non-tender; non distended,  no masses,  no organomegaly Extremities: extremities normal, atraumatic, no cyanosis or edema Skin: Skin color, texture, turgor normal. No rashes or lesions Lymph nodes: Cervical, supraclavicular, and axillary nodes normal. No abnormal inguinal nodes palpated Neurologic: Grossly normal   Pelvic: External genitalia:  no lesions              Urethra:  normal appearing urethra  with no masses, tenderness or lesions              Bartholins and Skenes: normal                 Vagina: normal appearing vagina with normal color and discharge, no lesions              Cervix: no lesions               Bimanual Exam:  Uterus:  normal size, contour, position, consistency, mobility, non-tender              Adnexa: no mass, fullness, tenderness               Rectovaginal: Confirms               Anus:  normal sphincter tone, no lesions  Chaperone was present for exam.  A:  Well Woman with normal exam  P:   Discussed breast self exam  Discussed calcium and vit D intake  Pap with hpv  Mammogram next month  Colonoscopy UTD  Labs with primary

## 2019-09-26 ENCOUNTER — Other Ambulatory Visit: Payer: Self-pay | Admitting: *Deleted

## 2019-09-26 DIAGNOSIS — Z20828 Contact with and (suspected) exposure to other viral communicable diseases: Secondary | ICD-10-CM

## 2019-09-26 DIAGNOSIS — Z20822 Contact with and (suspected) exposure to covid-19: Secondary | ICD-10-CM

## 2019-09-28 LAB — NOVEL CORONAVIRUS, NAA: SARS-CoV-2, NAA: NOT DETECTED

## 2019-09-29 LAB — CYTOLOGY - PAP
Comment: NEGATIVE
Diagnosis: UNDETERMINED — AB
High risk HPV: NEGATIVE

## 2019-10-13 ENCOUNTER — Other Ambulatory Visit: Payer: Self-pay | Admitting: Cardiology

## 2019-10-13 DIAGNOSIS — Z20822 Contact with and (suspected) exposure to covid-19: Secondary | ICD-10-CM

## 2019-10-15 LAB — NOVEL CORONAVIRUS, NAA: SARS-CoV-2, NAA: NOT DETECTED

## 2019-11-18 ENCOUNTER — Other Ambulatory Visit: Payer: Self-pay | Admitting: Cardiology

## 2019-11-18 DIAGNOSIS — Z20822 Contact with and (suspected) exposure to covid-19: Secondary | ICD-10-CM

## 2019-11-20 LAB — NOVEL CORONAVIRUS, NAA: SARS-CoV-2, NAA: NOT DETECTED

## 2019-12-12 ENCOUNTER — Other Ambulatory Visit: Payer: Self-pay

## 2019-12-12 DIAGNOSIS — Z20822 Contact with and (suspected) exposure to covid-19: Secondary | ICD-10-CM

## 2019-12-13 LAB — NOVEL CORONAVIRUS, NAA: SARS-CoV-2, NAA: NOT DETECTED

## 2020-01-18 ENCOUNTER — Other Ambulatory Visit: Payer: Self-pay | Admitting: Family Medicine

## 2020-01-18 ENCOUNTER — Other Ambulatory Visit: Payer: Self-pay | Admitting: Medical

## 2020-01-18 DIAGNOSIS — J302 Other seasonal allergic rhinitis: Secondary | ICD-10-CM

## 2020-02-24 ENCOUNTER — Other Ambulatory Visit: Payer: Self-pay | Admitting: Family Medicine

## 2020-03-09 ENCOUNTER — Other Ambulatory Visit: Payer: Self-pay

## 2020-03-09 ENCOUNTER — Encounter: Payer: Self-pay | Admitting: Family Medicine

## 2020-03-09 ENCOUNTER — Ambulatory Visit: Payer: BC Managed Care – PPO | Admitting: Family Medicine

## 2020-03-09 VITALS — BP 110/80 | HR 59 | Temp 96.6°F | Ht 64.0 in | Wt 250.2 lb

## 2020-03-09 DIAGNOSIS — M7631 Iliotibial band syndrome, right leg: Secondary | ICD-10-CM | POA: Diagnosis not present

## 2020-03-09 DIAGNOSIS — M25369 Other instability, unspecified knee: Secondary | ICD-10-CM

## 2020-03-09 DIAGNOSIS — S76011A Strain of muscle, fascia and tendon of right hip, initial encounter: Secondary | ICD-10-CM | POA: Diagnosis not present

## 2020-03-09 DIAGNOSIS — R58 Hemorrhage, not elsewhere classified: Secondary | ICD-10-CM | POA: Diagnosis not present

## 2020-03-09 NOTE — Patient Instructions (Signed)
Heat (pad or rice pillow in microwave) over affected area, 10-15 minutes twice daily.   Ice/cold pack over area for 10-15 min twice daily.  OK to take Tylenol 1000 mg (2 extra strength tabs) or 975 mg (3 regular strength tabs) every 6 hours as needed.  Send me a message in 3-4 weeks if we are not getting better and we will set you up with the sports medicine team.   Iliotibial Band Syndrome Rehab It is normal to feel mild stretching, pulling, tightness, or discomfort as you do these exercises, but you should stop right away if you feel sudden pain or your pain gets worse.  Stretching and range of motion exercises These exercises warm up your muscles and joints and improve the movement and flexibility of your hip and pelvis. Exercise A: Quadriceps, prone    1. Lie on your abdomen on a firm surface, such as a bed or padded floor. 2. Bend your left / right knee and hold your ankle. If you cannot reach your ankle or pant leg, loop a belt around your foot and grab the belt instead. 3. Gently pull your heel toward your buttocks. Your knee should not slide out to the side. You should feel a stretch in the front of your thigh and knee. 4. Hold this position for 30 seconds. Repeat 2 times. Complete this stretch 3 times per week. Exercise B: Iliotibial band    1. Lie on your side with your left / right leg in the top position. 2. Bend both of your knees and grab your left / right ankle. Stretch out your bottom arm to help you balance. 3. Slowly bring your top knee back so your thigh goes behind your trunk. 4. Slowly lower your top leg toward the floor until you feel a gentle stretch on the outside of your left / right hip and thigh. If you do not feel a stretch and your knee will not fall farther, place the heel of your other foot on top of your knee and pull your knee down toward the floor with your foot. 5. Hold this position for 30 seconds. Repeat 2 times. Complete this stretch 3 times per  week. Strengthening exercises These exercises build strength and endurance in your hip and pelvis. Endurance is the ability to use your muscles for a long time, even after they get tired. Exercise C: Straight leg raises (hip abductors)     1. Lie on your side with your left / right leg in the top position. Lie so your head, shoulder, knee, and hip line up. You may bend your bottom knee to help you balance. 2. Roll your hips slightly forward so your hips are stacked directly over each other and your left / right knee is facing forward. 3. Tense the muscles in your outer thigh and lift your top leg 4-6 inches (10-15 cm). 4. Hold this position for 3 seconds. Repeat for a total of 10 reps. 5. Slowly return to the starting position. Let your muscles relax completely before doing another repetition. Repeat 2 times. Complete this exercise 3 times per week. Exercise D: Straight leg raises (hip extensors) 1. Lie on your abdomen on your bed or a firm surface. You can put a pillow under your hips if that is more comfortable. 2. Bend your left / right knee so your foot is straight up in the air. 3. Squeeze your buttock muscles and lift your left / right thigh off the bed. Do not let  your back arch. 4. Tense this muscle as hard as you can without increasing any knee pain. 5. Hold this position for 2 seconds. Repeat for a total of 10 reps 6. Slowly lower your leg to the starting position and allow it to relax completely. Repeat 2 times. Complete this exercise 3 times per week. Exercise E: Hip hike 1. Stand sideways on a bottom step. Stand on your left / right leg with your other foot unsupported next to the step. You can hold onto the railing or wall if needed for balance. 2. Keep your knees straight and your torso square. Then, lift your left / right hip up toward the ceiling. 3. Slowly let your left / right hip lower toward the floor, past the starting position. Your foot should get closer to the floor.  Do not lean or bend your knees. Repeat 2 times. Complete this exercise 3 times per week.  Document Released: 10/29/2005 Document Revised: 07/03/2016 Document Reviewed: 09/30/2015 Elsevier Interactive Patient Education  2018 [facility] It is normal to feel mild stretching, pulling, tightness, or discomfort as you do these exercises, but you should stop right away if you feel sudden pain or your pain gets worse.   Stretching and range of motion exercise This exercise warms up your muscles and joints and improves the movement and flexibility of your hip and pelvis. This exercise also helps to relieve pain and stiffness. Exercise A: Lunge (hip flexor stretch)     1. Kneel on the floor on your left / right knee. Bend your other knee so it is directly over your ankle. 2. Keep good posture with your head over your shoulders. Tuck your tailbone underneath you. This will prevent your back from arching too much. 3. You should feel a gentle stretch in the front of your thigh or hip. If you do not feel a stretch, slowly lunge forward with your chest up. 4. Hold this position for 30 seconds. 5. Slowly return to the starting position. Repeat 2 times. Complete this exercise 3 times per week. Strengthening exercises These exercises build strength and endurance in your hip and pelvis. Endurance is the ability to use your muscles for a long time, even after they get tired. Exercise B: Bridge (hip extensors)    1. Lie on your back on a firm surface with your knees bent and your feet flat on the floor. 2. Tighten your buttocks muscles and lift your bottom off the floor until the trunk of your body is level with your thighs. ? You should feel the muscles working in your buttocks and the back of your thighs. If this exercise is too easy, cross your arms over your chest or lift one leg while your bottom is up off the floor. ? Do not arch your back. 3. Hold this position  for 3 seconds. 4. Slowly lower your hips to the starting position. 5. Let your muscles relax completely between repetitions. Repeat 2 times. Complete this exercise 3 times per week. Exercise C: Straight leg raises (hip abductors)    1. Lie on your side with your left / right leg in the top position. Lie so your head, shoulder, knee, and hip line up. Bend your bottom knee to help you balance. 2. Lift your top leg up 4-6 inches (10-15 cm), keeping your toes pointed straight ahead. 3. Hold this position for 2 seconds. 4. Slowly lower your leg to the starting position and let your muscles  relax completely. Repeat for a total of 10 repetitions. Repeat 2 times. Complete this exercise 3 times per week. Exercise D: Hip abductors and external rotators, quadruped 1. Get on your hands and knees on a firm, lightly padded surface. Your hands should be directly below your shoulders, and your knees should be directly below your hips. 2. Lift your left / right knee out to the side. Keep your knee bent. Do not twist your body. 3. Hold this position for 3 seconds. 4. Slowly lower your leg. Repeat for a total of 10 repetitions.  Repeat 2 times. Complete this exercise 3 times per week. Exercise E: Single leg stand 1. Stand near a counter or door frame to hold onto as needed. It is helpful to look in a mirror for this exercise so you can watch your hip. 2. Squeeze your left / right buttock muscles then lift up your other foot. Do not let your left / righthip push out to the side. 3. Hold this position for 3 seconds. Repeat for a total of 10 repetitions. Repeat 2 times. Complete this exercise 3 times per week. Make sure you discuss any questions you have with your health care provider. Document Released: 10/29/2005 Document Revised: 07/05/2016 Document Reviewed: 10/11/2015 Elsevier Interactive Patient Education  [employer].

## 2020-03-09 NOTE — Progress Notes (Signed)
Musculoskeletal Exam  Patient: Candice [name] DOB: 01-18-1961  DOS: 03/09/2020  SUBJECTIVE:  Chief Complaint:   Chief Complaint  Patient presents with  . Follow-up    spot on right leg  . Knee Pain    fall on 12/15/19    Candice [name] is a 59 y.o.  female for evaluation and treatment of leg pain.   Onset:  18 days ago. No inj or change in activity to start this  Location: lateral RLE Character:  aching and sharp  Progression of issue:  is unchanged Associated symptoms: bruising on RLE, some swelling Treatment: to date has been rest.   Neurovascular symptoms: no She is not having any calf pain and denies recent surgery, prolonged bedrest, or prolonged period of inactivity.  The patient also has felt instability in both of her knees.  She tripped in a pothole that was unmarked and fell on both of her knees and the rest of the front of her body.  No bruising, swelling, or redness.  She has no pain currently.  There is no catching or locking with movement.  Past Medical History:  Diagnosis Date  . Allergy   . Gout    great right toe  . Heart murmur   . Hypertension     Objective: VITAL SIGNS: BP 110/80 (BP Location: Left Arm, Patient Position: Sitting, Cuff Size: Normal)   Pulse (!) 59   Temp (!) 96.6 F (35.9 C) (Temporal)   Ht 5\' 4"  (1.626 m)   Wt 250 lb 4 oz (113.5 kg)   SpO2 93%   BMI 42.96 kg/m  Constitutional: Well formed, well developed. No acute distress. Cardiovascular: 1+ pitting edema bilaterally tapering at the mid tibia Thorax & Lungs: No accessory muscle use Musculoskeletal: Knees.   Normal active range of motion: yes.   Normal passive range of motion: yes Tenderness to palpation: no Deformity: no Ecchymosis: no Tests positive: None Tests negative: Patellar apprehension/grind, anterior/posterior drawer, Stines, varus/valgus stress Right lower extremity: Ober's equivocal due to patient's inability to relax; there is some tenderness over the greater  trochanter and distal gluteus medius insertion Neurologic: Normal sensory function. No focal deficits noted. No cerebellar signs Psychiatric: Normal mood. Age appropriate judgment and insight. Alert & oriented x 3.    Assessment:  Instability of knee joint, unspecified laterality  Iliotibial band syndrome of right side  Strain of gluteus medius of right lower extremity, initial encounter  Ecchymosis  Plan: Tylenol, heat, ice.  Stretches and exercises given for the IT band and gluteus medius.  Reassurance given for the ecchymosis noted on the right lower extremity as it can take several more weeks to resolve.  If she does not notice improvement of the instability of her knee joints, we will set her up with the sports medicine team for possible ultrasound of the soft tissue; doubt bone etiology, question meniscal in etiology given her symptoms but nothing remarkable on exam. F/u as originally scheduled.. The patient voiced understanding and agreement to the plan.   [name], DO 03/09/20  3:04 PM

## 2020-05-05 ENCOUNTER — Other Ambulatory Visit: Payer: Self-pay | Admitting: Family Medicine

## 2020-05-05 MED ORDER — VALSARTAN 160 MG PO TABS: 160.0000 mg | ORAL_TABLET | Freq: Every day | ORAL | 1 refills | Status: DC

## 2020-05-05 NOTE — Telephone Encounter (Signed)
Medication: valsartan (DIOVAN) 160 MG tablet      Has the patient contacted their pharmacy?  (If no, request that the patient contact the pharmacy for the refill.) (If yes, when and what did the pharmacy advise?)     Preferred Pharmacy (with phone number or street name): [id number] [place], [place]  [address] [place] [address]  Phone:  [phone] Fax:  [phone]      Agent: Please be advised that RX refills may take up to 3 business days. We ask that you follow-up with your pharmacy.

## 2020-05-05 NOTE — Telephone Encounter (Signed)
Rx sent 

## 2020-08-31 ENCOUNTER — Other Ambulatory Visit: Payer: Self-pay | Admitting: Family Medicine

## 2020-08-31 DIAGNOSIS — J302 Other seasonal allergic rhinitis: Secondary | ICD-10-CM

## 2020-11-12 ENCOUNTER — Other Ambulatory Visit: Payer: Self-pay | Admitting: Family Medicine

## 2020-11-27 ENCOUNTER — Other Ambulatory Visit: Payer: Self-pay | Admitting: Family Medicine

## 2020-11-27 DIAGNOSIS — J302 Other seasonal allergic rhinitis: Secondary | ICD-10-CM

## 2020-12-14 ENCOUNTER — Other Ambulatory Visit: Payer: Self-pay | Admitting: Family Medicine

## 2020-12-28 ENCOUNTER — Other Ambulatory Visit: Payer: Self-pay

## 2020-12-28 ENCOUNTER — Emergency Department
Admission: RE | Admit: 2020-12-28 | Discharge: 2020-12-28 | Disposition: A | Discharge status: Home / Self Care | Payer: Self-pay | Source: Ambulatory Visit | Attending: Family Medicine | Admitting: Family Medicine

## 2020-12-28 VITALS — BP 150/90 | HR 61 | Temp 98.5°F | Resp 16

## 2020-12-28 DIAGNOSIS — J029 Acute pharyngitis, unspecified: Secondary | ICD-10-CM | POA: Diagnosis not present

## 2020-12-28 DIAGNOSIS — N898 Other specified noninflammatory disorders of vagina: Secondary | ICD-10-CM

## 2020-12-28 LAB — POCT RAPID STREP A (OFFICE): Rapid Strep A Screen: NEGATIVE

## 2020-12-28 MED ORDER — FLUCONAZOLE 150 MG PO TABS: 150.0000 mg | ORAL_TABLET | Freq: Every day | ORAL | 0 refills | Status: DC

## 2020-12-28 NOTE — ED Provider Notes (Signed)
[name]    CSN: [id number] Arrival date & time: 12/28/20  1243      History   Chief Complaint Chief Complaint  Patient presents with  . Sore Throat  . Vaginal Itching    HPI Candice [name] is a 60 y.o. female.   HPI   Patient woke up this morning with a sore throat.  No headache or body aches.  No fever or chills.  It hurts to talk.  It hurts to swallow.  She is able to swallow water.  No runny nose.  No cough.  No known exposure to Covid or to strep.  She states that she has had Covid vaccinations.  She states that she did travel over the weekend to [place] to be with family.  She was not exposed to any illness that she was made aware of. Patient states she also has some vaginal itching.  No discharge.  No odor.  Not sexually active.  Postmenopausal since 10 years. Past Medical History:  Diagnosis Date  . Allergy   . Gout    great right toe  . Heart murmur   . Hypertension     Patient Active Problem List   Diagnosis Date Noted  . 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 Acute maxillary sinusitis 09/30/2018  . Carpal tunnel syndrome of right wrist 01/01/2018  . Left ventricular hypertrophy due to hypertensive disease 10/08/2017  . Abnormal EKG 10/08/2017  . Skin lesion of left ear 03/16/2016  . Skin lesion of right ear 03/16/2016  . Cardiomegaly 11/06/2014  . Essential hypertension 08/19/2013  . Gout 08/19/2013  . Allergic urticaria 06/23/2013  . Contact dermatitis 07/16/2012  . Vitamin D deficiency 11/12/2011  . Anemia 10/08/2011  . Hypokalemia 10/03/2011  . Morbid obesity ([place]) 10/03/2011    Past Surgical History:  Procedure Laterality Date  . OVARIAN CYST REMOVAL Left    20 yrs ago  . TUBAL LIGATION      OB History    Gravida  2   Para  1   Term  1   Preterm      AB  1   Living  1     SAB      IAB  1   Ectopic      Multiple      Live Births  1            Home Medications    Prior to Admission medications    Medication Sig Start Date End Date Taking? Authorizing Provider  fluconazole (DIFLUCAN) 150 MG tablet Take 1 tablet (150 mg total) by mouth daily. Repeat in 1 week if needed 12/28/20  Yes Raylene Everts, MD  atenolol (TENORMIN) 50 MG tablet TAKE 1 TABLET BY MOUTH EVERY DAY 12/14/20   Shelda Pal, DO  colchicine 0.6 MG tablet TAKE 1 TABLET BY MOUTH EVERY DAY 01/18/20   Saguier, Percell Miller, PA-C  Emollient (EUCERIN) lotion Apply topically onto the skin as needed for dry skin.    [provider]  fluticasone (FLONASE) 50 MCG/ACT nasal spray Place 2 sprays into both nostrils daily. 05/19/19   Shelda Pal, DO  hydroxychloroquine (PLAQUENIL) 200 MG tablet Take 200 mg by mouth 2 (two) times daily. 08/29/19   [provider]  ibuprofen (ADVIL) 800 MG tablet Take 1 tablet (800 mg total) by mouth 3 (three) times daily. 06/01/19   Raylene Everts, MD  levocetirizine (XYZAL) 5 MG tablet Take 1 tablet (5 mg total) by mouth  every evening. 11/28/20   Shelda Pal, DO  probenecid (BENEMID) 500 MG tablet Take 500 mg by mouth 2 (two) times daily. 09/02/19   [provider]  valsartan (DIOVAN) 160 MG tablet TAKE 1 TABLET BY MOUTH EVERY DAY 11/14/20   Shelda Pal, DO    Family History Family History  Problem Relation Age of Onset  . Stroke Mother   . Kidney disease Mother   . Heart disease Sister   . Aneurysm Sister   . Colon cancer Neg Hx   . Colon polyps Neg Hx   . Esophageal cancer Neg Hx   . Rectal cancer Neg Hx   . Stomach cancer Neg Hx   . Pancreatic cancer Neg Hx     Social History Social History   Tobacco Use  . Smoking status: Never Smoker  . Smokeless tobacco: Never Used  Vaping Use  . Vaping Use: Never used  Substance Use Topics  . Alcohol use: No  . Drug use: No     Allergies   Ace inhibitors and Allopurinol   Review of Systems Review of Systems See HPI  Physical Exam Triage Vital Signs ED Triage Vitals   Enc Vitals Group     BP 12/28/20 1255 (!) 150/90     Pulse Rate 12/28/20 1255 61     Resp 12/28/20 1255 16     Temp 12/28/20 1255 98.5 F (36.9 C)     Temp Source 12/28/20 1255 Oral     SpO2 12/28/20 1255 99 %     Weight --      Height --      Head Circumference --      Peak Flow --      Pain Score 12/28/20 1253 6     Pain Loc --      Pain Edu? --      Excl. in [place]? --    No data found.  Updated Vital Signs BP (!) 150/90 (BP Location: Left Arm)   Pulse 61   Temp 98.5 F (36.9 C) (Oral)   Resp 16   SpO2 99%      Physical Exam Constitutional:      General: She is not in acute distress.    Appearance: She is well-developed and well-nourished. She is obese.  HENT:     Head: Normocephalic and atraumatic.     Mouth/Throat:     Mouth: Oropharynx is clear and moist.     Comments: Tonsils are mildly swollen, moderately red Eyes:     Conjunctiva/sclera: Conjunctivae normal.     Pupils: Pupils are equal, round, and reactive to light.  Cardiovascular:     Rate and Rhythm: Normal rate.  Pulmonary:     Effort: Pulmonary effort is normal. No respiratory distress.     Breath sounds: Normal breath sounds.  Abdominal:     Palpations: Abdomen is soft.  Musculoskeletal:        General: No edema. Normal range of motion.     Cervical back: Normal range of motion.  Lymphadenopathy:     Cervical: No cervical adenopathy.  Skin:    General: Skin is warm and dry.  Neurological:     Mental Status: She is alert.  Psychiatric:        Behavior: Behavior normal.      UC Treatments / Results  Labs (all labs ordered are listed, but only abnormal results are displayed) Labs Reviewed  STREP A DNA PROBE  NOVEL CORONAVIRUS, NAA  POCT RAPID STREP A (OFFICE)    EKG   Radiology No results found.  Procedures Procedures (including critical care time)  Medications Ordered in UC Medications - No data to display  Initial Impression / Assessment and Plan / UC Course  I have  reviewed the triage vital signs and the nursing notes.  Pertinent labs & imaging results that were available during my care of the patient were reviewed by me and considered in my medical decision making (see chart for details).     Strep test is negative.  We will test for Covid Symptomatic care of viral systems reviewed We will treat the vaginal infection with miconazole and Diflucan Return as needed f Final Clinical Impressions(s) / UC Diagnoses   Final diagnoses:  Sore throat  Vaginal itching  Acute pharyngitis, unspecified etiology     Discharge Instructions     I have prescribed Diflucan.  This is a pill to treat yeast infections.  If you need to, you may also use a Monistat cream The strep test is negative The Covid test is pending You need to stay home until your Covid test is available.  You can check for Covid test results on MyChart You will be called if any of your test results are positive Take Tylenol or ibuprofen for the throat pain.  Try salt water gargles, hot tea, or a sore throat spray    ED Prescriptions    Medication Sig Dispense Auth. Provider   fluconazole (DIFLUCAN) 150 MG tablet Take 1 tablet (150 mg total) by mouth daily. Repeat in 1 week if needed 2 tablet Raylene Everts, MD     PDMP not reviewed this encounter.   Raylene Everts, MD 12/28/20 [phone]

## 2020-12-28 NOTE — ED Triage Notes (Signed)
Patient presents to Urgent Care with complaints of vaginal itching since a few days ago. Patient reports she has not noticed vaginal discharge, itching has improved with bathing. Pt also has a sore throat, started yesterday, denies fevers.

## 2020-12-28 NOTE — Discharge Instructions (Addendum)
I have prescribed Diflucan.  This is a pill to treat yeast infections.  If you need to, you may also use a Monistat cream The strep test is negative The Covid test is pending You need to stay home until your Covid test is available.  You can check for Covid test results on MyChart You will be called if any of your test results are positive Take Tylenol or ibuprofen for the throat pain.  Try salt water gargles, hot tea, or a sore throat spray

## 2020-12-30 LAB — NOVEL CORONAVIRUS, NAA

## 2020-12-30 LAB — CULTURE, GROUP A STREP
MICRO NUMBER:: 11543507
SPECIMEN QUALITY:: ADEQUATE

## 2021-01-23 ENCOUNTER — Other Ambulatory Visit: Payer: Self-pay | Admitting: Family Medicine

## 2021-01-23 DIAGNOSIS — J302 Other seasonal allergic rhinitis: Secondary | ICD-10-CM

## 2021-02-02 ENCOUNTER — Other Ambulatory Visit: Payer: Self-pay | Admitting: Family Medicine

## 2021-02-02 DIAGNOSIS — J302 Other seasonal allergic rhinitis: Secondary | ICD-10-CM

## 2021-02-07 ENCOUNTER — Ambulatory Visit: Payer: BC Managed Care – PPO | Admitting: Family Medicine

## 2021-02-07 ENCOUNTER — Encounter: Payer: Self-pay | Admitting: Family Medicine

## 2021-02-07 ENCOUNTER — Other Ambulatory Visit: Payer: Self-pay

## 2021-02-07 VITALS — BP 108/74 | HR 60 | Temp 97.7°F | Ht 64.0 in | Wt 255.0 lb

## 2021-02-07 DIAGNOSIS — S76011A Strain of muscle, fascia and tendon of right hip, initial encounter: Secondary | ICD-10-CM | POA: Diagnosis not present

## 2021-02-07 DIAGNOSIS — G5701 Lesion of sciatic nerve, right lower limb: Secondary | ICD-10-CM

## 2021-02-07 DIAGNOSIS — R599 Enlarged lymph nodes, unspecified: Secondary | ICD-10-CM

## 2021-02-07 DIAGNOSIS — J302 Other seasonal allergic rhinitis: Secondary | ICD-10-CM | POA: Diagnosis not present

## 2021-02-07 DIAGNOSIS — L568 Other specified acute skin changes due to ultraviolet radiation: Secondary | ICD-10-CM

## 2021-02-07 MED ORDER — TRIAMCINOLONE ACETONIDE 0.1 % EX OINT: 1.0000 "application " | TOPICAL_OINTMENT | CUTANEOUS | 1 refills | Status: AC | PRN

## 2021-02-07 MED ORDER — LEVOCETIRIZINE DIHYDROCHLORIDE 5 MG PO TABS: ORAL_TABLET | ORAL | 2 refills | Status: DC

## 2021-02-07 NOTE — Patient Instructions (Addendum)
Ice/cold pack over area for 10-15 min twice daily.  Heat (pad or rice pillow in microwave) over affected area, 10-15 minutes twice daily.   OK to take Tylenol 1000 mg (2 extra strength tabs) or 975 mg (3 regular strength tabs) every 6 hours as needed.  Give [place] 2-3 business days to get the results of your labs back.   Let us know if you need anything.  Piriformis Syndrome Rehab It is normal to feel mild stretching, pulling, tightness, or discomfort as you do these exercises, but you should stop right away if you feel sudden pain or your pain gets worse.  Stretching and range of motion exercises These exercises warm up your muscles and joints and improve the movement and flexibility of your hip and pelvis. These exercises also help to relieve pain, numbness, and tingling. Exercise A: Hip rotators   1. Lie on your back on a firm surface. 2. Pull your left / right knee toward your same shoulder with your left / right hand until your knee is pointing toward the ceiling. Hold your left / right ankle with your other hand. 3. Keeping your knee steady, gently pull your left / right ankle toward your other shoulder until you feel a stretch in your buttocks. 4. Hold this position for 30 seconds. Repeat 2 times. Complete this stretch 3 times per week. Exercise B: Hip extensors 1. Lie on your back on a firm surface. Both of your legs should be straight. 2. Pull your left / right knee to your chest. Hold your leg in this position by holding onto the back of your thigh or the front of your knee. 3. Hold this position for 30 seconds. 4. Slowly return to the starting position. Repeat 2 times. Complete this stretch 3 times per week.  Strengthening exercises These exercises build strength and endurance in your hip and thigh muscles. Endurance is the ability to use your muscles for a long time, even after they get tired. Exercise C: Straight leg raises (hip abductors)    1. Lie on your side with  your left / right leg in the top position. Lie so your head, shoulder, knee, and hip line up. Bend your bottom knee to help you balance. 2. Lift your top leg up 4-6 inches (10-15 cm), keeping your toes pointed straight ahead. 3. Hold this position for 1 second. 4. Slowly lower your leg to the starting position. Let your muscles relax completely. Repeat for a total of 10 repetitions. Repeat 2 times. Complete this exercise 3 times per week. Exercise D: Hip abductors and rotators, quadruped   1. Get on your hands and knees on a firm, lightly padded surface. Your hands should be directly below your shoulders, and your knees should be directly below your hips. 2. Lift your left / right knee out to the side. Keep your knee bent. Do not twist your body. 3. Hold this position for 1 seconds. 4. Slowly lower your leg. Repeat for a total of 10 repetitions.  Repeat 1 times. Complete this exercise 3 times per week. Exercise E: Straight leg raises (hip extensors) 1. Lie on your abdomen on a bed or a firm surface with a pillow under your hips. 2. Squeeze your buttock muscles and lift your left / right thigh off the bed. Do not let your back arch. 3. Hold this position for 3 seconds. 4. Slowly return to the starting position. Let your muscles relax completely before doing another repetition. Repeat 2 times. Complete  this exercise 3 times per week.  This information is not intended to replace advice given to you by your health care provider. Make sure you discuss any questions you have with your health care provider. Document Released: 10/29/2005 Document Revised: 07/03/2016 Document Reviewed: 10/11/2015 Elsevier Interactive Patient Education  2018 [place] Syndrome Rehab It is normal to feel mild stretching, pulling, tightness, or discomfort as you do these exercises, but you should stop right away if you feel sudden pain or your pain gets worse.   Stretching and range of motion  exercise This exercise warms up your muscles and joints and improves the movement and flexibility of your hip and pelvis. This exercise also helps to relieve pain and stiffness. Exercise A: Lunge (hip flexor stretch)     1. Kneel on the floor on your left / right knee. Bend your other knee so it is directly over your ankle. 2. Keep good posture with your head over your shoulders. Tuck your tailbone underneath you. This will prevent your back from arching too much. 3. You should feel a gentle stretch in the front of your thigh or hip. If you do not feel a stretch, slowly lunge forward with your chest up. 4. Hold this position for 30 seconds. 5. Slowly return to the starting position. Repeat 2 times. Complete this exercise 3 times per week. Strengthening exercises These exercises build strength and endurance in your hip and pelvis. Endurance is the ability to use your muscles for a long time, even after they get tired. Exercise B: Bridge (hip extensors)    1. Lie on your back on a firm surface with your knees bent and your feet flat on the floor. 2. Tighten your buttocks muscles and lift your bottom off the floor until the trunk of your body is level with your thighs. ? You should feel the muscles working in your buttocks and the back of your thighs. If this exercise is too easy, cross your arms over your chest or lift one leg while your bottom is up off the floor. ? Do not arch your back. 3. Hold this position for 3 seconds. 4. Slowly lower your hips to the starting position. 5. Let your muscles relax completely between repetitions. Repeat 2 times. Complete this exercise 3 times per week. Exercise C: Straight leg raises (hip abductors)    1. Lie on your side with your left / right leg in the top position. Lie so your head, shoulder, knee, and hip line up. Bend your bottom knee to help you balance. 2. Lift your top leg up 4-6 inches (10-15 cm), keeping your toes pointed straight  ahead. 3. Hold this position for 2 seconds. 4. Slowly lower your leg to the starting position and let your muscles relax completely. Repeat for a total of 10 repetitions. Repeat 2 times. Complete this exercise 3 times per week. Exercise D: Hip abductors and external rotators, quadruped 1. Get on your hands and knees on a firm, lightly padded surface. Your hands should be directly below your shoulders, and your knees should be directly below your hips. 2. Lift your left / right knee out to the side. Keep your knee bent. Do not twist your body. 3. Hold this position for 3 seconds. 4. Slowly lower your leg. Repeat for a total of 10 repetitions.  Repeat 2 times. Complete this exercise 3 times per week. Exercise E: Single leg stand 1. Stand near a counter or door frame to hold  onto as needed. It is helpful to look in a mirror for this exercise so you can watch your hip. 2. Squeeze your left / right buttock muscles then lift up your other foot. Do not let your left / righthip push out to the side. 3. Hold this position for 3 seconds. Repeat for a total of 10 repetitions. Repeat 2 times. Complete this exercise 3 times per week. Make sure you discuss any questions you have with your health care provider. Document Released: 10/29/2005 Document Revised: 07/05/2016 Document Reviewed: 10/11/2015 Elsevier Interactive Patient Education  [employer].

## 2021-02-07 NOTE — Progress Notes (Signed)
Chief Complaint  Patient presents with  . bump behind right ear  . Hip Pain    Right hip pain     Candice [name] is a 60 y.o. female here for a skin complaint.  Duration: 6 months Location: behind R ear Pruritic? No Painful? No Drainage? No New soaps/lotions/topicals/detergents? No Other associated symptoms: growing Therapies tried thus far: none  R hip pain Onset:  1 week ago. No inj or change in activity.  Location:  Outer rear portion of buttock on R Character:  sharp  Progression of issue:  is unchanged Associated symptoms: no catching/locking, bruising, swelling, redness Treatment: to date has been home exercises.   Neurovascular symptoms: no  Past Medical History:  Diagnosis Date  . Allergy   . Gout    great right toe  . Heart murmur   . Hypertension     BP 108/74 (BP Location: Left Arm, Patient Position: Sitting, Cuff Size: Large)   Pulse 60   Temp 97.7 F (36.5 C) (Oral)   Ht 5\' 4"  (1.626 m)   Wt 255 lb (115.7 kg)   SpO2 95%   BMI 43.77 kg/m  Gen: awake, alert, appearing stated age Heart: RRR Lungs: CTAB.  No accessory muscle use Skin: there is an elliptical shaped solid lesion in preauricular area measuring 1.1 cm x 0.6 cm.  Borders are smooth and it is freely movable.  No drainage, erythema, TTP, fluctuance, excoriation MSK: Right hip with decreased flexion/adduction, mild tenderness over the distal portion of the gluteus medius; negative FADDIR, FABER, Ober's, logroll, [place] Psych: Age appropriate judgment and insight  Enlarged lymph node - Plan: CBC w/Diff  Piriformis syndrome of right side  Strain of gluteus medius of right lower extremity, initial encounter  Seasonal allergic rhinitis, unspecified trigger - Plan: levocetirizine (XYZAL) 5 MG tablet  Photodermatitis due to sun - Plan: triamcinolone ointment (KENALOG) 0.1 %  Morbid obesity ([place])  1.  Check labs, will consider CT soft tissue if unremarkable. 2/3.  Stretches and  exercises, heat, Tylenol, ice. F/u in 1 month recheck. The patient voiced understanding and agreement to the plan.  [name], DO 02/07/21 3:28 PM

## 2021-02-08 ENCOUNTER — Ambulatory Visit: Payer: BC Managed Care – PPO | Admitting: Family Medicine

## 2021-02-08 ENCOUNTER — Other Ambulatory Visit: Payer: Self-pay | Admitting: Family Medicine

## 2021-02-08 DIAGNOSIS — R599 Enlarged lymph nodes, unspecified: Secondary | ICD-10-CM

## 2021-02-08 LAB — CBC WITH DIFFERENTIAL/PLATELET
Basophils Absolute: 0.1 10*3/uL (ref 0.0–0.1)
Basophils Relative: 1.3 % (ref 0.0–3.0)
Eosinophils Absolute: 0.1 10*3/uL (ref 0.0–0.7)
Eosinophils Relative: 2.1 % (ref 0.0–5.0)
HCT: 36.9 % (ref 36.0–46.0)
Hemoglobin: 12.2 g/dL (ref 12.0–15.0)
Lymphocytes Relative: 39.8 % (ref 12.0–46.0)
Lymphs Abs: 2.2 10*3/uL (ref 0.7–4.0)
MCHC: 33.1 g/dL (ref 30.0–36.0)
MCV: 84.9 fl (ref 78.0–100.0)
Monocytes Absolute: 0.4 10*3/uL (ref 0.1–1.0)
Monocytes Relative: 8.1 % (ref 3.0–12.0)
Neutro Abs: 2.7 10*3/uL (ref 1.4–7.7)
Neutrophils Relative %: 48.7 % (ref 43.0–77.0)
Platelets: 248 10*3/uL (ref 150.0–400.0)
RBC: 4.35 Mil/uL (ref 3.87–5.11)
RDW: 16.3 % — ABNORMAL HIGH (ref 11.5–15.5)
WBC: 5.5 10*3/uL (ref 4.0–10.5)

## 2021-02-16 ENCOUNTER — Other Ambulatory Visit: Payer: Self-pay | Admitting: Family Medicine

## 2021-03-20 ENCOUNTER — Other Ambulatory Visit: Payer: Self-pay | Admitting: Family Medicine

## 2021-03-20 ENCOUNTER — Other Ambulatory Visit: Payer: Self-pay | Admitting: Otolaryngology

## 2021-03-22 ENCOUNTER — Other Ambulatory Visit: Payer: Self-pay | Admitting: Otolaryngology

## 2021-03-24 ENCOUNTER — Other Ambulatory Visit: Payer: Self-pay | Admitting: Otolaryngology

## 2021-03-27 ENCOUNTER — Other Ambulatory Visit: Payer: Self-pay | Admitting: Otolaryngology

## 2021-04-24 ENCOUNTER — Other Ambulatory Visit: Payer: Self-pay | Admitting: Otolaryngology

## 2021-04-25 ENCOUNTER — Other Ambulatory Visit: Payer: Self-pay | Admitting: Otolaryngology

## 2021-05-01 ENCOUNTER — Other Ambulatory Visit: Payer: Self-pay | Admitting: Otolaryngology

## 2021-05-01 DIAGNOSIS — R599 Enlarged lymph nodes, unspecified: Secondary | ICD-10-CM

## 2021-05-01 DIAGNOSIS — R590 Localized enlarged lymph nodes: Secondary | ICD-10-CM

## 2021-07-10 ENCOUNTER — Other Ambulatory Visit: Payer: Self-pay | Admitting: Family Medicine

## 2021-07-10 DIAGNOSIS — Z1231 Encounter for screening mammogram for malignant neoplasm of breast: Secondary | ICD-10-CM

## 2021-08-14 ENCOUNTER — Other Ambulatory Visit: Payer: Self-pay

## 2021-08-14 ENCOUNTER — Ambulatory Visit
Admission: RE | Admit: 2021-08-14 | Discharge: 2021-08-14 | Disposition: A | Discharge status: Home / Self Care | Payer: BC Managed Care – PPO | Source: Ambulatory Visit | Attending: Family Medicine | Admitting: Family Medicine

## 2021-08-14 DIAGNOSIS — Z1231 Encounter for screening mammogram for malignant neoplasm of breast: Secondary | ICD-10-CM

## 2021-09-22 ENCOUNTER — Other Ambulatory Visit: Payer: Self-pay | Admitting: Family Medicine

## 2021-09-22 DIAGNOSIS — J302 Other seasonal allergic rhinitis: Secondary | ICD-10-CM

## 2021-12-12 ENCOUNTER — Encounter: Payer: Self-pay | Admitting: Family Medicine

## 2021-12-12 ENCOUNTER — Ambulatory Visit: Payer: BC Managed Care – PPO | Admitting: Family Medicine

## 2021-12-12 VITALS — BP 122/71 | HR 50 | Temp 98.1°F | Ht 65.0 in | Wt 252.0 lb

## 2021-12-12 DIAGNOSIS — L03116 Cellulitis of left lower limb: Secondary | ICD-10-CM | POA: Diagnosis not present

## 2021-12-12 MED ORDER — FLUCONAZOLE 150 MG PO TABS: ORAL_TABLET | ORAL | 0 refills | Status: DC

## 2021-12-12 MED ORDER — DOXYCYCLINE HYCLATE 100 MG PO TABS: 100.0000 mg | ORAL_TABLET | Freq: Two times a day (BID) | ORAL | 0 refills | Status: AC

## 2021-12-12 NOTE — Patient Instructions (Addendum)
Things to look out for: increasing pain not relieved by ibuprofen/acetaminophen, fevers, spreading redness, drainage of pus, or foul odor.  OK to take Tylenol 1000 mg (2 extra strength tabs) or 975 mg (3 regular strength tabs) every 6 hours as needed.  Ibuprofen 400-600 mg (2-3 over the counter strength tabs) every 6 hours as needed for pain.  Ice/cold pack over area for 10-15 min twice daily.  I recommend getting the updated bivalent covid vaccination booster at your convenience.   The new Shingrix vaccine (for shingles) is a 2 shot series. It can make people feel low energy, achy and almost like they have the flu for 48 hours after injection. Please plan accordingly when deciding on when to get this shot. Call our office for a nurse visit appointment to get this. The second shot of the series is less severe regarding the side effects, but it still lasts 48 hours.   Let us know if you need anything.

## 2021-12-12 NOTE — Progress Notes (Signed)
Chief Complaint  Patient presents with   Leg Pain    Left leg pain for one month    Candice [name] is a 61 y.o. female here for a skin complaint.  Duration: 1 month, getting worse Location: anterior L leg Pruritic? No Painful? Yes Drainage? No New soaps/lotions/topicals/detergents? No Other associated symptoms: burning, sometimes will scale Therapies tried thus far: Eucerin No issues walking  Past Medical History:  Diagnosis Date   Allergy    Gout    great right toe   Heart murmur    Hypertension     BP 122/71    Pulse (!) 50    Temp 98.1 F (36.7 C) (Oral)    Ht 5\' 5"  (1.651 m)    Wt 252 lb (114.3 kg)    SpO2 99%    BMI 41.93 kg/m  Gen: awake, alert, appearing stated age Lungs: No accessory muscle use Skin: Anterior blanching hyperpigmentation over the left tibia that is tender to palpation and warm compared to surrounding tissue.  There is no drainage, fluctuance, excoriation, crepitus. Psych: Age appropriate judgment and insight  Cellulitis of left lower extremity - Plan: doxycycline (VIBRA-TABS) 100 MG tablet, fluconazole (DIFLUCAN) 150 MG tablet  7 days of doxycycline 100 mg twice daily.  Diflucan also sent in should she develop a yeast infection from it.  Ice, Tylenol, ibuprofen.  Warning signs and symptoms verbalized and written down.  She politely declined the flu shot.  Recommended the Shingrix vaccine in addition to the updated bivalent COVID-vaccine. F/u prn. The patient voiced understanding and agreement to the plan.  [place], DO 12/12/21 4:31 PM

## 2021-12-20 ENCOUNTER — Encounter: Payer: Self-pay | Admitting: Family Medicine

## 2021-12-20 ENCOUNTER — Other Ambulatory Visit: Payer: Self-pay | Admitting: Family Medicine

## 2021-12-20 DIAGNOSIS — L03116 Cellulitis of left lower limb: Secondary | ICD-10-CM

## 2021-12-21 ENCOUNTER — Other Ambulatory Visit: Payer: Self-pay | Admitting: Family Medicine

## 2021-12-21 MED ORDER — DOXYCYCLINE HYCLATE 100 MG PO TABS: 100.0000 mg | ORAL_TABLET | Freq: Two times a day (BID) | ORAL | 0 refills | Status: AC

## 2022-01-05 ENCOUNTER — Ambulatory Visit: Payer: BC Managed Care – PPO | Admitting: Family Medicine

## 2022-01-05 ENCOUNTER — Encounter: Payer: Self-pay | Admitting: Family Medicine

## 2022-01-05 VITALS — BP 120/72 | HR 53 | Temp 97.8°F | Resp 16 | Ht 67.0 in | Wt 250.4 lb

## 2022-01-05 DIAGNOSIS — L03116 Cellulitis of left lower limb: Secondary | ICD-10-CM | POA: Diagnosis not present

## 2022-01-05 MED ORDER — CEPHALEXIN 500 MG PO CAPS: 500.0000 mg | ORAL_CAPSULE | Freq: Three times a day (TID) | ORAL | 0 refills | Status: AC

## 2022-01-05 NOTE — Progress Notes (Signed)
Chief Complaint  Patient presents with   Cellulitis    Here for complains of Cellulitis on left leg onset 1 month     Subjective: Patient is a 61 y.o. female here for f/u.  Patient was treated for cellulitis around 1 month ago.  She was placed on 7 days of doxycycline which helped and she received 5 extra days that helped even further.  After coming off of it, her symptoms got worse.  The lower portion of the rash has swelling, redness, and pain.  The pain is increased over the last few days.  She is not having any fevers or drainage.  There is no itching.  Past Medical History:  Diagnosis Date   Allergy    Gout    great right toe   Heart murmur    Hypertension     Objective: BP 120/72 (BP Location: Right Arm, Patient Position: Sitting, Cuff Size: Small)    Pulse (!) 53    Temp 97.8 F (36.6 C) (Oral)    Resp 16    Ht 5\' 7"  (1.702 m)    Wt 250 lb 6.4 oz (113.6 kg)    SpO2 99%    BMI 39.22 kg/m  General: Awake, appears stated age Skin: Over the anterior left lower extremity there is hyperpigmentation over the tibia with erythema over the distal half of LE. +warmth and TTP.  + Soft tissue swelling.  There is no crepitus, fluctuance, drainage, or excoriation.  The lesion does blanch. Lungs: No accessory muscle use Psych: Age appropriate judgment and insight, normal affect and mood  Assessment and Plan: Cellulitis of left lower extremity - Plan: cephALEXin (KEFLEX) 500 MG capsule  10-day course of Keflex.  Ice, Tylenol.  If no improvement, would consider adding Bactrim versus consideration for inpatient treatment.  Could consider steroid for a presumed vasculitis. The patient voiced understanding and agreement to the plan.  [place], DO 01/05/22  12:06 PM

## 2022-01-05 NOTE — Patient Instructions (Signed)
Ice/cold pack over area for 10-15 min twice daily.  OK to take Tylenol 1000 mg (2 extra strength tabs) or 975 mg (3 regular strength tabs) every 6 hours as needed.  Let us know if you need anything.  

## 2022-01-16 ENCOUNTER — Other Ambulatory Visit: Payer: Self-pay | Admitting: Family Medicine

## 2022-01-16 ENCOUNTER — Encounter: Payer: Self-pay | Admitting: Family Medicine

## 2022-01-16 MED ORDER — PREDNISONE 20 MG PO TABS: 40.0000 mg | ORAL_TABLET | Freq: Every day | ORAL | 0 refills | Status: AC

## 2022-01-24 ENCOUNTER — Encounter: Payer: Self-pay | Admitting: Family Medicine

## 2022-01-26 ENCOUNTER — Other Ambulatory Visit: Payer: Self-pay | Admitting: Family Medicine

## 2022-01-26 DIAGNOSIS — L03116 Cellulitis of left lower limb: Secondary | ICD-10-CM

## 2022-04-02 IMAGING — MG MM DIGITAL SCREENING BILAT W/ TOMO AND CAD
6 of 10 series · 6 of 30 positions shown · non-contrast
Comparison: Previous exam(s).

CLINICAL DATA: Screening.

EXAM:
DIGITAL SCREENING BILATERAL MAMMOGRAM WITH TOMOSYNTHESIS AND CAD
TECHNIQUE: Bilateral screening digital craniocaudal and mediolateral oblique
mammograms were obtained. Bilateral screening digital breast
tomosynthesis was performed. The images were evaluated with
computer-aided detection.

[R MLO synth-2D (1 of 2)]
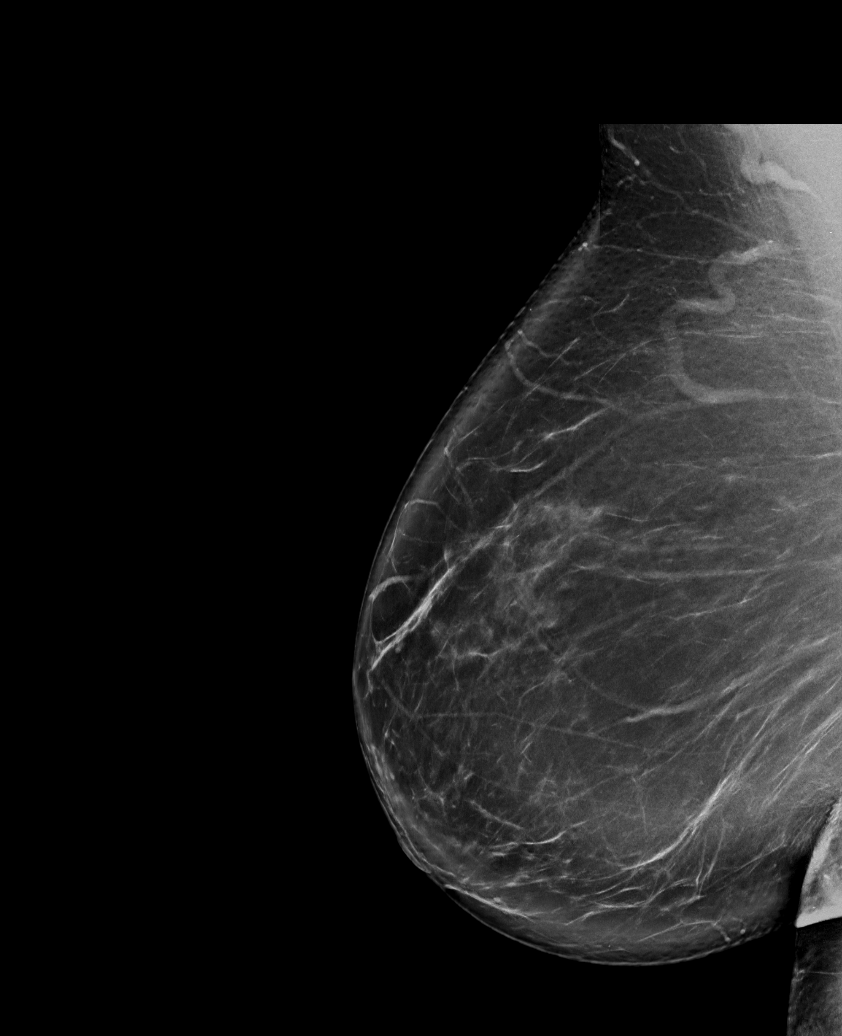

[L MLO synth-2D]
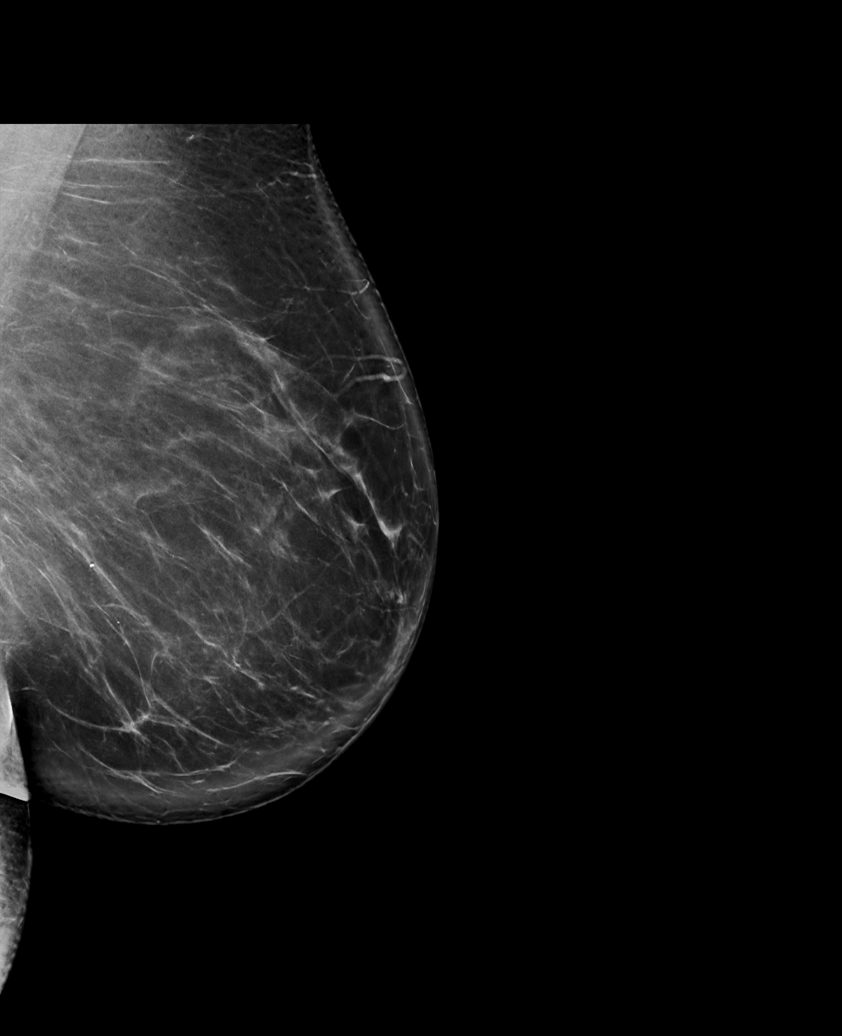

[R CC synth-2D]
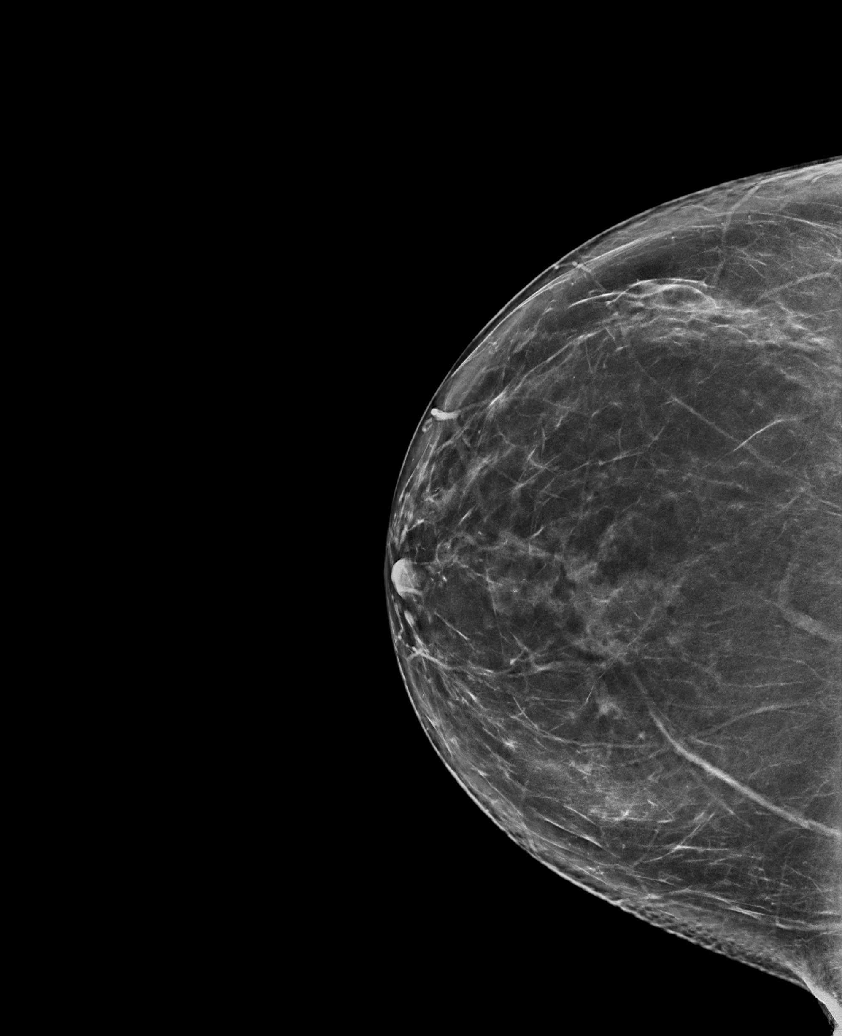

[R MLO synth-2D (2 of 2)]
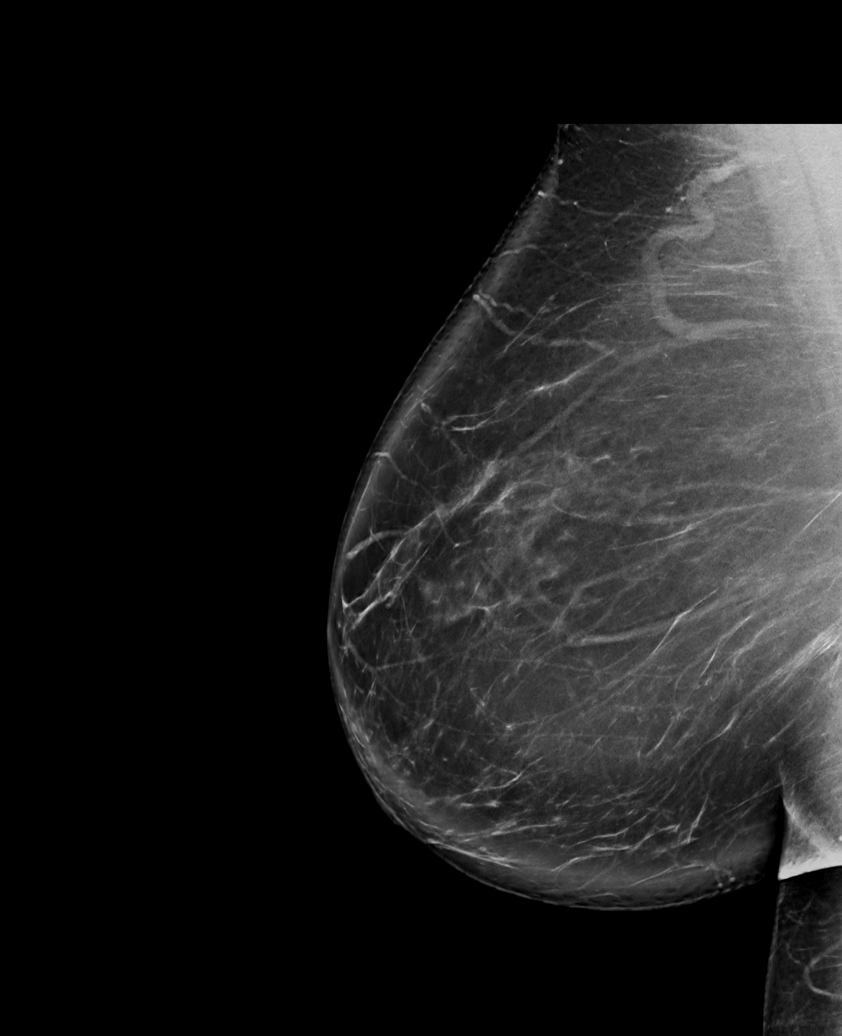

[L CC synth-2D]
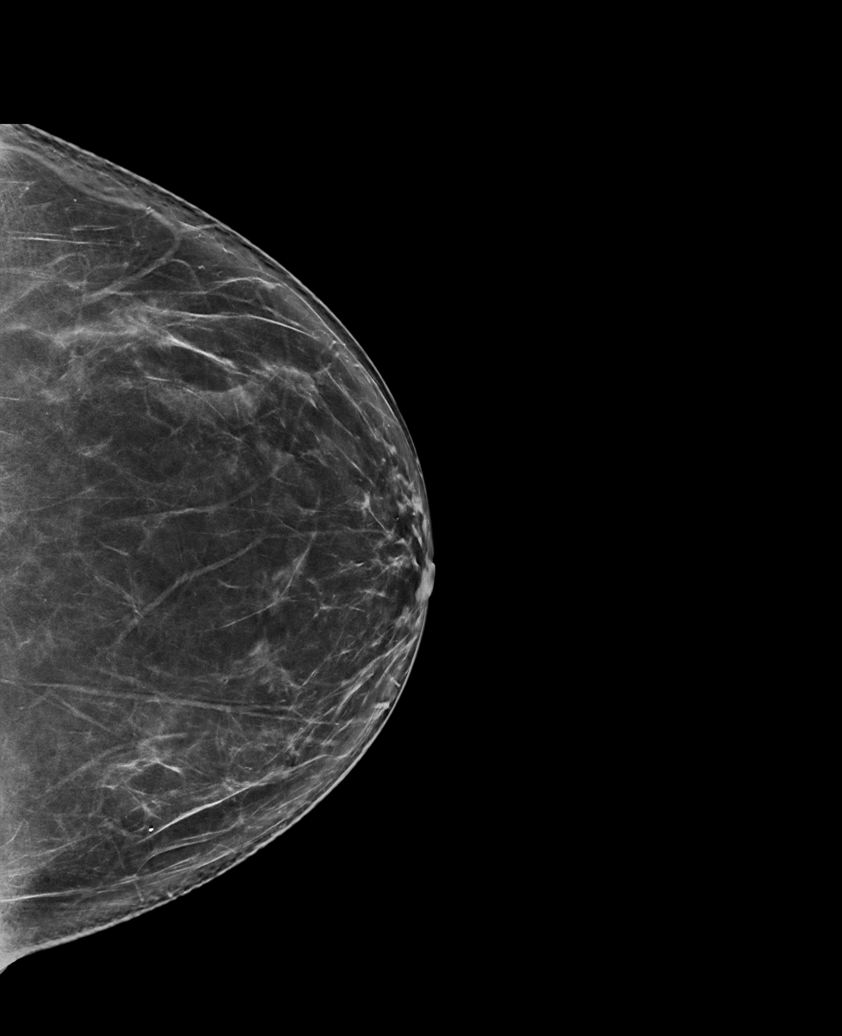

[R CC tomo · tomo slice 43/86.0]
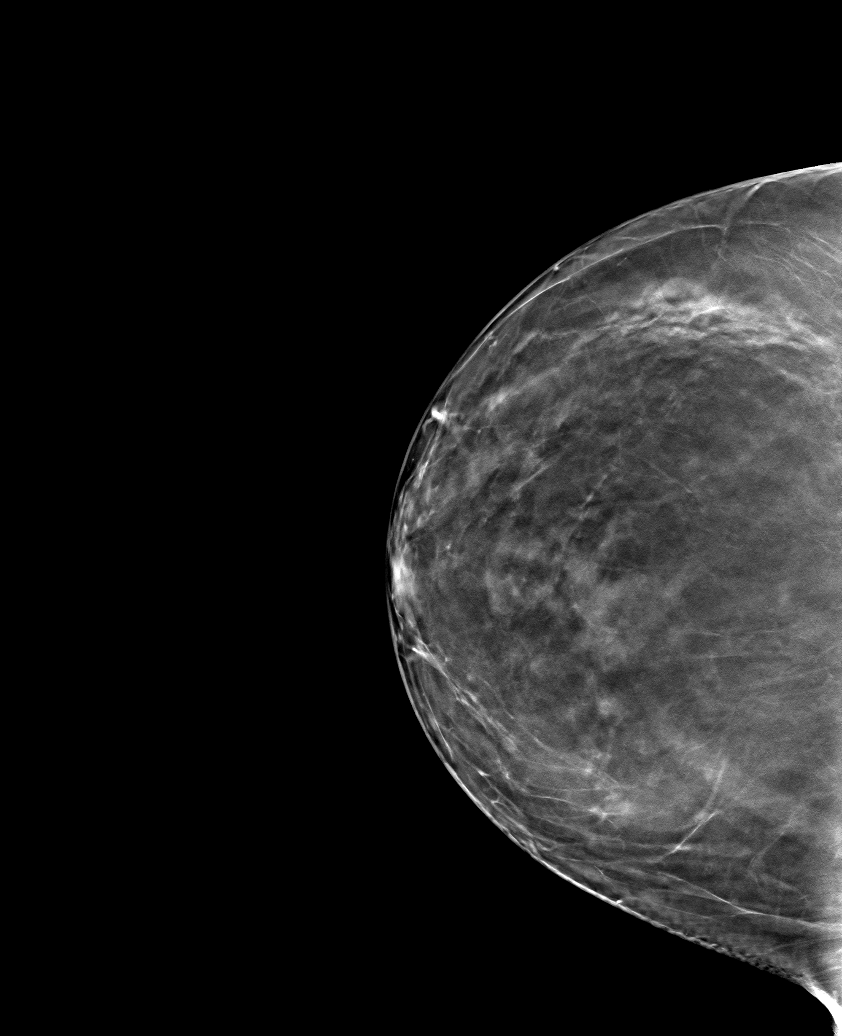

[6 of 30 positions shown; findings below may reference images not displayed]

ACR Breast Density Category b: There are scattered areas of
fibroglandular density.
FINDINGS: There are no findings suspicious for malignancy.
IMPRESSION: No mammographic evidence of malignancy. A result letter of this
screening mammogram will be mailed directly to the patient.

RECOMMENDATION:
Screening mammogram in one year. (Code:51-O-LD2)

BI-RADS CATEGORY  1: Negative.

## 2022-04-17 ENCOUNTER — Other Ambulatory Visit: Payer: Self-pay | Admitting: Family Medicine

## 2022-06-11 ENCOUNTER — Encounter: Payer: Self-pay | Admitting: Family Medicine

## 2022-06-11 ENCOUNTER — Ambulatory Visit: Payer: BC Managed Care – PPO | Admitting: Family Medicine

## 2022-06-11 VITALS — BP 120/86 | HR 59 | Temp 98.7°F | Ht 64.0 in | Wt 254.1 lb

## 2022-06-11 DIAGNOSIS — K1120 Sialoadenitis, unspecified: Secondary | ICD-10-CM | POA: Diagnosis not present

## 2022-06-11 MED ORDER — AMOXICILLIN-POT CLAVULANATE 875-125 MG PO TABS: 1.0000 | ORAL_TABLET | Freq: Two times a day (BID) | ORAL | 0 refills | Status: DC

## 2022-06-11 MED ORDER — PREDNISONE 20 MG PO TABS: 40.0000 mg | ORAL_TABLET | Freq: Every day | ORAL | 0 refills | Status: AC

## 2022-06-11 NOTE — Progress Notes (Signed)
Chief Complaint  Patient presents with   right side of face swollen    Candice [name] is a 61 y.o. female here for a skin complaint.  Duration: 2 days Location: R jaw Pruritic? No Painful? Yes Drainage? No New soaps/lotions/topicals/detergents? No Other associated symptoms: warmth, swelling, difficult to open jaw Therapies tried thus far: Advil, heat, ice  Past Medical History:  Diagnosis Date   Allergy    Gout    great right toe   Heart murmur    Hypertension     BP 120/86   Pulse (!) 59   Temp 98.7 F (37.1 C) (Oral)   Ht '5\' 4"'$  (1.626 m)   Wt 254 lb 2 oz (115.3 kg)   SpO2 99%   BMI 43.62 kg/m  Gen: awake, alert, appearing stated age Lungs: No accessory muscle use Skin: Gross edema over the right TMJ.  There is TTP over the right parotid with induration and soft tissue swelling.  There is no ecchymosis, erythema, skin color changes, drainage, excoriation, or fluctuance. Mouth: MMM, no tongue asymmetry/edema, no lesions noted along the buccal mucosa on the right Psych: Age appropriate judgment and insight  Parotiditis - Plan: predniSONE (DELTASONE) 20 MG tablet, amoxicillin-clavulanate (AUGMENTIN) 875-125 MG tablet  10 days of Augmentin, 5-day prednisone burst, Tylenol, ice, send message if no improvement.  Seek immediate care if worsening symptoms. F/u prn. The patient voiced understanding and agreement to the plan.  [place], DO 06/11/22 11:58 AM

## 2022-06-11 NOTE — Patient Instructions (Addendum)
OK to take Tylenol 1000 mg (2 extra strength tabs) or 975 mg (3 regular strength tabs) every 6 hours as needed.  Ice/cold pack over area for 10-15 min twice daily.  If things worsen, please let me know or seek care.  Let us know if you need anything.

## 2022-06-12 ENCOUNTER — Other Ambulatory Visit: Payer: Self-pay | Admitting: Family Medicine

## 2022-06-12 ENCOUNTER — Encounter: Payer: Self-pay | Admitting: Family Medicine

## 2022-06-12 MED ORDER — TRAMADOL HCL 50 MG PO TABS: 50.0000 mg | ORAL_TABLET | Freq: Three times a day (TID) | ORAL | 0 refills | Status: AC | PRN

## 2022-06-18 ENCOUNTER — Other Ambulatory Visit: Payer: Self-pay | Admitting: Family Medicine

## 2022-06-18 MED ORDER — NYSTATIN 100000 UNIT/ML MT SUSP: 5.0000 mL | Freq: Four times a day (QID) | OROMUCOSAL | 0 refills | Status: DC

## 2022-07-10 ENCOUNTER — Encounter: Payer: Self-pay | Admitting: Nurse Practitioner

## 2022-07-10 ENCOUNTER — Telehealth: Payer: BC Managed Care – PPO | Admitting: Nurse Practitioner

## 2022-07-10 DIAGNOSIS — U071 COVID-19: Secondary | ICD-10-CM

## 2022-07-10 MED ORDER — MOLNUPIRAVIR EUA 200MG CAPSULE: 4.0000 | ORAL_CAPSULE | Freq: Two times a day (BID) | ORAL | 0 refills | Status: AC

## 2022-07-10 NOTE — Progress Notes (Signed)
Virtual Visit Consent   Candice [name], you are scheduled for a virtual visit with a [facility] provider today. Just as with appointments in the office, your consent must be obtained to participate. Your consent will be active for this visit and any virtual visit you may have with one of our providers in the next 365 days. If you have a MyChart account, a copy of this consent can be sent to you electronically.  As this is a virtual visit, video technology does not allow for your provider to perform a traditional examination. This may limit your provider's ability to fully assess your condition. If your provider identifies any concerns that need to be evaluated in person or the need to arrange testing (such as labs, EKG, etc.), we will make arrangements to do so. Although advances in technology are sophisticated, we cannot ensure that it will always work on either your end or our end. If the connection with a video visit is poor, the visit may have to be switched to a telephone visit. With either a video or telephone visit, we are not always able to ensure that we have a secure connection.  By engaging in this virtual visit, you consent to the provision of healthcare and authorize for your insurance to be billed (if applicable) for the services provided during this visit. Depending on your insurance coverage, you may receive a charge related to this service.  I need to obtain your verbal consent now. Are you willing to proceed with your visit today? Candice [name] has provided verbal consent on 07/10/2022 for a virtual visit (video or telephone). Apolonio Schneiders, FNP  Date: 07/10/2022 12:47 PM  Virtual Visit via Video Note   I, Apolonio Schneiders, connected with  Candice [name]  ([id number], November 26, 1960) on 07/10/22 at 12:45 PM EDT by a video-enabled telemedicine application and verified that I am speaking with the correct person using two identifiers.  Location: Patient: Virtual Visit Location Patient:  Home Provider: Virtual Visit Location Provider: Home Office   I discussed the limitations of evaluation and management by telemedicine and the availability of in person appointments. The patient expressed understanding and agreed to proceed.    History of Present Illness: Candice [name] is a 61 y.o. who identifies as a female who was assigned female at birth, and is being seen today after testing positive for COVID.   She took a test today at home and tested positive   She has had symptoms for three days or coughing, sneezing, head congestion, weakness, hot sweats, fever.   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 No rash.  Medications:  Current Outpatient Medications:    atenolol (TENORMIN) 50 MG tablet, TAKE 1 TABLET BY MOUTH EVERY DAY, Disp: 90 tablet, Rfl: 4   colchicine 0.6 MG tablet, TAKE 1 TABLET BY MOUTH EVERY DAY, Disp: 90 tablet, Rfl: 3   Emollient (EUCERIN) lotion, Apply topically onto the skin as needed for dry skin., Disp: , Rfl:    fluconazole (DIFLUCAN) 150 MG tablet, Take 1 tab, repeat in 72 hours if no improvement., Disp: 2 tablet, Rfl: 0    fluticasone (FLONASE) 50 MCG/ACT nasal spray, Place 2 sprays into both nostrils daily., Disp: 16 g, Rfl: 2   hydroxychloroquine (PLAQUENIL) 200 MG tablet, Take 200 mg by mouth 2 (two) times daily., Disp: , Rfl:    levocetirizine (XYZAL) 5 MG tablet, TAKE 1 TABLET BY MOUTH EVERY DAY IN THE EVENING, Disp: 90 tablet, Rfl: 2   nystatin (MYCOSTATIN) 100000 UNIT/ML suspension, Take 5 mLs (500,000 Units total) by mouth 4 (four) times daily., Disp: 120 mL, Rfl: 0   probenecid (BENEMID) 500 MG tablet, Take 500 mg by mouth 2 (two) times daily., Disp: , Rfl:    triamcinolone ointment (KENALOG) 0.1 %, Apply 1 application topically as needed., Disp: 30 g, Rfl: 1   valsartan (DIOVAN) 160 MG tablet, TAKE 1 TABLET BY MOUTH EVERY DAY, Disp: 90 tablet, Rfl: 1  Observations/Objective: Patient is well-developed, well-nourished in no acute distress.  Resting comfortably  at home.  Head is normocephalic, atraumatic.  No labored breathing.  Speech is clear and coherent with logical content.  Patient is alert and oriented at baseline.    Assessment and Plan: 1. COVID-19  - molnupiravir EUA (LAGEVRIO) 200 mg CAPS capsule; Take 4 capsules (800 mg total) by mouth 2 (two) times daily for 5 days.  Dispense: 40 capsule; Refill: 0    May continue Coricidin HBP for symptom management OTC and tylenol as needed   Follow Up Instructions: I discussed the assessment and treatment plan with the patient. The patient was provided an opportunity to ask questions and all were answered. The patient agreed with the plan and demonstrated an understanding of the instructions.  A copy of instructions were sent to the patient via MyChart unless otherwise noted below.    The patient was advised to call back or seek an in-person evaluation if the symptoms worsen or if the condition fails to improve as anticipated.  Time:  I spent 10 minutes with the patient via telehealth technology discussing the above problems/concerns.    Apolonio Schneiders, FNP

## 2022-09-06 ENCOUNTER — Encounter: Payer: Self-pay | Admitting: Family Medicine

## 2022-09-06 ENCOUNTER — Ambulatory Visit: Payer: BC Managed Care – PPO | Admitting: Family Medicine

## 2022-09-06 VITALS — BP 134/86 | HR 65 | Temp 97.9°F | Resp 16 | Wt 254.0 lb

## 2022-09-06 DIAGNOSIS — J40 Bronchitis, not specified as acute or chronic: Secondary | ICD-10-CM | POA: Diagnosis not present

## 2022-09-06 DIAGNOSIS — K1120 Sialoadenitis, unspecified: Secondary | ICD-10-CM

## 2022-09-06 DIAGNOSIS — I8391 Asymptomatic varicose veins of right lower extremity: Secondary | ICD-10-CM

## 2022-09-06 DIAGNOSIS — I872 Venous insufficiency (chronic) (peripheral): Secondary | ICD-10-CM

## 2022-09-06 HISTORY — DX: Asymptomatic varicose veins of right lower extremity: I83.91

## 2022-09-06 MED ORDER — METHYLPREDNISOLONE 4 MG PO TBPK: ORAL_TABLET | ORAL | 0 refills | Status: DC

## 2022-09-06 MED ORDER — AMOXICILLIN-POT CLAVULANATE 875-125 MG PO TABS: 1.0000 | ORAL_TABLET | Freq: Two times a day (BID) | ORAL | 0 refills | Status: AC

## 2022-09-06 NOTE — Patient Instructions (Addendum)
Continue to push fluids, practice good hand hygiene, and cover your mouth if you cough.  If you start having fevers, shaking or shortness of breath, seek immediate care.  OK to take Tylenol 1000 mg (2 extra strength tabs) or 975 mg (3 regular strength tabs) every 6 hours as needed.  If you do not hear anything about your referral in the next 1-2 weeks, call our office and ask for an update.  Let us know if you need anything.

## 2022-09-06 NOTE — Progress Notes (Signed)
Chief Complaint  Patient presents with   Cough    Complains of productive cough for about one week   Facial Swelling    Complains of left sided facial pain    [name] here for URI complaints.  Duration: 1 week  Associated symptoms: sinus congestion, rhinorrhea, wheezing, swollen L parotid, and coughing Denies: sinus pain, itchy watery eyes, ear pain, ear drainage, sore throat, shortness of breath, myalgia, and fevers Treatment to date: Coricidin, Motrin, some left over Augmentin Sick contacts: No Her R parotid was swollen and tender a few mo ago. She is concerned about this and is interested in more workup, she saw Dr. Benjamine Mola a yr ago for a swollen LN.  Skin lesion Patient has a history of varicose veins in her right lower extremity.  They seem to be getting worse.  She is not having any pain or ulcerations.  She is interested in seeing a Hydrographic surveyor.  No significant swelling.  She does wear compression stockings routinely.  Past Medical History:  Diagnosis Date   Allergy    Gout    great right toe   Heart murmur    Hypertension     Objective BP 134/86 (BP Location: Left Arm, Cuff Size: Large)   Pulse 65   Temp 97.9 F (36.6 C) (Oral)   Resp 16   Wt 254 lb (115.2 kg)   SpO2 100%   BMI 43.60 kg/m  General: Awake, alert, appears stated age HEENT: AT, [place], ears patent b/l and TM's neg, nares patent w/o discharge, pharynx pink and without exudates, MMM, TTP and swelling over the left parotid without fluctuance or erythema; there is no TTP over the maxillary or frontal sinuses bilaterally Skin: Nonblanching patches of hyperpigmentation over the distal right lower extremity posteriorly and laterally Neck: No masses or asymmetry Heart: RRR Lungs: Diffuse expiratory wheezes throughout, no accessory muscle use Psych: Age appropriate judgment and insight, normal mood and affect  Parotiditis - Plan: Ambulatory referral to ENT, amoxicillin-clavulanate (AUGMENTIN) 875-125  MG tablet  Wheezy bronchitis - Plan: methylPREDNISolone (MEDROL DOSEPAK) 4 MG TBPK tablet  Asymptomatic varicose veins of right lower extremity - Plan: Ambulatory referral to Vascular Surgery  Venous stasis dermatitis of right lower extremity - Plan: Ambulatory referral to Vascular Surgery  Refer to ENT as this is the second time this is happened, albeit on the opposite side, in the past 3 months.  7 days of amoxicillin clavulanic acid. Medrol Dosepak as prednisone causes her to eat a lot more than usual. Continue to push fluids, practice good hand hygiene, cover mouth when coughing. F/u prn. If starting to experience fevers, shaking, or shortness of breath, seek immediate care. Continue with compression stockings.  Refer to vascular surgery. As above. She also asked about getting blood work.  She has not had a physical in quite some time and will schedule one in the next month or so where we will get fasting labs. Pt voiced understanding and agreement to the plan.  I spent 30 minutes with the patient discussing the above plans in addition to reviewing her chart on the same day of the visit.  [place], DO 09/06/22 4:32 PM

## 2022-09-19 ENCOUNTER — Other Ambulatory Visit: Payer: Self-pay | Admitting: Family Medicine

## 2022-09-19 DIAGNOSIS — Z1231 Encounter for screening mammogram for malignant neoplasm of breast: Secondary | ICD-10-CM

## 2022-09-26 ENCOUNTER — Encounter (INDEPENDENT_AMBULATORY_CARE_PROVIDER_SITE_OTHER): Payer: BC Managed Care – PPO | Admitting: Family Medicine

## 2022-09-26 DIAGNOSIS — K1120 Sialoadenitis, unspecified: Secondary | ICD-10-CM

## 2022-09-26 MED ORDER — AMOXICILLIN-POT CLAVULANATE 875-125 MG PO TABS: 1.0000 | ORAL_TABLET | Freq: Two times a day (BID) | ORAL | 0 refills | Status: DC

## 2022-09-26 NOTE — Telephone Encounter (Signed)

## 2022-10-08 ENCOUNTER — Other Ambulatory Visit: Payer: Self-pay | Admitting: *Deleted

## 2022-10-08 DIAGNOSIS — I8391 Asymptomatic varicose veins of right lower extremity: Secondary | ICD-10-CM

## 2022-10-09 ENCOUNTER — Encounter: Payer: Self-pay | Admitting: Vascular Surgery

## 2022-10-09 ENCOUNTER — Ambulatory Visit: Payer: BC Managed Care – PPO | Admitting: Vascular Surgery

## 2022-10-09 ENCOUNTER — Ambulatory Visit (HOSPITAL_COMMUNITY)
Admission: RE | Admit: 2022-10-09 | Discharge: 2022-10-09 | Disposition: A | Discharge status: Home / Self Care | Payer: BC Managed Care – PPO | Source: Ambulatory Visit | Attending: Vascular Surgery | Admitting: Vascular Surgery

## 2022-10-09 VITALS — BP 150/98 | HR 53 | Temp 98.1°F | Resp 16 | Ht 65.0 in | Wt 254.0 lb

## 2022-10-09 DIAGNOSIS — I872 Venous insufficiency (chronic) (peripheral): Secondary | ICD-10-CM

## 2022-10-09 DIAGNOSIS — I8391 Asymptomatic varicose veins of right lower extremity: Secondary | ICD-10-CM | POA: Insufficient documentation

## 2022-10-09 HISTORY — DX: Venous insufficiency (chronic) (peripheral): I87.2

## 2022-10-09 NOTE — Progress Notes (Signed)
Patient name: Candice [name] MRN: [id number] DOB: 08/17/1961 Sex: female  REASON FOR CONSULT: Right leg symptomatic varicose veins  HPI: Candice [name] is a 61 y.o. female, with history of hypertension that presents for evaluation of possible varicose veins in her lower extremities.  She has noticed discoloration to both legs for the last several years with some leg swelling.  She noted this to her primary care provider who provided a referral here.  She states her mom had venous ulcers.  The patient just started wearing knee-high compression stockings.  No history of DVT.  No significant lower extremity pain.  Past Medical History:  Diagnosis Date   Allergy    Gout    great right toe   Heart murmur    Hypertension     Past Surgical History:  Procedure Laterality Date   OVARIAN CYST REMOVAL Left    20 yrs ago   TUBAL LIGATION      Family History  Problem Relation Age of Onset   Stroke Mother    Kidney disease Mother    Heart disease Sister    Aneurysm Sister    Colon cancer Neg Hx    Colon polyps Neg Hx    Esophageal cancer Neg Hx    Rectal cancer Neg Hx    Stomach cancer Neg Hx    Pancreatic cancer Neg Hx     SOCIAL HISTORY: Social History   Socioeconomic History   Marital status: Single    Spouse name: Not on file   Number of children: Not on file   Years of education: Not on file   Highest education level: Not on file  Occupational History   Not on file  Tobacco Use   Smoking status: Never   Smokeless tobacco: Never  Vaping Use   Vaping Use: Never used  Substance and Sexual Activity   Alcohol use: No   Drug use: No   Sexual activity: Not Currently    Birth control/protection: Surgical  Other Topics Concern   Not on file  Social History Narrative   Not on file   Social Determinants of Health   Financial Resource Strain: Not on file  Food Insecurity: Not on file  Transportation Needs: Not on file  Physical Activity: Not on file  Stress: Not  on file  Social Connections: Not on file  Intimate Partner Violence: Not on file    Allergies  Allergen Reactions   Ace Inhibitors Nausea And Vomiting   Allopurinol Nausea Only    Chills, headache, sore throat, sweats. No rash.     Current Outpatient Medications  Medication Sig Dispense Refill   amoxicillin-clavulanate (AUGMENTIN) 875-125 MG tablet Take 1 tablet by mouth 2 (two) times daily. 20 tablet 0   atenolol (TENORMIN) 50 MG tablet TAKE 1 TABLET BY MOUTH EVERY DAY 90 tablet 4   colchicine 0.6 MG tablet TAKE 1 TABLET BY MOUTH EVERY DAY 90 tablet 3   Emollient (EUCERIN) lotion Apply topically onto the skin as needed for dry skin.     fluticasone (FLONASE) 50 MCG/ACT nasal spray Place 2 sprays into both nostrils daily. 16 g 2   hydroxychloroquine (PLAQUENIL) 200 MG tablet Take 200 mg by mouth 2 (two) times daily.     levocetirizine (XYZAL) 5 MG tablet TAKE 1 TABLET BY MOUTH EVERY DAY IN THE EVENING 90 tablet 2   methylPREDNISolone (MEDROL DOSEPAK) 4 MG TBPK tablet Follow instructions on package. 21 tablet 0   probenecid (BENEMID) 500  MG tablet Take 500 mg by mouth 2 (two) times daily.     triamcinolone ointment (KENALOG) 0.1 % Apply 1 application topically as needed. 30 g 1   valsartan (DIOVAN) 160 MG tablet TAKE 1 TABLET BY MOUTH EVERY DAY 90 tablet 1   No current facility-administered medications for this visit.    REVIEW OF SYSTEMS:  '[X]'$  denotes positive finding, '[ ]'$  denotes negative finding Cardiac  Comments:  Chest pain or chest pressure: x   Shortness of breath upon exertion:    Short of breath when lying flat:    Irregular heart rhythm:        Vascular    Pain in calf, thigh, or hip brought on by ambulation:    Pain in feet at night that wakes you up from your sleep:     Blood clot in your veins:    Leg swelling:  x       Pulmonary    Oxygen at home:    Productive cough:     Wheezing:         Neurologic    Sudden weakness in arms or legs:     Sudden  numbness in arms or legs:     Sudden onset of difficulty speaking or slurred speech:    Temporary loss of vision in one eye:     Problems with dizziness:         Gastrointestinal    Blood in stool:     Vomited blood:         Genitourinary    Burning when urinating:     Blood in urine:        Psychiatric    Major depression:         Hematologic    Bleeding problems:    Problems with blood clotting too easily:        Skin    Rashes or ulcers:        Constitutional    Fever or chills:      PHYSICAL EXAM: There were no vitals filed for this visit.  GENERAL: The patient is a well-nourished female, in no acute distress. The vital signs are documented above. CARDIAC: There is a regular rate and rhythm.  VASCULAR:  Palpable femoral pulses bilaterally Palpable DP pulses bilaterally Bilateral lower extremity discoloration consistent with lipodermatosclerosis from venous insufficiency with no open ulcers PULMONARY: No respiratory distress. ABDOMEN: Soft and non-tender. MUSCULOSKELETAL: There are no major deformities or cyanosis. NEUROLOGIC: No focal weakness or paresthesias are detected. SKIN: There are no ulcers or rashes noted. PSYCHIATRIC: The patient has a normal affect.  DATA:   Lower Venous Reflux Study   Patient Name:  Candice [name]  Date of Exam:   10/09/2022  Medical Rec #: [id number]        Accession #:    [id number]  Date of Birth: [birth year]        Patient Gender: F  Patient Age:   57 years  Exam Location:  [facility]  Procedure:      VAS [place] LOWER EXTREMITY VENOUS REFLUX  Referring Phys: Monica Martinez    ---------------------------------------------------------------------------  -----    Indications: Varicosities, and Edema.    Risk Factors: Obesity.  Performing Technologist: Elta Guadeloupe RVT, RDMS     Examination Guidelines: A complete evaluation includes B-mode imaging,  spectral  Doppler, color Doppler, and power Doppler as  needed of all accessible  portions  of each vessel. Bilateral testing is considered  an integral part of a  complete  examination. Limited examinations for reoccurring indications may be  performed  as noted. The reflux portion of the exam is performed with the patient in  reverse Trendelenburg.  Significant venous reflux is defined as >500 ms in the superficial venous  system, and >1 second in the deep venous system.     Venous Reflux Times  +----------------+---------+------+----------+------------+----------------  ----+  RIGHT          Reflux NoReflux  Reflux  Diameter cmsComments                                         Yes     Time                                      +----------------+---------+------+----------+------------+----------------  ----+  CFV                      yes  >1 second                                    +----------------+---------+------+----------+------------+----------------  ----+  FV prox                   yes  >1 second                                    +----------------+---------+------+----------+------------+----------------  ----+  FV mid          no                                                          +----------------+---------+------+----------+------------+----------------  ----+  FV dist         no                                                          +----------------+---------+------+----------+------------+----------------  ----+  Popliteal                yes  >1 second                                    +----------------+---------+------+----------+------------+----------------  ----+  GSV at SFJ                yes                0.73                           +----------------+---------+------+----------+------------+----------------  ----+  GSV prox thigh  no  0.42                            +----------------+---------+------+----------+------------+----------------  ----+  GSV mid thigh   no                           0.30                           +----------------+---------+------+----------+------------+----------------  ----+  GSV dist thigh  no                           0.36                           +----------------+---------+------+----------+------------+----------------  ----+  GSV at knee     no                           0.30                           +----------------+---------+------+----------+------------+----------------  ----+  GSV prox calf   no                           0.23                           +----------------+---------+------+----------+------------+----------------  ----+  SSV Pop Fossa             yes   >500 ms      0.41                           +----------------+---------+------+----------+------------+----------------  ----+  SSV prox calf             yes   >500 ms      0.56    calcification  from                                                         resolved  superficial                                                      thrombus               +----------------+---------+------+----------+------------+----------------  ----+  AASV proximal   no                           0.51                           thigh                                                                       +----------------+---------+------+----------+------------+----------------  ----+  Summary:  Right:  - No evidence of deep vein thrombosis seen in the right lower extremity,  from the common femoral through the popliteal veins.  - No evidence of superficial venous thrombosis in the right lower  extremity.    - No evidence of superficial venous reflux seen in the right greater  saphenous vein.    - Venous reflux is noted in the right common femoral vein.  - Venous reflux is  noted in the right sapheno-femoral junction.  - Venous reflux is noted in the right femoral vein.  - Venous reflux is noted in the right popliteal vein.  - Venous reflux is noted in the right short saphenous vein.  - There is some calcified intimal thickening in the proximal calf short  saphenous vein consistent with resolved chronic superficial thrombus.    *See table(s) above for measurements and observations.   Electronically signed by Monica Martinez MD on 10/09/2022 at 8:35:04 AM.   Assessment/Plan:  61 year old female presents for evaluation of discoloration and swelling to the bilateral lower extremities.  I reviewed her venous reflux study and discussed that her exam is consistent with chronic venous insufficiency.  I discussed the etiology of venous disease.  Discussed that her right leg reflux study today shows dominant deep venous reflux in the femoral and popliteal vein.  Discussed with deep venous reflux she is not a candidate for endovenous ablation or other intervention.  I have recommended conservative measures with compression stockings, elevation, exercise and weight loss.  She will follow-up with me as needed.  Hopefully with conservative measures this will control her symptoms.   Marty Heck, MD [facility] Office: [phone]

## 2022-10-19 ENCOUNTER — Other Ambulatory Visit: Payer: Self-pay | Admitting: Family Medicine

## 2022-10-19 DIAGNOSIS — J302 Other seasonal allergic rhinitis: Secondary | ICD-10-CM

## 2022-10-29 ENCOUNTER — Ambulatory Visit (INDEPENDENT_AMBULATORY_CARE_PROVIDER_SITE_OTHER): Payer: BC Managed Care – PPO | Admitting: Family Medicine

## 2022-10-29 ENCOUNTER — Other Ambulatory Visit: Payer: Self-pay | Admitting: Family Medicine

## 2022-10-29 ENCOUNTER — Encounter: Payer: Self-pay | Admitting: Family Medicine

## 2022-10-29 VITALS — BP 120/78 | HR 51 | Temp 98.2°F | Ht 64.0 in | Wt 249.5 lb

## 2022-10-29 DIAGNOSIS — Z23 Encounter for immunization: Secondary | ICD-10-CM | POA: Diagnosis not present

## 2022-10-29 DIAGNOSIS — Z0000 Encounter for general adult medical examination without abnormal findings: Secondary | ICD-10-CM | POA: Diagnosis not present

## 2022-10-29 DIAGNOSIS — R799 Abnormal finding of blood chemistry, unspecified: Secondary | ICD-10-CM

## 2022-10-29 DIAGNOSIS — R748 Abnormal levels of other serum enzymes: Secondary | ICD-10-CM

## 2022-10-29 LAB — LIPID PANEL
Cholesterol: 124 mg/dL (ref 0–200)
HDL: 49.7 mg/dL (ref >39.00)
LDL Cholesterol: 62 mg/dL (ref 0–99)
NonHDL: 74.6
Total CHOL/HDL Ratio: 3
Triglycerides: 65 mg/dL (ref 0.0–149.0)
VLDL: 13 mg/dL (ref 0.0–40.0)

## 2022-10-29 LAB — COMPREHENSIVE METABOLIC PANEL
ALT: 11 U/L (ref 0–35)
AST: 16 U/L (ref 0–37)
Albumin: 3.7 g/dL (ref 3.5–5.2)
Alkaline Phosphatase: 119 U/L — ABNORMAL HIGH (ref 39–117)
BUN: 10 mg/dL (ref 6–23)
CO2: 27 mEq/L (ref 19–32)
Calcium: 9.3 mg/dL (ref 8.4–10.5)
Chloride: 106 mEq/L (ref 96–112)
Creatinine, Ser: 0.85 mg/dL (ref 0.40–1.20)
GFR: 72.95 mL/min (ref >60.00)
Glucose, Bld: 65 mg/dL — ABNORMAL LOW (ref 70–99)
Potassium: 3.9 mEq/L (ref 3.5–5.1)
Sodium: 141 mEq/L (ref 135–145)
Total Bilirubin: 0.5 mg/dL (ref 0.2–1.2)
Total Protein: 7.9 g/dL (ref 6.0–8.3)

## 2022-10-29 LAB — CBC
HCT: 34.6 % — ABNORMAL LOW (ref 36.0–46.0)
Hemoglobin: 11.7 g/dL — ABNORMAL LOW (ref 12.0–15.0)
MCHC: 33.8 g/dL (ref 30.0–36.0)
MCV: 81.5 fl (ref 78.0–100.0)
Platelets: 264 10*3/uL (ref 150.0–400.0)
RBC: 4.24 Mil/uL (ref 3.87–5.11)
RDW: 16.5 % — ABNORMAL HIGH (ref 11.5–15.5)
WBC: 4.3 10*3/uL (ref 4.0–10.5)

## 2022-10-29 MED ORDER — HYDROXYCHLOROQUINE SULFATE 200 MG PO TABS: 200.0000 mg | ORAL_TABLET | Freq: Two times a day (BID) | ORAL | 5 refills | Status: DC

## 2022-10-29 NOTE — Patient Instructions (Addendum)
Give [place] 2-3 business days to get the results of your labs back.   Keep the diet clean and stay active.  Please get me a copy of your advanced directive form at your convenience.   Please call Dr. Gentry Fitz office for your pelvic exam: [phone]  Artificial tears like Refresh and Systane may be used for comfort. OK to get generic version. Generally people use them every 2-4 hours, but you can use them as much as you want because there is no medication in it.  Let us know if you need anything.

## 2022-10-29 NOTE — Progress Notes (Signed)
Chief Complaint  Patient presents with   Annual Exam     Well Woman Candice [name] is here for a complete physical.   Her last physical was >1 year ago.  Current diet: in general, diet is improved. Current exercise: none. Weight is intentionally down and she denies fatigue out of ordinary. No LMP recorded. Patient is postmenopausal. Seatbelt? Yes Advanced directive? No  Health Maintenance Pap/HPV- Due Mammogram- Yes Colon cancer screening-Yes Shingrix- No Tetanus- Yes Hep C screening- Yes HIV screening- Yes  Past Medical History:  Diagnosis Date   Allergy    Gout    great right toe   Heart murmur    Hypertension      Past Surgical History:  Procedure Laterality Date   OVARIAN CYST REMOVAL Left    20 yrs ago   TUBAL LIGATION      Medications  Current Outpatient Medications on File Prior to Visit  Medication Sig Dispense Refill   atenolol (TENORMIN) 50 MG tablet TAKE 1 TABLET BY MOUTH EVERY DAY 90 tablet 4   colchicine 0.6 MG tablet TAKE 1 TABLET BY MOUTH EVERY DAY 90 tablet 3   Emollient (EUCERIN) lotion Apply topically onto the skin as needed for dry skin.     fluticasone (FLONASE) 50 MCG/ACT nasal spray Place 2 sprays into both nostrils daily. 16 g 2   hydroxychloroquine (PLAQUENIL) 200 MG tablet Take 200 mg by mouth 2 (two) times daily.     probenecid (BENEMID) 500 MG tablet Take 500 mg by mouth 2 (two) times daily.     triamcinolone ointment (KENALOG) 0.1 % Apply 1 application topically as needed. 30 g 1   valsartan (DIOVAN) 160 MG tablet TAKE 1 TABLET BY MOUTH EVERY DAY 90 tablet 1   Allergies Allergies  Allergen Reactions   Ace Inhibitors Nausea And Vomiting   Allopurinol Nausea Only    Chills, headache, sore throat, sweats. No rash.     Review of Systems: Constitutional:  no unexpected weight changes Eye:  no recent significant change in vision Ear/Nose/Mouth/Throat:  Ears:  no recent change in hearing Nose/Mouth/Throat:  no complaints of nasal  congestion, no sore throat Cardiovascular: no chest pain Respiratory:  no shortness of breath Gastrointestinal:  no abdominal pain, no change in bowel habits GU:  Female: negative for dysuria or pelvic pain Musculoskeletal/Extremities:  no pain of the joints Integumentary (Skin/Breast):  no abnormal skin lesions reported Neurologic:  no headaches Endocrine:  denies fatigue  Exam BP 120/78 (BP Location: Left Arm, Patient Position: Sitting, Cuff Size: Large)   Pulse (!) 51   Temp 98.2 F (36.8 C) (Oral)   Ht '5\' 4"'$  (1.626 m)   Wt 249 lb 8 oz (113.2 kg)   SpO2 98%   BMI 42.83 kg/m  General:  well developed, well nourished, in no apparent distress Skin:  no significant moles, warts, or growths Head:  no masses, lesions, or tenderness Eyes:  pupils equal and round, sclera anicteric without injection Ears:  canals without lesions, TMs shiny without retraction, no obvious effusion, no erythema Nose:  nares patent, mucosa normal, and no drainage  Throat/Pharynx:  lips and gingiva without lesion; tongue and uvula midline; non-inflamed pharynx; no exudates or postnasal drainage Neck: neck supple without adenopathy, thyromegaly, or masses Lungs:  clear to auscultation, breath sounds equal bilaterally, no respiratory distress Cardio:  regular rate and rhythm, no LE edema Abdomen:  abdomen soft, nontender; bowel sounds normal; no masses or organomegaly Genital: Defer to GYN Musculoskeletal:  symmetrical muscle groups noted without atrophy or deformity Extremities:  no clubbing, cyanosis, or edema, no deformities, no skin discoloration Neuro:  gait normal; deep tendon reflexes normal and symmetric Psych: well oriented with normal range of affect and appropriate judgment/insight  Assessment and Plan  Well adult exam - Plan: CBC, Comprehensive metabolic panel, Lipid panel  Need for influenza vaccination - Plan: Flu Vaccine QUAD 6+ mos PF IM (Fluarix Quad PF)   Well 61 y.o.  female. Counseled on diet and exercise. Advanced directive form provided today.  Other orders as above. Needs to schedule pelvic exam with Dr. Talbert Nan and/or her team. Contact # provided today.  Flu shot today.  Follow up in 6 mo. The patient voiced understanding and agreement to the plan.  [place], DO 10/29/22 4:23 PM

## 2022-10-30 ENCOUNTER — Encounter: Payer: BC Managed Care – PPO | Admitting: Family Medicine

## 2022-10-30 ENCOUNTER — Other Ambulatory Visit: Payer: Self-pay | Admitting: Family Medicine

## 2022-11-07 ENCOUNTER — Encounter (INDEPENDENT_AMBULATORY_CARE_PROVIDER_SITE_OTHER): Payer: BC Managed Care – PPO | Admitting: Family Medicine

## 2022-11-07 ENCOUNTER — Encounter: Payer: Self-pay | Admitting: Family Medicine

## 2022-11-07 DIAGNOSIS — K1120 Sialoadenitis, unspecified: Secondary | ICD-10-CM

## 2022-11-07 MED ORDER — AMOXICILLIN-POT CLAVULANATE 875-125 MG PO TABS: 1.0000 | ORAL_TABLET | Freq: Two times a day (BID) | ORAL | 0 refills | Status: DC

## 2022-11-07 NOTE — Telephone Encounter (Signed)

## 2022-11-13 ENCOUNTER — Other Ambulatory Visit (INDEPENDENT_AMBULATORY_CARE_PROVIDER_SITE_OTHER): Payer: BC Managed Care – PPO

## 2022-11-13 DIAGNOSIS — R799 Abnormal finding of blood chemistry, unspecified: Secondary | ICD-10-CM | POA: Diagnosis not present

## 2022-11-13 DIAGNOSIS — R748 Abnormal levels of other serum enzymes: Secondary | ICD-10-CM | POA: Diagnosis not present

## 2022-11-13 LAB — CBC
HCT: 31.5 % — ABNORMAL LOW (ref 36.0–46.0)
Hemoglobin: 10.6 g/dL — ABNORMAL LOW (ref 12.0–15.0)
MCHC: 33.7 g/dL (ref 30.0–36.0)
MCV: 82.2 fl (ref 78.0–100.0)
Platelets: 264 10*3/uL (ref 150.0–400.0)
RBC: 3.83 Mil/uL — ABNORMAL LOW (ref 3.87–5.11)
RDW: 16.8 % — ABNORMAL HIGH (ref 11.5–15.5)
WBC: 5.1 10*3/uL (ref 4.0–10.5)

## 2022-11-13 LAB — HEPATIC FUNCTION PANEL
ALT: 10 U/L (ref 0–35)
AST: 9 U/L (ref 0–37)
Albumin: 3.3 g/dL — ABNORMAL LOW (ref 3.5–5.2)
Alkaline Phosphatase: 105 U/L (ref 39–117)
Bilirubin, Direct: 0.1 mg/dL (ref 0.0–0.3)
Total Bilirubin: 0.4 mg/dL (ref 0.2–1.2)
Total Protein: 7.4 g/dL (ref 6.0–8.3)

## 2022-11-14 ENCOUNTER — Other Ambulatory Visit: Payer: Self-pay | Admitting: *Deleted

## 2022-11-14 ENCOUNTER — Other Ambulatory Visit: Payer: BC Managed Care – PPO

## 2022-11-14 DIAGNOSIS — D649 Anemia, unspecified: Secondary | ICD-10-CM

## 2022-11-14 LAB — IRON,TIBC AND FERRITIN PANEL
%SAT: 25 % (calc) (ref 16–45)
Ferritin: 127 ng/mL (ref 16–288)
Iron: 54 ug/dL (ref 45–160)
TIBC: 213 mcg/dL (calc) — ABNORMAL LOW (ref 250–450)

## 2022-11-15 LAB — PATHOLOGIST SMEAR REVIEW

## 2022-11-16 ENCOUNTER — Other Ambulatory Visit: Payer: Self-pay | Admitting: Family Medicine

## 2022-11-16 DIAGNOSIS — D569 Thalassemia, unspecified: Secondary | ICD-10-CM

## 2022-11-20 ENCOUNTER — Ambulatory Visit
Admission: RE | Admit: 2022-11-20 | Discharge: 2022-11-20 | Disposition: A | Discharge status: Home / Self Care | Payer: BC Managed Care – PPO | Source: Ambulatory Visit | Attending: Family Medicine | Admitting: Family Medicine

## 2022-11-20 ENCOUNTER — Ambulatory Visit: Payer: BC Managed Care – PPO

## 2022-11-20 DIAGNOSIS — Z1231 Encounter for screening mammogram for malignant neoplasm of breast: Secondary | ICD-10-CM

## 2022-11-23 ENCOUNTER — Other Ambulatory Visit: Payer: Self-pay | Admitting: Family Medicine

## 2022-11-26 ENCOUNTER — Inpatient Hospital Stay (HOSPITAL_BASED_OUTPATIENT_CLINIC_OR_DEPARTMENT_OTHER): Payer: BC Managed Care – PPO | Admitting: Hematology & Oncology

## 2022-11-26 ENCOUNTER — Encounter: Payer: Self-pay | Admitting: Hematology & Oncology

## 2022-11-26 ENCOUNTER — Other Ambulatory Visit: Payer: Self-pay

## 2022-11-26 ENCOUNTER — Inpatient Hospital Stay: Payer: BC Managed Care – PPO | Attending: Hematology & Oncology

## 2022-11-26 VITALS — BP 152/92 | HR 59 | Temp 98.0°F | Resp 19 | Ht 64.0 in | Wt 252.0 lb

## 2022-11-26 DIAGNOSIS — D619 Aplastic anemia, unspecified: Secondary | ICD-10-CM | POA: Insufficient documentation

## 2022-11-26 DIAGNOSIS — D563 Thalassemia minor: Secondary | ICD-10-CM

## 2022-11-26 DIAGNOSIS — D509 Iron deficiency anemia, unspecified: Secondary | ICD-10-CM | POA: Insufficient documentation

## 2022-11-26 LAB — RETICULOCYTES
Immature Retic Fract: 24.5 % — ABNORMAL HIGH (ref 2.3–15.9)
RBC.: 3.85 MIL/uL — ABNORMAL LOW (ref 3.87–5.11)
Retic Count, Absolute: 49.3 10*3/uL (ref 19.0–186.0)
Retic Ct Pct: 1.3 % (ref 0.4–3.1)

## 2022-11-26 LAB — CMP (CANCER CENTER ONLY)
ALT: 6 U/L (ref 0–44)
AST: 7 U/L — ABNORMAL LOW (ref 15–41)
Albumin: 3.6 g/dL (ref 3.5–5.0)
Alkaline Phosphatase: 101 U/L (ref 38–126)
Anion gap: 9 (ref 5–15)
BUN: 17 mg/dL (ref 8–23)
CO2: 24 mmol/L (ref 22–32)
Calcium: 9.5 mg/dL (ref 8.9–10.3)
Chloride: 105 mmol/L (ref 98–111)
Creatinine: 0.91 mg/dL (ref 0.44–1.00)
GFR, Estimated: >60 mL/min (ref >60)
Glucose, Bld: 82 mg/dL (ref 70–99)
Potassium: 3.5 mmol/L (ref 3.5–5.1)
Sodium: 138 mmol/L (ref 135–145)
Total Bilirubin: 0.4 mg/dL (ref 0.3–1.2)
Total Protein: 8.5 g/dL — ABNORMAL HIGH (ref 6.5–8.1)

## 2022-11-26 LAB — CBC WITH DIFFERENTIAL (CANCER CENTER ONLY)
Abs Immature Granulocytes: 0.02 10*3/uL (ref 0.00–0.07)
Basophils Absolute: 0 10*3/uL (ref 0.0–0.1)
Basophils Relative: 1 %
Eosinophils Absolute: 0.1 10*3/uL (ref 0.0–0.5)
Eosinophils Relative: 2 %
HCT: 31.4 % — ABNORMAL LOW (ref 36.0–46.0)
Hemoglobin: 10.4 g/dL — ABNORMAL LOW (ref 12.0–15.0)
Immature Granulocytes: 0 %
Lymphocytes Relative: 41 %
Lymphs Abs: 2.4 10*3/uL (ref 0.7–4.0)
MCH: 26.9 pg (ref 26.0–34.0)
MCHC: 33.1 g/dL (ref 30.0–36.0)
MCV: 81.3 fL (ref 80.0–100.0)
Monocytes Absolute: 0.4 10*3/uL (ref 0.1–1.0)
Monocytes Relative: 6 %
Neutro Abs: 2.9 10*3/uL (ref 1.7–7.7)
Neutrophils Relative %: 50 %
Platelet Count: 159 10*3/uL (ref 150–400)
RBC: 3.86 MIL/uL — ABNORMAL LOW (ref 3.87–5.11)
RDW: 15.3 % (ref 11.5–15.5)
WBC Count: 5.9 10*3/uL (ref 4.0–10.5)
nRBC: 0 % (ref 0.0–0.2)

## 2022-11-26 LAB — FERRITIN: Ferritin: 115 ng/mL (ref 11–307)

## 2022-11-26 LAB — SAVE SMEAR(SSMR), FOR PROVIDER SLIDE REVIEW

## 2022-11-26 NOTE — Progress Notes (Signed)
Referral MD  Reason for Referral: Microcytic anemia  Chief Complaint  Patient presents with   New Patient (Initial Visit)  : My blood is low.  HPI: Candice [name] is a very charming 62 year old African-American female.  She is followed by Dr. Nani Ravens.  As always, he is very thorough with his workups.  He has noted that she has been anemic.  Back in December, white cell count was 4.3.  Hemoglobin 11.7.  Platelet count 264,000.  MCV was 82.  A couple weeks ago, her white count was 5.1.  Hemoglobin 10.6.  Platelet count 264,000.  MCV was 82.  She going back to 2018, she had a colonoscopy.  This showed some small diverticulosis.  She had some internal hemorrhoids.  She does not have diabetes.  Her renal function appears to be okay.  She had iron studies that were done a couple weeks ago.  This showed a ferritin of 127 with an iron saturation of 25%.  She does not smoke.  She really does not have any alcoholic beverages.  She does not have any history of sickle cell.  There is no history of thalassemia.  There is no history of anemia in the family.  She is not a vegetarian.  She had a mammogram recently.  Everything looked fine on the mammogram.  There is no weight loss or weight gain.  Overall, I would have to say that her performance status is ECOG 0.   Past Medical History:  Diagnosis Date   Allergy    Gout    great right toe   Heart murmur    Hypertension   :   Past Surgical History:  Procedure Laterality Date   OVARIAN CYST REMOVAL Left    20 yrs ago   TUBAL LIGATION    :   Current Outpatient Medications:    atenolol (TENORMIN) 50 MG tablet, TAKE 1 TABLET BY MOUTH EVERY DAY, Disp: 90 tablet, Rfl: 4   atenolol (TENORMIN) 50 MG tablet, Take 1 tablet by mouth daily., Disp: , Rfl:    colchicine 0.6 MG tablet, TAKE 1 TABLET BY MOUTH EVERY DAY, Disp: 90 tablet, Rfl: 3   Emollient (EUCERIN) lotion, Apply topically onto the skin as needed for dry skin., Disp: , Rfl:     fluticasone (FLONASE) 50 MCG/ACT nasal spray, Place 2 sprays into both nostrils daily., Disp: 16 g, Rfl: 2   hydroxychloroquine (PLAQUENIL) 200 MG tablet, Take 1 tablet (200 mg total) by mouth 2 (two) times daily., Disp: 60 tablet, Rfl: 5   levocetirizine (XYZAL) 5 MG tablet, TAKE 1 TABLET BY MOUTH EVERY DAY IN THE EVENING, Disp: 90 tablet, Rfl: 2   Multiple Vitamins-Minerals (PRESERVISION AREDS 2) CAPS, Take 2 capsules by mouth daily., Disp: , Rfl:    probenecid (BENEMID) 500 MG tablet, Take 500 mg by mouth 2 (two) times daily., Disp: , Rfl:    triamcinolone ointment (KENALOG) 0.1 %, Apply 1 application topically as needed., Disp: 30 g, Rfl: 1   valsartan (DIOVAN) 160 MG tablet, TAKE 1 TABLET BY MOUTH EVERY DAY, Disp: 90 tablet, Rfl: 1:  :   Allergies  Allergen Reactions   Ace Inhibitors Nausea And Vomiting   Allopurinol Nausea Only    Chills, headache, sore throat, sweats.   :   Family History  Problem Relation Age of Onset   Stroke Mother    Kidney disease Mother    Heart disease Sister    Aneurysm Sister    Colon cancer Neg Hx  Colon polyps Neg Hx    Esophageal cancer Neg Hx    Rectal cancer Neg Hx    Stomach cancer Neg Hx    Pancreatic cancer Neg Hx   :   Social History   Socioeconomic History   Marital status: Single    Spouse name: Not on file   Number of children: Not on file   Years of education: Not on file   Highest education level: Not on file  Occupational History   Not on file  Tobacco Use   Smoking status: Never   Smokeless tobacco: Never  Vaping Use   Vaping Use: Never used  Substance and Sexual Activity   Alcohol use: No   Drug use: No   Sexual activity: Not Currently    Birth control/protection: Surgical  Other Topics Concern   Not on file  Social History Narrative   Not on file   Social Determinants of Health   Financial Resource Strain: Not on file  Food Insecurity: Not on file  Transportation Needs: Not on file  Physical Activity:  Not on file  Stress: Not on file  Social Connections: Not on file  Intimate Partner Violence: Not on file  :  Review of Systems  Constitutional: Negative.   HENT: Negative.    Eyes: Negative.   Respiratory: Negative.    Cardiovascular: Negative.   Gastrointestinal: Negative.   Genitourinary: Negative.   Musculoskeletal: Negative.   Skin: Negative.   Neurological: Negative.   Endo/Heme/Allergies: Negative.   Psychiatric/Behavioral: Negative.       Exam: Vital signs show temperature of 98.  Pulse 59.  Blood pressure 152/92.  Weight is 252 pounds.  '@IPVITALS'$ @ Physical Exam Vitals reviewed.  HENT:     Head: Normocephalic and atraumatic.  Eyes:     Pupils: Pupils are equal, round, and reactive to light.  Cardiovascular:     Rate and Rhythm: Normal rate and regular rhythm.     Heart sounds: Normal heart sounds.  Pulmonary:     Effort: Pulmonary effort is normal.     Breath sounds: Normal breath sounds.  Abdominal:     General: Bowel sounds are normal.     Palpations: Abdomen is soft.  Musculoskeletal:        General: No tenderness or deformity. Normal range of motion.     Cervical back: Normal range of motion.  Lymphadenopathy:     Cervical: No cervical adenopathy.  Skin:    General: Skin is warm and dry.     Findings: No erythema or rash.  Neurological:     Mental Status: She is alert and oriented to person, place, and time.  Psychiatric:        Behavior: Behavior normal.        Thought Content: Thought content normal.        Judgment: Judgment normal.     Recent Labs    11/26/22 1345  WBC 5.9  HGB 10.4*  HCT 31.4*  PLT 159    Recent Labs    11/26/22 1345  NA 138  K 3.5  CL 105  CO2 24  GLUCOSE 82  BUN 17  CREATININE 0.91  CALCIUM 9.5    Blood smear review: Normochromic and normocytic population of red blood cells.  I see no target cells.  There are no teardrop cells.  She has no nucleated red blood cells.  There is no rouleaux formation.   White blood cells appear normal in morphology and maturation.  There is  no immature myeloid or lymphoid forms.  There are no hypersegmented polys.  Platelets are adequate in number and size.  Platelets are well granulated.  Pathology: None    Assessment and Plan: Candice [name] is a very charming 62 year old African-American female.  She has mild anemia.  I think the real key is going to be that her corrected reticulocyte count is less than 1.  I know she has normal renal function.  It would be interesting to see what her erythropoietin level is.  I would have to think that this would be on the lower side.  I had to mention that she is on Plaquenil.  I am not sure why she is on Plaquenil.  She says that she thinks she has lupus.  She clearly does not have a hemolytic anemia.  She has a hypoproliferative anemia.  I will check her iron studies even though recently, they appear to be normal.  I suppose that the best thing we could have is a low erythropoietin level.  I cannot find anything on her physical exam that would suggest an underlying malignancy.  Her blood smear certainly is not that impressive for a hematologic malignancy, such as myelodysplasia.  I really do not think that a bone marrow biopsy is going to be needed right now.  She really is not that symptomatic with this anemia.  We will have to await the results from the iron studies and the erythropoietin level.  I really think this will tell us what might be going on.  I will plan to get her back once we have these results back and then we will make come up with a game plan to try to help her anemia.  We will see what a hemoglobin lecture pheresis shows.  I will also send off a thalassemia assay for alpha thalassemia.

## 2022-11-27 LAB — ERYTHROPOIETIN: Erythropoietin: 38.6 m[IU]/mL — ABNORMAL HIGH (ref 2.6–18.5)

## 2022-11-27 LAB — IRON AND IRON BINDING CAPACITY (CC-WL,HP ONLY)
Iron: 53 ug/dL (ref 28–170)
Saturation Ratios: 23 % (ref 10.4–31.8)
TIBC: 230 ug/dL — ABNORMAL LOW (ref 250–450)
UIBC: 177 ug/dL (ref 148–442)

## 2022-11-29 LAB — HGB FRACTIONATION CASCADE

## 2022-11-29 LAB — HGB FRACTIONATION BY HPLC
Hgb A2: 4.6 % — ABNORMAL HIGH (ref 1.8–3.2)
Hgb A: 57.8 % — ABNORMAL LOW (ref 96.4–98.8)
Hgb C: 37.6 % — ABNORMAL HIGH
Hgb E: 0 %
Hgb F: 0 % (ref 0.0–2.0)
Hgb S: 0 %
Hgb Variant: 0 %

## 2022-11-30 ENCOUNTER — Other Ambulatory Visit: Payer: Self-pay | Admitting: *Deleted

## 2022-11-30 ENCOUNTER — Encounter: Payer: Self-pay | Admitting: *Deleted

## 2022-11-30 MED ORDER — FOLIC ACID 1 MG PO TABS: 2.0000 mg | ORAL_TABLET | Freq: Every day | ORAL | 2 refills | Status: DC

## 2022-12-06 ENCOUNTER — Telehealth: Payer: Self-pay | Admitting: *Deleted

## 2022-12-06 NOTE — Telephone Encounter (Signed)
Per scheduling message Lorriane Shire - called patient and was unable to lvm - mailbox full - mailed calendar of upcoming appointment.

## 2022-12-10 ENCOUNTER — Other Ambulatory Visit: Payer: Self-pay | Admitting: Otolaryngology

## 2022-12-10 DIAGNOSIS — M3500 Sicca syndrome, unspecified: Secondary | ICD-10-CM

## 2022-12-18 ENCOUNTER — Ambulatory Visit
Admission: RE | Admit: 2022-12-18 | Discharge: 2022-12-18 | Disposition: A | Discharge status: Home / Self Care | Payer: BC Managed Care – PPO | Source: Ambulatory Visit | Attending: Otolaryngology | Admitting: Otolaryngology

## 2022-12-18 DIAGNOSIS — M3500 Sicca syndrome, unspecified: Secondary | ICD-10-CM

## 2022-12-18 MED ORDER — IOPAMIDOL (ISOVUE-300) INJECTION 61%
75.0000 mL | Freq: Once | INTRAVENOUS | Status: AC | PRN
  Administered 2022-12-18: 75 mL via INTRAVENOUS

## 2022-12-26 ENCOUNTER — Other Ambulatory Visit: Payer: Self-pay | Admitting: Family Medicine

## 2022-12-31 LAB — ALPHA-THALASSEMIA GENOTYPR

## 2023-01-07 ENCOUNTER — Other Ambulatory Visit: Payer: BC Managed Care – PPO

## 2023-01-08 ENCOUNTER — Other Ambulatory Visit: Payer: Self-pay | Admitting: Family Medicine

## 2023-01-08 ENCOUNTER — Encounter: Payer: Self-pay | Admitting: Family Medicine

## 2023-01-08 DIAGNOSIS — M109 Gout, unspecified: Secondary | ICD-10-CM

## 2023-01-08 DIAGNOSIS — M1A9XX0 Chronic gout, unspecified, without tophus (tophi): Secondary | ICD-10-CM

## 2023-01-14 ENCOUNTER — Other Ambulatory Visit (HOSPITAL_COMMUNITY): Payer: Self-pay | Admitting: Nephrology

## 2023-01-14 DIAGNOSIS — I129 Hypertensive chronic kidney disease with stage 1 through stage 4 chronic kidney disease, or unspecified chronic kidney disease: Secondary | ICD-10-CM

## 2023-01-14 DIAGNOSIS — R809 Proteinuria, unspecified: Secondary | ICD-10-CM

## 2023-01-28 ENCOUNTER — Inpatient Hospital Stay: Payer: BC Managed Care – PPO | Admitting: Medical Oncology

## 2023-01-28 ENCOUNTER — Inpatient Hospital Stay: Payer: BC Managed Care – PPO | Attending: Hematology & Oncology

## 2023-01-28 VITALS — BP 154/91 | HR 52 | Resp 17 | Ht 64.0 in | Wt 247.0 lb

## 2023-01-28 DIAGNOSIS — D563 Thalassemia minor: Secondary | ICD-10-CM

## 2023-01-28 LAB — CMP (CANCER CENTER ONLY)
ALT: 11 U/L (ref 0–44)
AST: 18 U/L (ref 15–41)
Albumin: 3.6 g/dL (ref 3.5–5.0)
Alkaline Phosphatase: 117 U/L (ref 38–126)
Anion gap: 10 (ref 5–15)
BUN: 14 mg/dL (ref 8–23)
CO2: 24 mmol/L (ref 22–32)
Calcium: 9.7 mg/dL (ref 8.9–10.3)
Chloride: 106 mmol/L (ref 98–111)
Creatinine: 0.88 mg/dL (ref 0.44–1.00)
GFR, Estimated: >60 mL/min (ref >60)
Glucose, Bld: 88 mg/dL (ref 70–99)
Potassium: 4.1 mmol/L (ref 3.5–5.1)
Sodium: 140 mmol/L (ref 135–145)
Total Bilirubin: 0.5 mg/dL (ref 0.3–1.2)
Total Protein: 8.1 g/dL (ref 6.5–8.1)

## 2023-01-28 LAB — CBC WITH DIFFERENTIAL (CANCER CENTER ONLY)
Abs Immature Granulocytes: 0.03 10*3/uL (ref 0.00–0.07)
Basophils Absolute: 0 10*3/uL (ref 0.0–0.1)
Basophils Relative: 1 %
Eosinophils Absolute: 0.1 10*3/uL (ref 0.0–0.5)
Eosinophils Relative: 2 %
HCT: 30.1 % — ABNORMAL LOW (ref 36.0–46.0)
Hemoglobin: 10.2 g/dL — ABNORMAL LOW (ref 12.0–15.0)
Immature Granulocytes: 1 %
Lymphocytes Relative: 35 %
Lymphs Abs: 1.6 10*3/uL (ref 0.7–4.0)
MCH: 26.6 pg (ref 26.0–34.0)
MCHC: 33.9 g/dL (ref 30.0–36.0)
MCV: 78.4 fL — ABNORMAL LOW (ref 80.0–100.0)
Monocytes Absolute: 0.4 10*3/uL (ref 0.1–1.0)
Monocytes Relative: 8 %
Neutro Abs: 2.5 10*3/uL (ref 1.7–7.7)
Neutrophils Relative %: 53 %
Platelet Count: 200 10*3/uL (ref 150–400)
RBC: 3.84 MIL/uL — ABNORMAL LOW (ref 3.87–5.11)
RDW: 15.5 % (ref 11.5–15.5)
WBC Count: 4.6 10*3/uL (ref 4.0–10.5)
nRBC: 0 % (ref 0.0–0.2)

## 2023-01-28 LAB — IRON AND IRON BINDING CAPACITY (CC-WL,HP ONLY)
Iron: 53 ug/dL (ref 28–170)
Saturation Ratios: 24 % (ref 10.4–31.8)
TIBC: 225 ug/dL — ABNORMAL LOW (ref 250–450)
UIBC: 172 ug/dL (ref 148–442)

## 2023-01-28 LAB — RETICULOCYTES
Immature Retic Fract: 24 % — ABNORMAL HIGH (ref 2.3–15.9)
RBC.: 3.9 MIL/uL (ref 3.87–5.11)
Retic Count, Absolute: 52.3 10*3/uL (ref 19.0–186.0)
Retic Ct Pct: 1.3 % (ref 0.4–3.1)

## 2023-01-28 LAB — FERRITIN: Ferritin: 97 ng/mL (ref 11–307)

## 2023-01-28 NOTE — Progress Notes (Signed)
Chief Complaint: Patient was seen in consultation today for non-focal renal biopsy  Referring Physician(s): Singh,Vikas  Supervising Physician: {Supervising Physician:[id number]}  Patient Status: [facility] - Out-pt  History of Present Illness: Candice [name] is a 62 y.o. female with a medical history significant for HTN, lupus, sickle cell disease, Sjogren's, gout and anemia. She was referred to Nephrology February 2024 for evaluation and management of worsening proteinuria. Secondary lab work up has been unrevealing.   Interventional Radiology has been asked to evaluate this patient for an image-guided non-focal renal biopsy for further work up.    Past Medical History:  Diagnosis Date   Allergy    Gout    great right toe   Heart murmur    Hypertension     Past Surgical History:  Procedure Laterality Date   OVARIAN CYST REMOVAL Left    20 yrs ago   TUBAL LIGATION      Allergies: Ace inhibitors and Allopurinol  Medications: Prior to Admission medications   Medication Sig Start Date End Date Taking? Authorizing Provider  atenolol (TENORMIN) 50 MG tablet TAKE 1 TABLET BY MOUTH EVERY DAY 12/26/22   Shelda Pal, DO  colchicine 0.6 MG tablet TAKE 1 TABLET BY MOUTH EVERY DAY 01/18/20   Saguier, Percell Miller, PA-C  Emollient (EUCERIN) lotion Apply topically onto the skin as needed for dry skin.    [provider]  fluticasone (FLONASE) 50 MCG/ACT nasal spray Place 2 sprays into both nostrils daily. 05/19/19   Shelda Pal, DO  folic acid (FOLVITE) 1 MG tablet Take 2 tablets (2 mg total) by mouth daily. 11/30/22   Volanda Napoleon, MD  hydroxychloroquine (PLAQUENIL) 200 MG tablet Take 1 tablet (200 mg total) by mouth 2 (two) times daily. 10/29/22   Shelda Pal, DO  levocetirizine (XYZAL) 5 MG tablet TAKE 1 TABLET BY MOUTH EVERY DAY IN THE EVENING 10/19/22   Wendling, Crosby Oyster, DO  Multiple Vitamins-Minerals (PRESERVISION AREDS 2) CAPS Take 2  capsules by mouth daily. 11/26/22   [provider]  probenecid (BENEMID) 500 MG tablet Take 500 mg by mouth 2 (two) times daily. 09/02/19   [provider]  triamcinolone ointment (KENALOG) 0.1 % Apply 1 application topically as needed. 02/07/21   Shelda Pal, DO  valsartan (DIOVAN) 160 MG tablet TAKE 1 TABLET BY MOUTH EVERY DAY 10/30/22   Wendling, Crosby Oyster, DO     Family History  Problem Relation Age of Onset   Stroke Mother    Kidney disease Mother    Heart disease Sister    Aneurysm Sister    Colon cancer Neg Hx    Colon polyps Neg Hx    Esophageal cancer Neg Hx    Rectal cancer Neg Hx    Stomach cancer Neg Hx    Pancreatic cancer Neg Hx     Social History   Socioeconomic History   Marital status: Single    Spouse name: Not on file   Number of children: Not on file   Years of education: Not on file   Highest education level: Not on file  Occupational History   Not on file  Tobacco Use   Smoking status: Never   Smokeless tobacco: Never  Vaping Use   Vaping Use: Never used  Substance and Sexual Activity   Alcohol use: No   Drug use: No   Sexual activity: Not Currently    Birth control/protection: Surgical  Other Topics Concern   Not on file  Social History Narrative   Not on file   Social Determinants of Health   Financial Resource Strain: Not on file  Food Insecurity: Not on file  Transportation Needs: Not on file  Physical Activity: Not on file  Stress: Not on file  Social Connections: Not on file    Review of Systems: A 12 point ROS discussed and pertinent positives are indicated in the HPI above.  All other systems are negative.  Review of Systems  Vital Signs: There were no vitals taken for this visit.  Physical Exam  Imaging: No results found.  Labs:  CBC: Recent Labs    10/29/22 1339 11/13/22 0826 11/26/22 1345 01/28/23 1157  WBC 4.3 5.1 5.9 4.6  HGB 11.7* 10.6* 10.4* 10.2*  HCT 34.6* 31.5* 31.4*  30.1*  PLT 264.0 264.0 159 200    COAGS: No results for input(s): "INR", "APTT" in the last 8760 hours.  BMP: Recent Labs    10/29/22 1339 11/26/22 1345  NA 141 138  K 3.9 3.5  CL 106 105  CO2 27 24  GLUCOSE 65* 82  BUN 10 17  CALCIUM 9.3 9.5  CREATININE 0.85 0.91  GFRNONAA  --  >60    LIVER FUNCTION TESTS: Recent Labs    10/29/22 1339 11/13/22 0826 11/26/22 1345  BILITOT 0.5 0.4 0.4  AST 16 9 7*  ALT 11 10 6   ALKPHOS 119* 105 101  PROT 7.9 7.4 8.5*  ALBUMIN 3.7 3.3* 3.6    TUMOR MARKERS: No results for input(s): "AFPTM", "CEA", "CA199", "CHROMGRNA" in the last 8760 hours.  Assessment and Plan:  Worsening proteinuria: Candice [name], 62 year old female, presents today to the [facility] department for an image-guided non-focal renal biopsy.  Risks and benefits of this procedure were discussed with the patient and/or patient's family including, but not limited to bleeding, infection, damage to adjacent structures or low yield requiring additional tests.  All of the questions were answered and there is agreement to proceed. She has been NPO. She is a full code. She does not take any blood-thinning medications.   Consent signed and in chart.  Thank you for this interesting consult.  I greatly enjoyed meeting Candice [name] and look forward to participating in their care.  A copy of this report was sent to the requesting provider on this date.  Electronically Signed: Soyla Dryer, AGACNP-BC [phone] 01/28/2023, 12:56 PM   I spent a total of  30 Minutes   in face to face in clinical consultation, greater than 50% of which was counseling/coordinating care for non-focal renal biopsy

## 2023-01-28 NOTE — Progress Notes (Signed)
Hematology and Oncology Follow Up Visit  Candice [name] [id number] December 11, 1960 62 y.o. 01/28/2023  Past Medical History:  Diagnosis Date   Allergy    Gout    great right toe   Heart murmur    Hypertension     Principle Diagnosis:  Thalassemia Minor  Current Therapy:   Observation  PriorTherapy:   None   Interim History:  Candice [name] is back for follow-up for her Thalassemia Minor:   She reports that she has been a bit overwhelmed recently with life and health stressors. She has been having jaw pain that was recently found to may be secondary to possible Sjogren's disease. 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 She also has been working with her rheumatologist and has an upcoming renal biopsy on Wednesday. She is hoping this is normal.    Currently she expresses being chronically fatigued. She thinks this may be worsening a bit and is taking naps more often than normal.   She also reports frustrations with multiple health care providers and notes that she has transferred her care to new providers in a variety of areas: Rheumatology, PCP, ENT.  Wt Readings from Last 3 Encounters:  01/28/23 247 lb (112 kg)  11/26/22 252 lb (114.3 kg)  10/29/22 249 lb 8 oz (113.2 kg)     Medications:   Current Outpatient Medications:    atenolol (TENORMIN) 50 MG tablet, TAKE 1 TABLET BY MOUTH EVERY DAY, Disp: 90 tablet, Rfl: 4   colchicine 0.6 MG tablet, TAKE 1 TABLET BY MOUTH EVERY DAY, Disp: 90 tablet, Rfl: 3   Emollient (EUCERIN) lotion, Apply topically onto the skin as needed for dry skin., Disp: , Rfl:    fluticasone (FLONASE) 50 MCG/ACT nasal spray, Place 2 sprays into both nostrils daily., Disp: 16 g, Rfl: 2   folic acid (FOLVITE) 1 MG tablet, Take 2 tablets (2 mg total) by mouth daily., Disp: 60  tablet, Rfl: 2   hydroxychloroquine (PLAQUENIL) 200 MG tablet, Take 1 tablet (200 mg total) by mouth 2 (two) times daily., Disp: 60 tablet, Rfl: 5   levocetirizine (XYZAL) 5 MG tablet, TAKE 1 TABLET BY MOUTH EVERY DAY IN THE EVENING, Disp: 90 tablet, Rfl: 2   Multiple Vitamins-Minerals (PRESERVISION AREDS 2) CAPS, Take 2 capsules by mouth daily., Disp: , Rfl:    probenecid (BENEMID) 500 MG tablet, Take 500 mg by mouth 2 (two) times daily., Disp: , Rfl:    triamcinolone ointment (KENALOG) 0.1 %, Apply 1 application topically as needed., Disp: 30 g, Rfl: 1   valsartan (DIOVAN) 160 MG tablet, TAKE 1 TABLET BY MOUTH EVERY DAY, Disp: 90 tablet, Rfl: 1  Allergies:  Allergies  Allergen Reactions   Ace Inhibitors Nausea And Vomiting   Allopurinol Nausea Only    Chills, headache, sore throat, sweats.     Past Medical History, Surgical history, Social history, and Family History were reviewed and updated.  Review of Systems: Review of Systems  Constitutional:  Positive for fatigue. Negative for unexpected weight change.  HENT:   Negative for nosebleeds.   Gastrointestinal:  Negative for blood in stool.  Genitourinary:  Negative for vaginal bleeding.      Physical Exam:  height is 5\' 4"  (1.626 m) and weight is 247 lb (112 kg). Her blood pressure is 154/91 (abnormal) and her  pulse is 52 (abnormal). Her respiration is 17 and oxygen saturation is 100%.   Physical Exam Vitals and nursing note reviewed.  Constitutional:      General: She is not in acute distress.    Appearance: Normal appearance. She is not ill-appearing or toxic-appearing.  Cardiovascular:     Rate and Rhythm: Normal rate and regular rhythm.     Heart sounds: Normal heart sounds.  Pulmonary:     Effort: Pulmonary effort is normal.     Breath sounds: Normal breath sounds.  Musculoskeletal:     Cervical back: Normal range of motion and neck supple.  Lymphadenopathy:     Cervical: No cervical adenopathy.  Skin:     General: Skin is warm.     Coloration: Skin is not pale.     Findings: No rash.  Neurological:     General: No focal deficit present.     Mental Status: She is alert and oriented to person, place, and time.       Lab Results  Component Value Date   WBC 4.6 01/28/2023   HGB 10.2 (L) 01/28/2023   HCT 30.1 (L) 01/28/2023   MCV 78.4 (L) 01/28/2023   PLT 200 01/28/2023     Chemistry      Component Value Date/Time   NA 138 11/26/2022 1345   NA 145 (H) 10/23/2017 1554   K 3.5 11/26/2022 1345   CL 105 11/26/2022 1345   CO2 24 11/26/2022 1345   BUN 17 11/26/2022 1345   BUN 15 10/23/2017 1554   CREATININE 0.91 11/26/2022 1345   CREATININE 1.04 05/18/2016 0928      Component Value Date/Time   CALCIUM 9.5 11/26/2022 1345   ALKPHOS 101 11/26/2022 1345   AST 7 (L) 11/26/2022 1345   ALT 6 11/26/2022 1345   BILITOT 0.4 11/26/2022 1345      Impression and Plan: 1. Thalassemia minor - CBC with Differential (Cancer Center Only); Future - Reticulocytes; Future - Ferritin; Future - Hgb Fractionation Cascade    Candice [name] does not appear to be having a good day. She expresses frustrations with our team about the medical emergency we were tending to that caused her appointment to be slightly delayed. Apologies offered by team and myself.   Understandably she expresses feelings of being overwhelmed with her health demands at the moment. She has made many changes in her care providers. She also mentions completing extensive research on many labs, medications and conditions. I think she is probably expressing these feelings outwardly. Given this I have taken plenty of time today to go over all of her labs including the reason for obtaining the labs, what her results mean, and what my plan is for further visits. I am hoping that she will feel more secure in her care and be able to take the stress of her Thalassemia minor off of her list at the moment so she can better focus on her upcoming  procedures and specialist visits.   Specifically I discussed that her labs that we are following appear to be stable overall. Reviewed her last labs including normal iron levels. I have encouraged her to continue her once daily folic acid tablet as well as folic acid rich diet given her intolerance of two folic acid tablets per day. Her Erythropoietin level is elevated above normal but lower than 100- she may benefit from erythropoietin. I am curious to see if she does have autoimmune kidney disease. This would certainly help support our decision  to trial EPO in the future. Our goal at the moment is for her to return in 2 months for MD follow up and labs. At this time we should have the results of her rheumatologic work up- asked that she have her specialist send [place] these reports.     I spent 25 minutes dedicated to the care of this patient (face to face and non-face to face) on the date of this encounter to include the items discussed above.   Nelwyn Salisbury PA-C 3/18/20241:07 PM

## 2023-01-29 ENCOUNTER — Other Ambulatory Visit: Payer: Self-pay | Admitting: Student

## 2023-01-29 DIAGNOSIS — R809 Proteinuria, unspecified: Secondary | ICD-10-CM

## 2023-01-29 LAB — ERYTHROPOIETIN: Erythropoietin: 39.2 m[IU]/mL — ABNORMAL HIGH (ref 2.6–18.5)

## 2023-01-30 ENCOUNTER — Other Ambulatory Visit (HOSPITAL_COMMUNITY): Payer: Self-pay | Admitting: Nephrology

## 2023-01-30 ENCOUNTER — Ambulatory Visit (HOSPITAL_COMMUNITY)
Admission: RE | Admit: 2023-01-30 | Discharge: 2023-01-30 | Disposition: A | Discharge status: Home / Self Care | Payer: BC Managed Care – PPO | Source: Ambulatory Visit | Attending: Nephrology | Admitting: Nephrology

## 2023-01-30 DIAGNOSIS — R809 Proteinuria, unspecified: Secondary | ICD-10-CM

## 2023-01-30 DIAGNOSIS — M109 Gout, unspecified: Secondary | ICD-10-CM | POA: Diagnosis not present

## 2023-01-30 DIAGNOSIS — D571 Sickle-cell disease without crisis: Secondary | ICD-10-CM | POA: Diagnosis not present

## 2023-01-30 DIAGNOSIS — M3500 Sicca syndrome, unspecified: Secondary | ICD-10-CM | POA: Diagnosis not present

## 2023-01-30 DIAGNOSIS — I129 Hypertensive chronic kidney disease with stage 1 through stage 4 chronic kidney disease, or unspecified chronic kidney disease: Secondary | ICD-10-CM

## 2023-01-30 DIAGNOSIS — I10 Essential (primary) hypertension: Secondary | ICD-10-CM | POA: Insufficient documentation

## 2023-01-30 LAB — BASIC METABOLIC PANEL
Anion gap: 8 (ref 5–15)
BUN: 15 mg/dL (ref 8–23)
CO2: 24 mmol/L (ref 22–32)
Calcium: 8.8 mg/dL — ABNORMAL LOW (ref 8.9–10.3)
Chloride: 107 mmol/L (ref 98–111)
Creatinine, Ser: 0.92 mg/dL (ref 0.44–1.00)
GFR, Estimated: >60 mL/min (ref >60)
Glucose, Bld: 95 mg/dL (ref 70–99)
Potassium: 3.7 mmol/L (ref 3.5–5.1)
Sodium: 139 mmol/L (ref 135–145)

## 2023-01-30 LAB — CBC
HCT: 30.7 % — ABNORMAL LOW (ref 36.0–46.0)
Hemoglobin: 10.5 g/dL — ABNORMAL LOW (ref 12.0–15.0)
MCH: 26.9 pg (ref 26.0–34.0)
MCHC: 34.2 g/dL (ref 30.0–36.0)
MCV: 78.5 fL — ABNORMAL LOW (ref 80.0–100.0)
Platelets: 231 10*3/uL (ref 150–400)
RBC: 3.91 MIL/uL (ref 3.87–5.11)
RDW: 15.9 % — ABNORMAL HIGH (ref 11.5–15.5)
WBC: 4.7 10*3/uL (ref 4.0–10.5)
nRBC: 0 % (ref 0.0–0.2)

## 2023-01-30 LAB — PROTIME-INR
INR: 1 (ref 0.8–1.2)
Prothrombin Time: 13.1 seconds (ref 11.4–15.2)

## 2023-01-30 MED ORDER — MIDAZOLAM HCL 2 MG/2ML IJ SOLN
INTRAMUSCULAR | Status: AC
  Filled 2023-01-30: qty 2

## 2023-01-30 MED ORDER — HYDRALAZINE HCL 20 MG/ML IJ SOLN
INTRAMUSCULAR | Status: AC | PRN
  Administered 2023-01-30: 10 mg via INTRAVENOUS

## 2023-01-30 MED ORDER — MIDAZOLAM HCL 2 MG/2ML IJ SOLN
INTRAMUSCULAR | Status: AC | PRN
  Administered 2023-01-30: 1 mg via INTRAVENOUS
  Administered 2023-01-30: .5 mg via INTRAVENOUS
  Administered 2023-01-30: 1 mg via INTRAVENOUS
  Administered 2023-01-30: .5 mg via INTRAVENOUS

## 2023-01-30 MED ORDER — FENTANYL CITRATE (PF) 100 MCG/2ML IJ SOLN
INTRAMUSCULAR | Status: AC
  Filled 2023-01-30: qty 2

## 2023-01-30 MED ORDER — FENTANYL CITRATE (PF) 100 MCG/2ML IJ SOLN
INTRAMUSCULAR | Status: AC | PRN
  Administered 2023-01-30: 25 ug via INTRAVENOUS
  Administered 2023-01-30: 50 ug via INTRAVENOUS
  Administered 2023-01-30: 25 ug via INTRAVENOUS

## 2023-01-30 MED ORDER — LORAZEPAM 2 MG/ML IJ SOLN
INTRAMUSCULAR | Status: AC | PRN
  Administered 2023-01-30: 1 mg via INTRAVENOUS

## 2023-01-30 MED ORDER — HYDRALAZINE HCL 20 MG/ML IJ SOLN
INTRAMUSCULAR | Status: AC
  Filled 2023-01-30: qty 1

## 2023-01-30 MED ORDER — SODIUM CHLORIDE 0.9 % IV SOLN: INTRAVENOUS | Status: DC

## 2023-01-30 NOTE — Procedures (Signed)
Interventional Radiology Procedure Note  Procedure: CT guided random renal biopsy  Indication: Worsening Proteinuria  Findings: Please refer to procedural dictation for full description.  Complications: None  EBL: < 10 mL  Miachel Roux, MD [phone]

## 2023-01-31 LAB — HGB FRACTIONATION CASCADE

## 2023-01-31 LAB — HGB FRACTIONATION BY HPLC
Hgb A2: 3.6 % — ABNORMAL HIGH (ref 1.8–3.2)
Hgb A: 56.9 % — ABNORMAL LOW (ref 96.4–98.8)
Hgb C: 39.5 % — ABNORMAL HIGH
Hgb E: 0 %
Hgb F: 0 % (ref 0.0–2.0)
Hgb S: 0 %
Hgb Variant: 0 %

## 2023-02-01 ENCOUNTER — Encounter (HOSPITAL_COMMUNITY): Payer: Self-pay

## 2023-02-01 LAB — SURGICAL PATHOLOGY

## 2023-04-01 ENCOUNTER — Inpatient Hospital Stay: Payer: BC Managed Care – PPO

## 2023-04-01 ENCOUNTER — Ambulatory Visit: Payer: BC Managed Care – PPO | Admitting: Family

## 2023-04-01 ENCOUNTER — Other Ambulatory Visit: Payer: Self-pay | Admitting: Family

## 2023-04-01 DIAGNOSIS — D563 Thalassemia minor: Secondary | ICD-10-CM

## 2023-04-01 DIAGNOSIS — D509 Iron deficiency anemia, unspecified: Secondary | ICD-10-CM

## 2023-04-02 ENCOUNTER — Inpatient Hospital Stay (HOSPITAL_BASED_OUTPATIENT_CLINIC_OR_DEPARTMENT_OTHER): Payer: BC Managed Care – PPO | Admitting: Medical Oncology

## 2023-04-02 ENCOUNTER — Encounter: Payer: Self-pay | Admitting: Medical Oncology

## 2023-04-02 ENCOUNTER — Inpatient Hospital Stay: Payer: BC Managed Care – PPO | Attending: Hematology & Oncology

## 2023-04-02 VITALS — BP 136/78 | HR 69 | Temp 98.4°F | Resp 18 | Wt 253.1 lb

## 2023-04-02 DIAGNOSIS — D563 Thalassemia minor: Secondary | ICD-10-CM | POA: Diagnosis present

## 2023-04-02 DIAGNOSIS — D509 Iron deficiency anemia, unspecified: Secondary | ICD-10-CM

## 2023-04-02 LAB — CBC WITH DIFFERENTIAL (CANCER CENTER ONLY)
Abs Immature Granulocytes: 0.04 10*3/uL (ref 0.00–0.07)
Basophils Absolute: 0 10*3/uL (ref 0.0–0.1)
Basophils Relative: 0 %
Eosinophils Absolute: 0.1 10*3/uL (ref 0.0–0.5)
Eosinophils Relative: 2 %
HCT: 29.8 % — ABNORMAL LOW (ref 36.0–46.0)
Hemoglobin: 10.1 g/dL — ABNORMAL LOW (ref 12.0–15.0)
Immature Granulocytes: 1 %
Lymphocytes Relative: 31 %
Lymphs Abs: 1.9 10*3/uL (ref 0.7–4.0)
MCH: 26.9 pg (ref 26.0–34.0)
MCHC: 33.9 g/dL (ref 30.0–36.0)
MCV: 79.3 fL — ABNORMAL LOW (ref 80.0–100.0)
Monocytes Absolute: 0.4 10*3/uL (ref 0.1–1.0)
Monocytes Relative: 6 %
Neutro Abs: 3.7 10*3/uL (ref 1.7–7.7)
Neutrophils Relative %: 60 %
Platelet Count: 226 10*3/uL (ref 150–400)
RBC: 3.76 MIL/uL — ABNORMAL LOW (ref 3.87–5.11)
RDW: 14.8 % (ref 11.5–15.5)
WBC Count: 6.1 10*3/uL (ref 4.0–10.5)
nRBC: 0 % (ref 0.0–0.2)

## 2023-04-02 LAB — RETICULOCYTES
Immature Retic Fract: 26.8 % — ABNORMAL HIGH (ref 2.3–15.9)
RBC.: 3.74 MIL/uL — ABNORMAL LOW (ref 3.87–5.11)
Retic Count, Absolute: 67.3 10*3/uL (ref 19.0–186.0)
Retic Ct Pct: 1.8 % (ref 0.4–3.1)

## 2023-04-02 LAB — FERRITIN: Ferritin: 81 ng/mL (ref 11–307)

## 2023-04-02 NOTE — Progress Notes (Signed)
Hematology and Oncology Follow Up Visit  Candice [name] [id number] 15-Jul-1961 62 y.o. 04/05/2023  Past Medical History:  Diagnosis Date   Allergy    Gout    great right toe   Heart murmur    Hypertension     Principle Diagnosis:  Thalassemia Minor  Current Therapy:   Observation  PriorTherapy:   None   Interim History:  Candice [name] is back for follow-up for her Thalassemia Minor:   Since our last visit she had a renal biopsy performed. This did not show any significant abnormality per patient. She continues to be seen by her rheumatologist.   Overall she reports that she is doing well. No major bleeding or bruising episodes. Chronic fatigue is stable.   Iron studies from today show a ferritin of 81. Iron saturation of 15%, iron of 34.    Wt Readings from Last 3 Encounters:  04/02/23 253 lb 1.9 oz (114.8 kg)  01/30/23 245 lb (111.1 kg)  01/28/23 247 lb (112 kg)     Medications:   Current Outpatient Medications:    atenolol (TENORMIN) 50 MG tablet, TAKE 1 TABLET BY MOUTH EVERY DAY, Disp: 90 tablet, Rfl: 4   colchicine 0.6 MG tablet, TAKE 1 TABLET BY MOUTH EVERY DAY, Disp: 90 tablet, Rfl: 3   Emollient (EUCERIN) lotion, Apply topically onto the skin as needed for dry skin., Disp: , Rfl:    fluticasone (FLONASE) 50 MCG/ACT nasal spray, Place 2 sprays into both nostrils daily., Disp: 16 g, Rfl: 2   folic acid (FOLVITE) 1 MG tablet, Take 2 tablets (2 mg total) by mouth daily., Disp: 60 tablet, Rfl: 2   hydroxychloroquine (PLAQUENIL) 200 MG tablet, Take 1 tablet (200 mg total) by mouth 2 (two) times daily., Disp: 60 tablet, Rfl: 5   levocetirizine (XYZAL) 5 MG tablet, TAKE 1 TABLET BY MOUTH EVERY DAY IN THE EVENING, Disp: 90 tablet, Rfl: 2   Multiple Vitamins-Minerals (PRESERVISION AREDS 2) CAPS, Take 2 capsules by mouth daily., Disp: , Rfl:    probenecid (BENEMID) 500 MG tablet, Take 500 mg by mouth 2 (two) times daily., Disp: , Rfl:    triamcinolone ointment (KENALOG) 0.1  %, Apply 1 application topically as needed., Disp: 30 g, Rfl: 1   valsartan (DIOVAN) 160 MG tablet, TAKE 1 TABLET BY MOUTH EVERY DAY, Disp: 90 tablet, Rfl: 1  Allergies:  Allergies  Allergen Reactions   Ace Inhibitors Nausea And Vomiting   Allopurinol Nausea Only    Chills, headache, sore throat, sweats.     Past Medical History, Surgical history, Social history, and Family History were reviewed and updated.  Review of Systems: Review of Systems  Constitutional:  Positive for fatigue. Negative for unexpected weight change.  HENT:   Negative for nosebleeds.   Gastrointestinal:  Negative for blood in stool.  Genitourinary:  Negative for vaginal bleeding.      Physical Exam:  weight is 253 lb 1.9 oz (114.8 kg). Her oral temperature is 98.4 F (36.9 C). Her blood pressure is 136/78 and her pulse is 69. Her respiration is 18 and oxygen saturation is 100%.   Physical Exam Vitals and nursing note reviewed.  Constitutional:      General: She is not in acute distress.    Appearance: Normal appearance. She is not ill-appearing or toxic-appearing.  Cardiovascular:     Rate and Rhythm: Normal rate and regular rhythm.     Heart sounds: Normal heart sounds.  Pulmonary:     Effort: Pulmonary  effort is normal.     Breath sounds: Normal breath sounds.  Musculoskeletal:     Cervical back: Normal range of motion and neck supple.  Lymphadenopathy:     Cervical: No cervical adenopathy.  Skin:    General: Skin is warm.     Coloration: Skin is not pale.     Findings: No rash.  Neurological:     General: No focal deficit present.     Mental Status: She is alert and oriented to person, place, and time.    Lab Results  Component Value Date   WBC 6.1 04/02/2023   HGB 10.1 (L) 04/02/2023   HCT 29.8 (L) 04/02/2023   MCV 79.3 (L) 04/02/2023   PLT 226 04/02/2023     Chemistry      Component Value Date/Time   NA 139 01/30/2023 0623   NA 145 (H) 10/23/2017 1554   K 3.7 01/30/2023 0623    CL 107 01/30/2023 0623   CO2 24 01/30/2023 0623   BUN 15 01/30/2023 0623   BUN 15 10/23/2017 1554   CREATININE 0.92 01/30/2023 0623   CREATININE 0.88 01/28/2023 1226   CREATININE 1.04 05/18/2016 0928      Component Value Date/Time   CALCIUM 8.8 (L) 01/30/2023 0623   ALKPHOS 117 01/28/2023 1226   AST 18 01/28/2023 1226   ALT 11 01/28/2023 1226   BILITOT 0.5 01/28/2023 1226      Impression and Plan: Encounter Diagnoses  Name Primary?   Thalassemia minor Yes   Iron deficiency anemia, unspecified iron deficiency anemia type     Candice [name] is a 62 y.o. female with a history of Thalassemia minor and iron deficiency.   She is doing well. Her labs are stable. Hgb is 10.1 today. Ferritin is stable. Iron saturation is low normal. No need for change of plan at this time.   Disposition RTC 6 months APP, labs (CBC w/, CMP, retic, iron, ferritin, Hgb fractionation cascade)-[place]     I spent 25 minutes dedicated to the care of this patient (face to face and non-face to face) on the date of this encounter to include the items discussed above.   Clent Jacks PA-C 5/24/20241:16 PM

## 2023-04-03 LAB — IRON AND IRON BINDING CAPACITY (CC-WL,HP ONLY)
Iron: 34 ug/dL (ref 28–170)
Saturation Ratios: 15 % (ref 10.4–31.8)
TIBC: 234 ug/dL — ABNORMAL LOW (ref 250–450)
UIBC: 200 ug/dL (ref 148–442)

## 2023-04-05 LAB — HGB FRACTIONATION BY HPLC
Hgb A2: 3.8 % — ABNORMAL HIGH (ref 1.8–3.2)
Hgb A: 58 % — ABNORMAL LOW (ref 96.4–98.8)
Hgb C: 38.2 % — ABNORMAL HIGH
Hgb E: 0 %
Hgb F: 0 % (ref 0.0–2.0)
Hgb S: 0 %
Hgb Variant: 0 %

## 2023-04-05 LAB — HGB FRACTIONATION CASCADE

## 2023-04-11 ENCOUNTER — Other Ambulatory Visit: Payer: Self-pay | Admitting: Hematology & Oncology

## 2023-04-28 ENCOUNTER — Other Ambulatory Visit: Payer: Self-pay | Admitting: Family Medicine

## 2023-08-02 ENCOUNTER — Telehealth: Payer: BC Managed Care – PPO | Admitting: Family Medicine

## 2023-08-02 DIAGNOSIS — M109 Gout, unspecified: Secondary | ICD-10-CM | POA: Diagnosis not present

## 2023-08-02 MED ORDER — PREDNISONE 10 MG PO TABS: 40.0000 mg | ORAL_TABLET | Freq: Every day | ORAL | 0 refills | Status: AC

## 2023-08-02 NOTE — Progress Notes (Signed)
E-Visit for Gout Symptoms  We are sorry that you are not feeling well. We are here to help!  Based on what you shared with me it looks like you have a flare of your gout.  Gout is a form of arthritis. It can cause pain and swelling in the joints. At first, it tends to affect only 1 joint - most frequently the big toe. It happens in people who have too much uric acid in the blood. Uric acid is a chemical that is produced when the body breaks down certain foods. Uric acid can form sharp needle-like crystals that build up in the joints and cause pain. Uric acid crystals can also form inside the tubes that carry urine from the kidneys to the bladder. These crystals can turn into "kidney stones" that can cause pain and problems with the flow of urine. People with gout get sudden "flares" or attacks of severe pain, most often the big toe, ankle, or knee. Often the joint also turns red and swells. Usually, only 1 joint is affected, but some people have pain in more than 1 joint. Gout flares tend to happen more often during the night.  The pain from gout can be extreme. The pain and swelling are worst at the beginning of a gout flare. The symptoms then get better within a few days to weeks. It is not clear how the body "turns off" a gout flare.  Do not start any NEW preventative medicine until the gout has cleared completely. However, If you are already on Probenecid or Allopurinol for CHRONIC gout, you may continue taking this during an active flare up  I have prescribed Prednisone 40 mg daily for 7 days.   Please follow up with your regular primary care doctor about your gout, especially since you have had 2 flares close together   HOME CARE Losing weight can help relieve gout. It's not clear that following a specific diet plan will help with gout symptoms but eating a balanced diet can help improve your overall health. It can also help you lose weight, if you are overweight. In general, a healthy diet  includes plenty of fruits, vegetables, whole grains, and low-fat dairy products (labelled "low fat", skim, 2%). Avoid sugar sweetened drinks (including sodas, tea, juice and juice blends, coffee drinks and sports drinks) Limit alcohol to 1-2 drinks of beer, spirits or wine daily these can make gout flares worse. Some people with gout also have other health problems, such as heart disease, high blood pressure, kidney disease, or obesity. If you have any of these issues, it's important to work with your doctor to manage them. This can help improve your overall health and might also help with your gout.  GET HELP RIGHT AWAY IF: Your symptoms persist after you have completed your treatment plan You develop severe diarrhea You develop abnormal sensations  You develop vomiting,   You develop weakness  You develop abdominal pain  FOLLOW UP WITH YOUR PRIMARY PROVIDER IF: If your symptoms do not improve within 10 days  MAKE SURE YOU  Understand these instructions. Will watch your condition. Will get help right away if you are not doing well or get worse.  Thank you for choosing an e-visit.  Your e-visit answers were reviewed by a board certified advanced clinical practitioner to complete your personal care plan. Depending upon the condition, your plan could have included both over the counter or prescription medications.  Please review your pharmacy choice. Make sure the pharmacy  is open so you can pick up prescription now. If there is a problem, you may contact your provider through [employer] and have the prescription routed to another pharmacy.  Your safety is important to [place]. If you have drug allergies check your prescription carefully.   For the next 24 hours you can use MyChart to ask questions about today's visit, request a non-urgent call back, or ask for a work or school excuse. You will get an email in the next two days asking about your experience. I hope that your e-visit has been  valuable and will speed your recovery.  I have spent 5 minutes in review of e-visit questionnaire, review and updating patient chart, medical decision making and response to patient.   Rica Mast, PhD, FNP-BC

## 2023-08-14 ENCOUNTER — Ambulatory Visit: Payer: BC Managed Care – PPO | Admitting: Family Medicine

## 2023-08-14 ENCOUNTER — Encounter: Payer: Self-pay | Admitting: Family Medicine

## 2023-08-14 DIAGNOSIS — R809 Proteinuria, unspecified: Secondary | ICD-10-CM

## 2023-08-14 MED ORDER — ZEPBOUND 5 MG/0.5ML ~~LOC~~ SOAJ
5.0000 mg | SUBCUTANEOUS | 0 refills | Status: DC
Start: 2023-09-11 — End: 2023-10-07

## 2023-08-14 MED ORDER — ZEPBOUND 2.5 MG/0.5ML ~~LOC~~ SOAJ
2.5000 mg | SUBCUTANEOUS | 0 refills | Status: AC
Start: 2023-08-14 — End: 2023-09-11

## 2023-08-14 MED ORDER — ZEPBOUND 7.5 MG/0.5ML ~~LOC~~ SOAJ
7.5000 mg | SUBCUTANEOUS | 0 refills | Status: DC
Start: 2023-10-09 — End: 2023-10-07

## 2023-08-14 NOTE — Patient Instructions (Signed)
Let me know if there are cost or supply issues.   If you do not hear anything about your referral in the next 1-2 weeks, call our office and ask for an update.  Let us know if you need anything.

## 2023-08-14 NOTE — Progress Notes (Signed)
Chief Complaint  Patient presents with   Weight Loss    Subjective: Patient is a 62 y.o. female here for f/u.  Patient has a history of morbid obesity.  She has tried weight watchers several times.  She has never tried any weight loss medication but is very interested in doing so.  Her diet could be better and she struggles with cravings.  Portions are not terribly burdensome to her.  She has never seen a nutritionist, weight loss specialist, or bariatric surgeon.  She has chronic gout and Sjogren's syndrome which limit her dietary options.  Patient had a kidney biopsy done in March.  She was told nothing further needs to be done.  She had this performed due to proteinuria.  She reports the nephrologist told her to follow-up with her PCP for further questions.  Past Medical History:  Diagnosis Date   Allergy    Gout    great right toe   Heart murmur    Hypertension     Objective: BP 120/80 (BP Location: Left Arm, Patient Position: Sitting, Cuff Size: Normal)   Pulse 63   Temp 99.3 F (37.4 C) (Oral)   Ht 5\' 4"  (1.626 m)   Wt 250 lb 6 oz (113.6 kg)   SpO2 93%   BMI 42.98 kg/m  General: Awake, appears stated age Lungs: No accessory muscle use Psych: Age appropriate judgment and insight, normal affect and mood  Assessment and Plan: Morbid obesity (HCC) - Plan: Amb Ref to Medical Weight Management, tirzepatide (ZEPBOUND) 2.5 MG/0.5ML Pen, tirzepatide (ZEPBOUND) 5 MG/0.5ML Pen, tirzepatide (ZEPBOUND) 7.5 MG/0.5ML Pen  Proteinuria, unspecified type  Chronic, uncontrolled.  Refer to medical weight loss team.  Counseled on diet and exercise.  Start is about 2.5 mg weekly and steadily increase.  Follow-up in 6 weeks to recheck.  She will let me know if there are any cost or supply issues. We went over her biopsy results.  I reviewed her renal function.  No further treatment required.  All her questions were answered. The patient voiced understanding and agreement to the plan.  I  spent 43 minutes with the patient discussing the above plans in addition to reviewing her chart on the same day of the visit.  Candice Roche [place], DO 08/14/23  8:15 AM

## 2023-08-15 ENCOUNTER — Encounter: Payer: Self-pay | Admitting: Family Medicine

## 2023-10-04 ENCOUNTER — Other Ambulatory Visit: Payer: Self-pay | Admitting: *Deleted

## 2023-10-04 DIAGNOSIS — D563 Thalassemia minor: Secondary | ICD-10-CM

## 2023-10-04 DIAGNOSIS — D509 Iron deficiency anemia, unspecified: Secondary | ICD-10-CM

## 2023-10-07 ENCOUNTER — Ambulatory Visit: Payer: BC Managed Care – PPO | Admitting: Family Medicine

## 2023-10-07 ENCOUNTER — Other Ambulatory Visit: Payer: Self-pay | Admitting: Family Medicine

## 2023-10-07 ENCOUNTER — Encounter: Payer: Self-pay | Admitting: Family Medicine

## 2023-10-07 ENCOUNTER — Inpatient Hospital Stay: Payer: BC Managed Care – PPO | Attending: Hematology & Oncology

## 2023-10-07 ENCOUNTER — Encounter: Payer: Self-pay | Admitting: Medical Oncology

## 2023-10-07 ENCOUNTER — Inpatient Hospital Stay: Payer: BC Managed Care – PPO | Admitting: Medical Oncology

## 2023-10-07 VITALS — BP 182/92 | HR 62 | Temp 98.2°F | Resp 19 | Ht 64.0 in | Wt 257.0 lb

## 2023-10-07 DIAGNOSIS — D563 Thalassemia minor: Secondary | ICD-10-CM | POA: Insufficient documentation

## 2023-10-07 DIAGNOSIS — D509 Iron deficiency anemia, unspecified: Secondary | ICD-10-CM

## 2023-10-07 DIAGNOSIS — I10 Essential (primary) hypertension: Secondary | ICD-10-CM | POA: Diagnosis not present

## 2023-10-07 DIAGNOSIS — M109 Gout, unspecified: Secondary | ICD-10-CM | POA: Diagnosis not present

## 2023-10-07 DIAGNOSIS — Z6841 Body Mass Index (BMI) 40.0 and over, adult: Secondary | ICD-10-CM | POA: Diagnosis not present

## 2023-10-07 DIAGNOSIS — Z0289 Encounter for other administrative examinations: Secondary | ICD-10-CM

## 2023-10-07 LAB — CBC WITH DIFFERENTIAL (CANCER CENTER ONLY)
Abs Immature Granulocytes: 0.01 10*3/uL (ref 0.00–0.07)
Basophils Absolute: 0 10*3/uL (ref 0.0–0.1)
Basophils Relative: 1 %
Eosinophils Absolute: 0.1 10*3/uL (ref 0.0–0.5)
Eosinophils Relative: 3 %
HCT: 32.5 % — ABNORMAL LOW (ref 36.0–46.0)
Hemoglobin: 11.2 g/dL — ABNORMAL LOW (ref 12.0–15.0)
Immature Granulocytes: 0 %
Lymphocytes Relative: 38 %
Lymphs Abs: 1.5 10*3/uL (ref 0.7–4.0)
MCH: 28.4 pg (ref 26.0–34.0)
MCHC: 34.5 g/dL (ref 30.0–36.0)
MCV: 82.3 fL (ref 80.0–100.0)
Monocytes Absolute: 0.4 10*3/uL (ref 0.1–1.0)
Monocytes Relative: 9 %
Neutro Abs: 1.9 10*3/uL (ref 1.7–7.7)
Neutrophils Relative %: 49 %
Platelet Count: 207 10*3/uL (ref 150–400)
RBC: 3.95 MIL/uL (ref 3.87–5.11)
RDW: 13.7 % (ref 11.5–15.5)
WBC Count: 3.9 10*3/uL — ABNORMAL LOW (ref 4.0–10.5)
nRBC: 0 % (ref 0.0–0.2)

## 2023-10-07 LAB — CMP (CANCER CENTER ONLY)
ALT: 11 U/L (ref 0–44)
AST: 20 U/L (ref 15–41)
Albumin: 3.5 g/dL (ref 3.5–5.0)
Alkaline Phosphatase: 100 U/L (ref 38–126)
Anion gap: 8 (ref 5–15)
BUN: 14 mg/dL (ref 8–23)
CO2: 27 mmol/L (ref 22–32)
Calcium: 9.4 mg/dL (ref 8.9–10.3)
Chloride: 106 mmol/L (ref 98–111)
Creatinine: 1.04 mg/dL — ABNORMAL HIGH (ref 0.44–1.00)
GFR, Estimated: >60 mL/min (ref >60)
Glucose, Bld: 108 mg/dL — ABNORMAL HIGH (ref 70–99)
Potassium: 3.6 mmol/L (ref 3.5–5.1)
Sodium: 141 mmol/L (ref 135–145)
Total Bilirubin: 0.5 mg/dL (ref <1.2)
Total Protein: 7.5 g/dL (ref 6.5–8.1)

## 2023-10-07 LAB — IRON AND IRON BINDING CAPACITY (CC-WL,HP ONLY)
Iron: 69 ug/dL (ref 28–170)
Saturation Ratios: 27 % (ref 10.4–31.8)
TIBC: 255 ug/dL (ref 250–450)
UIBC: 186 ug/dL (ref 148–442)

## 2023-10-07 LAB — RETICULOCYTES
Immature Retic Fract: 16.5 % — ABNORMAL HIGH (ref 2.3–15.9)
RBC.: 3.88 MIL/uL (ref 3.87–5.11)
Retic Count, Absolute: 53.9 10*3/uL (ref 19.0–186.0)
Retic Ct Pct: 1.4 % (ref 0.4–3.1)

## 2023-10-07 LAB — FERRITIN: Ferritin: 69 ng/mL (ref 11–307)

## 2023-10-07 MED ORDER — SEMAGLUTIDE-WEIGHT MANAGEMENT 0.5 MG/0.5ML ~~LOC~~ SOAJ: 0.5000 mg | SUBCUTANEOUS | 0 refills | Status: DC

## 2023-10-07 MED ORDER — SEMAGLUTIDE-WEIGHT MANAGEMENT 1 MG/0.5ML ~~LOC~~ SOAJ: 1.0000 mg | SUBCUTANEOUS | 0 refills | Status: DC

## 2023-10-07 MED ORDER — SEMAGLUTIDE-WEIGHT MANAGEMENT 0.25 MG/0.5ML ~~LOC~~ SOAJ: 0.2500 mg | SUBCUTANEOUS | 0 refills | Status: DC

## 2023-10-07 MED ORDER — SEMAGLUTIDE-WEIGHT MANAGEMENT 2.4 MG/0.75ML ~~LOC~~ SOAJ: 2.4000 mg | SUBCUTANEOUS | 0 refills | Status: DC

## 2023-10-07 MED ORDER — SEMAGLUTIDE-WEIGHT MANAGEMENT 1.7 MG/0.75ML ~~LOC~~ SOAJ: 1.7000 mg | SUBCUTANEOUS | 0 refills | Status: DC

## 2023-10-07 NOTE — Patient Instructions (Addendum)
Keep the diet clean and stay active.  Let me know if there are cost issues with the [facility].   Aim to do some physical exertion for 150 minutes per week. This is typically divided into 5 days per week, 30 minutes per day. The activity should be enough to get your heart rate up. Anything is better than nothing if you have time constraints.  Let us know if you need anything.

## 2023-10-07 NOTE — Progress Notes (Signed)
Chief Complaint  Patient presents with   Follow-up    Follow up    Subjective: Patient is a 62 y.o. female here for f/u weight loss.  Zepbound rx'd but not covered by ins. She has never been on any other meds. Diet is OK, she has a craving for sweets. No routine exercise. She has been thru Weight Watchers a few times.  Past Medical History:  Diagnosis Date   Allergy    Gout    great right toe   Heart murmur    Hypertension     Objective: BP 132/84 (BP Location: Left Arm, Patient Position: Sitting, Cuff Size: Normal)   Pulse 60   Temp 98 F (36.7 C) (Oral)   Resp 16   Ht 5\' 4"  (1.626 m)   Wt 257 lb (116.6 kg)   SpO2 98%   BMI 44.11 kg/m  General: Awake, appears stated age Heart: RRR, no LE edema Lungs: CTAB, no rales, wheezes or rhonchi. No accessory muscle use Psych: Age appropriate judgment and insight, normal affect and mood  Assessment and Plan: Morbid obesity (HCC) - Plan: Semaglutide-Weight Management 0.25 MG/0.5ML SOAJ, Semaglutide-Weight Management 0.5 MG/0.5ML SOAJ, Semaglutide-Weight Management 1 MG/0.5ML SOAJ, Semaglutide-Weight Management 1.7 MG/0.75ML SOAJ, Semaglutide-Weight Management 2.4 MG/0.75ML SOAJ  Chronic, unstable. Will try Wegovy.  Contact information for the weight loss clinic provided.  If Reginal Lutes is not covered, we will set her up with a short course of phentermine, no more than 90 days, and follow-up accordingly.  Counseled on diet and exercise. The patient voiced understanding and agreement to the plan.  Jilda Roche [place], DO 10/07/23  11:50 AM

## 2023-10-07 NOTE — Progress Notes (Signed)
Hematology and Oncology Follow Up Visit  Candice [name] [id number] 13-May-1961 62 y.o. 10/07/2023  Past Medical History:  Diagnosis Date   Allergy    Gout    great right toe   Heart murmur    Hypertension     Principle Diagnosis:  Thalassemia Minor  Current Therapy:   Observation Folic Acid encouraged   PriorTherapy:   None   Interim History:  Candice [name] is back for follow-up for her Thalassemia Minor:   She reports that she is not taking folic acid but is overall doing well. She is dealing with a gout flare. She is taking ibuprofen.   She continues to be seen by her rheumatologist. Previous renal biopsy was non-concerning per patient.   Overall she reports that she is doing well. No major bleeding or bruising episodes. Chronic fatigue is stable.   Appetite is up a bit  Wt Readings from Last 3 Encounters:  10/07/23 257 lb (116.6 kg)  08/14/23 250 lb 6 oz (113.6 kg)  04/02/23 253 lb 1.9 oz (114.8 kg)     Medications:   Current Outpatient Medications:    atenolol (TENORMIN) 50 MG tablet, TAKE 1 TABLET BY MOUTH EVERY DAY, Disp: 90 tablet, Rfl: 4   colchicine 0.6 MG tablet, TAKE 1 TABLET BY MOUTH EVERY DAY, Disp: 90 tablet, Rfl: 3   Emollient (EUCERIN) lotion, Apply topically onto the skin as needed for dry skin., Disp: , Rfl:    fluticasone (FLONASE) 50 MCG/ACT nasal spray, Place 2 sprays into both nostrils daily., Disp: 16 g, Rfl: 2   folic acid (FOLVITE) 1 MG tablet, TAKE 2 TABLETS BY MOUTH EVERY DAY, Disp: 60 tablet, Rfl: 2   hydroxychloroquine (PLAQUENIL) 200 MG tablet, Take 1 tablet (200 mg total) by mouth 2 (two) times daily., Disp: 60 tablet, Rfl: 5   levocetirizine (XYZAL) 5 MG tablet, TAKE 1 TABLET BY MOUTH EVERY DAY IN THE EVENING, Disp: 90 tablet, Rfl: 2   Multiple Vitamins-Minerals (PRESERVISION AREDS 2) CAPS, Take 2 capsules by mouth daily., Disp: , Rfl:    probenecid (BENEMID) 500 MG tablet, Take 500 mg by mouth 2 (two) times daily., Disp: , Rfl:     triamcinolone ointment (KENALOG) 0.1 %, Apply 1 application topically as needed., Disp: 30 g, Rfl: 1   valsartan (DIOVAN) 160 MG tablet, TAKE 1 TABLET BY MOUTH EVERY DAY, Disp: 90 tablet, Rfl: 1   tirzepatide (ZEPBOUND) 5 MG/0.5ML Pen, Inject 5 mg into the skin once a week for 28 days. (Patient not taking: Reported on 10/07/2023), Disp: 2 mL, Rfl: 0   [START ON 10/09/2023] tirzepatide (ZEPBOUND) 7.5 MG/0.5ML Pen, Inject 7.5 mg into the skin once a week for 28 days. (Patient not taking: Reported on 10/07/2023), Disp: 2 mL, Rfl: 0  Allergies:  Allergies  Allergen Reactions   Ace Inhibitors Nausea And Vomiting   Allopurinol Nausea Only    Chills, headache, sore throat, sweats.     Past Medical History, Surgical history, Social history, and Family History were reviewed and updated.  Review of Systems: Review of Systems  Constitutional:  Positive for fatigue. Negative for unexpected weight change.  HENT:   Negative for nosebleeds.   Gastrointestinal:  Negative for blood in stool.  Genitourinary:  Negative for vaginal bleeding.   Musculoskeletal:  Positive for arthralgias.    Physical Exam:  height is 5\' 4"  (1.626 m) and weight is 257 lb (116.6 kg). Her oral temperature is 98.2 F (36.8 C). Her blood pressure is 182/92 (abnormal)  and her pulse is 62. Her respiration is 19 and oxygen saturation is 100%.   Physical Exam Vitals and nursing note reviewed.  Constitutional:      General: She is not in acute distress.    Appearance: Normal appearance. She is not ill-appearing or toxic-appearing.  Cardiovascular:     Rate and Rhythm: Normal rate and regular rhythm.     Heart sounds: Normal heart sounds.  Pulmonary:     Effort: Pulmonary effort is normal.     Breath sounds: Normal breath sounds.  Musculoskeletal:     Cervical back: Normal range of motion and neck supple.  Lymphadenopathy:     Cervical: No cervical adenopathy.  Skin:    General: Skin is warm.     Coloration: Skin is not  pale.     Findings: No rash.  Neurological:     General: No focal deficit present.     Mental Status: She is alert and oriented to person, place, and time.    Lab Results  Component Value Date   WBC 3.9 (L) 10/07/2023   HGB 11.2 (L) 10/07/2023   HCT 32.5 (L) 10/07/2023   MCV 82.3 10/07/2023   PLT 207 10/07/2023     Chemistry      Component Value Date/Time   NA 139 01/30/2023 0623   NA 145 (H) 10/23/2017 1554   K 3.7 01/30/2023 0623   CL 107 01/30/2023 0623   CO2 24 01/30/2023 0623   BUN 15 01/30/2023 0623   BUN 15 10/23/2017 1554   CREATININE 0.92 01/30/2023 0623   CREATININE 0.88 01/28/2023 1226   CREATININE 1.04 05/18/2016 0928      Component Value Date/Time   CALCIUM 8.8 (L) 01/30/2023 0623   ALKPHOS 117 01/28/2023 1226   AST 18 01/28/2023 1226   ALT 11 01/28/2023 1226   BILITOT 0.5 01/28/2023 1226      Impression and Plan: Encounter Diagnoses  Name Primary?   Thalassemia minor Yes   Iron deficiency anemia, unspecified iron deficiency anemia type     Candice [name] is a 62 y.o. female with a history of Thalassemia minor and iron deficiency.   She is doing well other than her gout and elevated BP. She is asymptomatic for her elevated BP. I asked to recheck her BP in office however she reports that she has an appointment with her PCP's office downstairs in just a few minutes and they are discussing her BP. She would like to defer recheck here since she will be having this performed downstairs.  Her labs are stable overall. WBC is down scantly- will follow Hgb is up to 11.2 today.  Iron studies pending No need for change of plan at this time.  Encouraged her to start her folic acid.   Disposition RTC 6 months APP, labs (CBC w/, CMP, retic, iron, ferritin, Hgb fractionation cascade)-[place]     I spent 25 minutes dedicated to the care of this patient (face to face and non-face to face) on the date of this encounter to include the items discussed above.   Clent Jacks PA-C 11/25/202410:37 AM

## 2023-10-08 ENCOUNTER — Encounter: Payer: Self-pay | Admitting: Family Medicine

## 2023-10-08 ENCOUNTER — Ambulatory Visit: Payer: BC Managed Care – PPO | Admitting: Family Medicine

## 2023-10-08 VITALS — BP 154/92 | HR 60 | Temp 98.0°F | Ht 64.0 in | Wt 253.0 lb

## 2023-10-08 DIAGNOSIS — I872 Venous insufficiency (chronic) (peripheral): Secondary | ICD-10-CM

## 2023-10-08 DIAGNOSIS — I10 Essential (primary) hypertension: Secondary | ICD-10-CM

## 2023-10-08 DIAGNOSIS — E65 Localized adiposity: Secondary | ICD-10-CM

## 2023-10-08 DIAGNOSIS — Z6841 Body Mass Index (BMI) 40.0 and over, adult: Secondary | ICD-10-CM

## 2023-10-08 DIAGNOSIS — E66813 Obesity, class 3: Secondary | ICD-10-CM

## 2023-10-08 HISTORY — DX: Localized adiposity: E65

## 2023-10-08 NOTE — Assessment & Plan Note (Signed)
BP is elevated today She reports consistent intake of valsartan 160 mg daily and atenolol 50 mg daily She denies Headaches or chest pain  Continue all BP medications as directed Begin active plan for weight reduction, looking for improvements

## 2023-10-08 NOTE — Assessment & Plan Note (Signed)
She has had LE edema due to chronic venous insufficiency worsened by weight gain  Begin active plan for weight reduction, looking for improvements over time

## 2023-10-08 NOTE — Assessment & Plan Note (Signed)
Reviewed bioimpedence results showing a high visceral fat rating of 19 with a goal <10 She has a growing list of obesity related co-morbidities  Obtain labs next visit along with fasting IC

## 2023-10-08 NOTE — Progress Notes (Signed)
Office: [phone]  /  Fax: [phone]   Initial Visit  Candice [name] was seen in clinic today to evaluate for obesity. She is interested in losing weight to improve overall health and reduce the risk of weight related complications. She presents today to review program treatment options, initial physical assessment, and evaluation.     She was referred by: PCP  When asked what else they would like to accomplish? She states: Improve existing medical conditions, Reduce number of medications, Improve quality of life, and Improve appearance  would like to get to 190 lb.  She got down to 190 lb in 2016 with Weight Watcher  Weight history: overweight in childhood and has gained weight over time.    When asked how has your weight affected you? She states: Contributed to medical problems, Contributed to orthopedic problems or mobility issues, and Having fatigue  Some associated conditions: gout, venous stasis, HTN  Contributing factors: Family history of obesity, Moderate to high levels of stress, Reduced physical activity, and Eating patterns retirement.  Granddaughter stays with her [employer]  Weight promoting medications identified: Steroids  Current nutrition plan: None  Current level of physical activity: Walking  Current or previous pharmacotherapy: None  Response to medication: Never tried medications   Past medical history includes:   Past Medical History:  Diagnosis Date   Allergy    Gout    great right toe   Heart murmur    Hypertension      Objective:   BP (!) 154/92   Pulse 60   Temp 98 F (36.7 C)   Ht 5\' 4"  (1.626 m)   Wt 253 lb (114.8 kg)   SpO2 100%   BMI 43.43 kg/m  She was weighed on the bioimpedance scale: Body mass index is 43.43 kg/m.  Peak Weight:256 , Body Fat%:53.3, Visceral Fat Rating:19, Weight trend over the last 12 months: Increasing  General:  Alert, oriented and cooperative. Patient is in no acute distress.  Respiratory: Normal  respiratory effort, no problems with respiration noted   Gait: able to ambulate independently  Mental Status: Normal mood and affect. Normal behavior. Normal judgment and thought content.   DIAGNOSTIC DATA REVIEWED:  BMET    Component Value Date/Time   NA 141 10/07/2023 1004   NA 145 (H) 10/23/2017 1554   K 3.6 10/07/2023 1004   CL 106 10/07/2023 1004   CO2 27 10/07/2023 1004   GLUCOSE 108 (H) 10/07/2023 1004   BUN 14 10/07/2023 1004   BUN 15 10/23/2017 1554   CREATININE 1.04 (H) 10/07/2023 1004   CREATININE 1.04 05/18/2016 0928   CALCIUM 9.4 10/07/2023 1004   GFRNONAA >60 10/07/2023 1004   GFRNONAA 50 (L) 02/24/2016 1010   GFRAA 77 10/23/2017 1554   GFRAA 58 (L) 02/24/2016 1010   No results found for: "HGBA1C" No results found for: "INSULIN" CBC    Component Value Date/Time   WBC 3.9 (L) 10/07/2023 1004   WBC 4.7 01/30/2023 0623   RBC 3.88 10/07/2023 1005   RBC 3.95 10/07/2023 1004   HGB 11.2 (L) 10/07/2023 1004   HGB 11.6 04/25/2017 1635   HCT 32.5 (L) 10/07/2023 1004   HCT 35.0 04/25/2017 1635   PLT 207 10/07/2023 1004   PLT 237 04/25/2017 1635   MCV 82.3 10/07/2023 1004   MCV 81 04/25/2017 1635   MCH 28.4 10/07/2023 1004   MCHC 34.5 10/07/2023 1004   RDW 13.7 10/07/2023 1004   RDW 16.4 (H) 04/25/2017 1635  Iron/TIBC/Ferritin/ %Sat    Component Value Date/Time   IRON 69 10/07/2023 1004   TIBC 255 10/07/2023 1004   FERRITIN 69 10/07/2023 1005   IRONPCTSAT 27 10/07/2023 1004   IRONPCTSAT 25 11/13/2022 0826   Lipid Panel     Component Value Date/Time   CHOL 124 10/29/2022 1339   CHOL 134 08/12/2017 0949   TRIG 65.0 10/29/2022 1339   HDL 49.70 10/29/2022 1339   HDL 65 08/12/2017 0949   CHOLHDL 3 10/29/2022 1339   VLDL 13.0 10/29/2022 1339   LDLCALC 62 10/29/2022 1339   LDLCALC 59 08/12/2017 0949   Hepatic Function Panel     Component Value Date/Time   PROT 7.5 10/07/2023 1004   PROT 8.0 08/12/2017 0949   ALBUMIN 3.5 10/07/2023 1004   ALBUMIN  4.1 08/12/2017 0949   AST 20 10/07/2023 1004   ALT 11 10/07/2023 1004   ALKPHOS 100 10/07/2023 1004   BILITOT 0.5 10/07/2023 1004   BILIDIR 0.1 11/13/2022 0826   BILIDIR 0.17 08/12/2017 0949   No results found for: "TSH"   Assessment and Plan:   Essential hypertension Assessment & Plan: BP is elevated today She reports consistent intake of valsartan 160 mg daily and atenolol 50 mg daily She denies Headaches or chest pain  Continue all BP medications as directed Begin active plan for weight reduction, looking for improvements   Class 3 severe obesity due to excess calories with serious comorbidity and body mass index (BMI) of 40.0 to 44.9 in adult [facility])  Chronic venous insufficiency Assessment & Plan: She has had LE edema due to chronic venous insufficiency worsened by weight gain  Begin active plan for weight reduction, looking for improvements over time    Central adiposity Assessment & Plan: Reviewed bioimpedence results showing a high visceral fat rating of 19 with a goal <10 She has a growing list of obesity related co-morbidities  Obtain labs next visit along with fasting IC          Obesity Treatment / Action Plan:  Patient will work on garnering support from family and friends to begin weight loss journey. Will work on eliminating or reducing the presence of highly palatable, calorie dense foods in the home. Will complete provided nutritional and psychosocial assessment questionnaire before the next appointment. Will be scheduled for indirect calorimetry to determine resting energy expenditure in a fasting state.  This will allow [place] to create a reduced calorie, high-protein meal plan to promote loss of fat mass while preserving muscle mass. Will think about ideas on how to incorporate physical activity into their daily routine. Counseled on the health benefits of losing 5%-15% of total body weight. Was counseled on nutritional approaches to weight loss and  benefits of reducing processed foods and consuming plant-based foods and high quality protein as part of nutritional weight management. Was counseled on pharmacotherapy and role as an adjunct in weight management.   Obesity Education Performed Today:  She was weighed on the bioimpedance scale and results were discussed and documented in the synopsis.  We discussed obesity as a disease and the importance of a more detailed evaluation of all the factors contributing to the disease.  We discussed the importance of long term lifestyle changes which include nutrition, exercise and behavioral modifications as well as the importance of customizing this to her specific health and social needs.  We discussed the benefits of reaching a healthier weight to alleviate the symptoms of existing conditions and reduce the risks of the biomechanical,  metabolic and psychological effects of obesity.  Cheron Schaumann appears to be in the action stage of change and states they are ready to start intensive lifestyle modifications and behavioral modifications.  24 minutes was spent today on this visit including the above counseling, pre-visit chart review, and post-visit documentation.  Reviewed by clinician on day of visit: allergies, medications, problem list, medical history, surgical history, family history, social history, and previous encounter notes pertinent to obesity diagnosis.    Seymour Bars, D.O. DABFM, DABOM [facility] [phone] [place], [place] [address] [phone]

## 2023-10-09 LAB — HGB FRACTIONATION CASCADE

## 2023-10-09 LAB — HGB FRACTIONATION BY HPLC
Hgb A2: 3.6 % — ABNORMAL HIGH (ref 1.8–3.2)
Hgb A: 60.3 % — ABNORMAL LOW (ref 96.4–98.8)
Hgb C: 36.1 % — ABNORMAL HIGH
Hgb E: 0 %
Hgb F: 0 % (ref 0.0–2.0)
Hgb S: 0 %
Hgb Variant: 0 %

## 2023-10-15 ENCOUNTER — Other Ambulatory Visit: Payer: Self-pay | Admitting: Family Medicine

## 2023-10-15 DIAGNOSIS — J302 Other seasonal allergic rhinitis: Secondary | ICD-10-CM

## 2023-10-17 ENCOUNTER — Ambulatory Visit: Payer: BC Managed Care – PPO | Admitting: Family Medicine

## 2023-10-17 ENCOUNTER — Encounter: Payer: Self-pay | Admitting: Family Medicine

## 2023-10-17 VITALS — BP 145/86 | HR 55 | Temp 98.0°F | Ht 64.0 in | Wt 253.0 lb

## 2023-10-17 DIAGNOSIS — R5383 Other fatigue: Secondary | ICD-10-CM

## 2023-10-17 DIAGNOSIS — Z6841 Body Mass Index (BMI) 40.0 and over, adult: Secondary | ICD-10-CM

## 2023-10-17 DIAGNOSIS — E66813 Obesity, class 3: Secondary | ICD-10-CM

## 2023-10-17 DIAGNOSIS — I872 Venous insufficiency (chronic) (peripheral): Secondary | ICD-10-CM

## 2023-10-17 DIAGNOSIS — R9431 Abnormal electrocardiogram [ECG] [EKG]: Secondary | ICD-10-CM | POA: Diagnosis not present

## 2023-10-17 DIAGNOSIS — R0602 Shortness of breath: Secondary | ICD-10-CM | POA: Diagnosis not present

## 2023-10-17 DIAGNOSIS — Z1331 Encounter for screening for depression: Secondary | ICD-10-CM | POA: Diagnosis not present

## 2023-10-17 DIAGNOSIS — R351 Nocturia: Secondary | ICD-10-CM

## 2023-10-17 DIAGNOSIS — I10 Essential (primary) hypertension: Secondary | ICD-10-CM

## 2023-10-17 DIAGNOSIS — G4730 Sleep apnea, unspecified: Secondary | ICD-10-CM

## 2023-10-17 DIAGNOSIS — R948 Abnormal results of function studies of other organs and systems: Secondary | ICD-10-CM

## 2023-10-17 HISTORY — DX: Sleep apnea, unspecified: G47.30

## 2023-10-17 HISTORY — DX: Nocturia: R35.1

## 2023-10-17 HISTORY — DX: Obesity, class 3: E66.813

## 2023-10-17 HISTORY — DX: Abnormal results of function studies of other organs and systems: R94.8

## 2023-10-17 NOTE — Progress Notes (Signed)
At a Glance:  Vitals Temp: 98 F (36.7 C) BP: (!) 145/86 Pulse Rate: (!) 55 SpO2: 100 %   Anthropometric Measurements Height: 5\' 4"  (1.626 m) Weight: 253 lb (114.8 kg) BMI (Calculated): 43.41 Starting Weight: 253lb Peak Weight: 256lb   Body Composition  Body Fat %: 53 % Fat Mass (lbs): 134.2 lbs Muscle Mass (lbs): 113 lbs Total Body Water (lbs): 86.6 lbs Visceral Fat Rating : 18   Other Clinical Data RMR: 1310 Fasting: Yes Labs: Yes Today's Visit #: 1 Starting Date: 10/17/23    EKG: Normal sinus rhythm, rate 50.  Indirect Calorimeter completed today shows a VO2 of 190 and a REE of 1310.  Her calculated basal metabolic rate is 1610 thus her basal metabolic rate is worse than expected.  Chief Complaint:  Obesity   Subjective:  Candice [name] (MR# [id number]) is a 62 y.o. female who presents for evaluation and treatment of obesity and related comorbidities.   Candice [name] is currently in the action stage of change and ready to dedicate time achieving and maintaining a healthier weight. Candice [name] is interested in becoming our patient and working on intensive lifestyle modifications including (but not limited to) diet and exercise for weight loss.  Candice [name] has been struggling with her weight. She has been unsuccessful in either losing weight, maintaining weight loss, or reaching her healthy weight goal.  Candice [name]'s habits were reviewed today and are as follows: she has been heavy most of her life, she has significant food cravings issues, she snacks frequently in the evenings, she is frequently drinking liquids with calories, she frequently makes poor food choices, she has problems with excessive hunger, and she struggles with emotional eating.  Other Fatigue Candice [name] admits to daytime somnolence and admits to waking up still tired. Patient has a history of symptoms of daytime fatigue. Candice [name] generally gets 6 or 7 hours of sleep per night, and states that she has difficulty  falling asleep and nightime awakenings. Snoring is present. Apneic episodes are present. Epworth Sleepiness Score is 9.   Shortness of Breath Candice [name] notes increasing shortness of breath with exercising and seems to be worsening over time with weight gain. She notes getting out of breath sooner with activity than she used to. This has gotten worse recently. Candice [name] denies shortness of breath at rest or orthopnea.   Depression Screen Candice [name] Food and Mood (modified PHQ-9) score was 11.     10/07/2023   11:10 AM  Depression screen PHQ 2/9  Decreased Interest 0  Down, Depressed, Hopeless 0  PHQ - 2 Score 0  Altered sleeping 0  Tired, decreased energy 0  Change in appetite 0  Feeling bad or failure about yourself  0  Trouble concentrating 0  Moving slowly or fidgety/restless 0  Suicidal thoughts 0  PHQ-9 Score 0  Difficult doing work/chores Not difficult at all     Assessment and Plan:   Other Fatigue Candice [name] does feel that her weight is causing her energy to be lower than it should be. Fatigue may be related to obesity, depression or many other causes. Labs will be ordered, and in the meanwhile, Aavya will focus on self care including making healthy food choices, increasing physical activity and focusing on stress reduction.  Shortness of Breath Candice [name] does feel that she gets out of breath more easily that she used to when she exercises. Candice [name]'s shortness of breath appears to be obesity related and exercise induced. She has agreed to work on weight loss and gradually  increase exercise to treat her exercise induced shortness of breath. Will continue to monitor closely.  Candice [name] had a positive depression screening. Depression is commonly associated with obesity and often results in emotional eating behaviors. We will monitor this closely and work on CBT to help improve the non-hunger eating patterns. Referral to Psychology may be required if no improvement is seen as she continues in  our clinic.    Problem List Items Addressed This Visit     Essential hypertension    BP is elevated today She reports taking valsartan 160 mg daily and atenolol 50 mg daily but has been inconsistent with the timing of her doses due to her schedule  Continue all anti hypertensive meds as directed Look for BP improvements with weight loss Set a reminder alarm to remember to take meds      Relevant Orders   Ambulatory referral to Cardiology   Abnormal EKG    Reviewed EKG findings which are unchanged from her last EKG 08/12/2017 in her chart.  She has findings of LVH, LAD, sinus bradycardia and QRS widening.  She is asymptomatic without chest pain, heart palpitations or dyspnea on exertion.  She reports her last stress test was done in 2012 and her last echocardiogram was done over 5 years ago.  She has a listed diagnosis of cardiomegaly.  Since she will be starting our program of medically supervised weight management which will include increased physical activity, will refer to Dr. Jens Som for cardiac testing.  Begin active plan for weight reduction. Notably, patient does not qualify for use of a GLP-1 receptor agonist due to lack of insurance coverage.      Relevant Orders   Ambulatory referral to Cardiology   Chronic venous insufficiency    She has chronic bilateral lower extremity edema.  She has been unable to wear compression socks to work though she stands for about 6 hours at the time due to leg pain from sciatica.  She denies any improving leg edema first thing in the morning.  She does note problems with frequent urination at night.  Begin active plan for weight reduction.  Follow-up with vascular surgery for further treatment options.      Nocturia    She notes waking up 5-6 times each night to urinate.  She denies any change in her leg edema in the morning or at night but does have chronic venous stasis.  She also notes drinking all the way up until bedtime including things  like Pepsi and sweet tea.  We talked about reducing nighttime drinking, cutting off fluid intake around 7 PM while discouraging intake of tea and soda.  Encouraged increased water intake earlier in the morning.      Low basal metabolic rate    Reviewed testing results from her indirect calorimetry.  Her metabolic rate is 829 cal lower than the expected at 1310 kcal/day versus 1719 kcal/day as expected.  This is likely due to inactivity, untreated sleep apnea, a poor diet with lack of lean protein intake.  We discussed working on improving food choices with lean protein and fiber with meals, reducing excess starches and sweets, increasing level of physical activity over time to include both cardio and resistance training and evaluating for potentially OSA.  Will aim for good 7 to 8 hours of restful sleep at night.      Class 3 severe obesity due to excess calories with serious comorbidity and body mass index (BMI) of 40.0 to 44.9 in adult (  HCC)   Sleep-disordered breathing    Epworth sleepiness score: 9.  She notes snoring, daytime somnolence and frequent nighttime awakenings.  She has never had a polysomnogram.  She does appear to be high risk for obstructive sleep apnea with a large neck circumference and a low metabolic rate with a BMI of 43 and central adiposity.  Referral made to [facility] neurology for polysomnogram      Relevant Orders   Ambulatory referral to Neurology   Other Visit Diagnoses     SOBOE (shortness of breath on exertion)    -  Primary   Other fatigue       Relevant Orders   EKG 12-Lead   VITAMIN D 25 Hydroxy (Vit-D Deficiency, Fractures)   Lipid panel   Insulin, random   Hemoglobin A1c   TSH Rfx on Abnormal to Free T4   Vitamin B12   Folate       Candice [name] is currently in the action stage of change and her goal is to continue with weight loss efforts. I recommend Mercadez begin the structured treatment plan as follows:  She has agreed to Category 2  Plan  Exercise goals: No exercise has been prescribed at this time.  Behavioral modification strategies:increasing lean protein intake, increasing vegetables, increase H2O intake, decrease liquid calories, decrease ETOH, decreasing eating out, meal planning and cooking strategies, keeping healthy foods in the home, better snacking choices, avoiding temptations, planning for success, and decrease junk food   She was informed of the importance of frequent follow-up visits to maximize her success with intensive lifestyle modifications for her multiple health conditions. She was informed we would discuss her lab results at her next visit unless there is a critical issue that needs to be addressed sooner. Kash agreed to keep her next visit at the agreed upon time to discuss these results.  Objective:  General: Cooperative, alert, well developed, in no acute distress. HEENT: Conjunctivae and lids unremarkable. Cardiovascular: Regular rhythm.  Lungs: Normal work of breathing. Neurologic: No focal deficits.   Lab Results  Component Value Date   CREATININE 1.04 (H) 10/07/2023   BUN 14 10/07/2023   NA 141 10/07/2023   K 3.6 10/07/2023   CL 106 10/07/2023   CO2 27 10/07/2023   Lab Results  Component Value Date   ALT 11 10/07/2023   AST 20 10/07/2023   ALKPHOS 100 10/07/2023   BILITOT 0.5 10/07/2023   No results found for: "HGBA1C" No results found for: "INSULIN" No results found for: "TSH" Lab Results  Component Value Date   CHOL 124 10/29/2022   HDL 49.70 10/29/2022   LDLCALC 62 10/29/2022   TRIG 65.0 10/29/2022   CHOLHDL 3 10/29/2022   Lab Results  Component Value Date   WBC 3.9 (L) 10/07/2023   HGB 11.2 (L) 10/07/2023   HCT 32.5 (L) 10/07/2023   MCV 82.3 10/07/2023   PLT 207 10/07/2023   Lab Results  Component Value Date   IRON 69 10/07/2023   TIBC 255 10/07/2023   FERRITIN 69 10/07/2023    Attestation Statements:  Reviewed by clinician on day of visit:  allergies, medications, problem list, medical history, surgical history, family history, social history, and previous encounter notes.  Time spent on visit including pre-visit chart review and post-visit charting and care was 45 minutes.   Glennis Brink, DO

## 2023-10-17 NOTE — Assessment & Plan Note (Signed)
Reviewed testing results from her indirect calorimetry.  Her metabolic rate is 540 cal lower than the expected at 1310 kcal/day versus 1719 kcal/day as expected.  This is likely due to inactivity, untreated sleep apnea, a poor diet with lack of lean protein intake.  We discussed working on improving food choices with lean protein and fiber with meals, reducing excess starches and sweets, increasing level of physical activity over time to include both cardio and resistance training and evaluating for potentially OSA.  Will aim for good 7 to 8 hours of restful sleep at night.

## 2023-10-17 NOTE — Assessment & Plan Note (Signed)
Epworth sleepiness score: 9.  She notes snoring, daytime somnolence and frequent nighttime awakenings.  She has never had a polysomnogram.  She does appear to be high risk for obstructive sleep apnea with a large neck circumference and a low metabolic rate with a BMI of 43 and central adiposity.  Referral made to [facility] neurology for polysomnogram

## 2023-10-17 NOTE — Assessment & Plan Note (Signed)
She notes waking up 5-6 times each night to urinate.  She denies any change in her leg edema in the morning or at night but does have chronic venous stasis.  She also notes drinking all the way up until bedtime including things like Pepsi and sweet tea.  We talked about reducing nighttime drinking, cutting off fluid intake around 7 PM while discouraging intake of tea and soda.  Encouraged increased water intake earlier in the morning.

## 2023-10-17 NOTE — Assessment & Plan Note (Signed)
She has chronic bilateral lower extremity edema.  She has been unable to wear compression socks to work though she stands for about 6 hours at the time due to leg pain from sciatica.  She denies any improving leg edema first thing in the morning.  She does note problems with frequent urination at night.  Begin active plan for weight reduction.  Follow-up with vascular surgery for further treatment options.

## 2023-10-17 NOTE — Assessment & Plan Note (Signed)
BP is elevated today She reports taking valsartan 160 mg daily and atenolol 50 mg daily but has been inconsistent with the timing of her doses due to her schedule  Continue all anti hypertensive meds as directed Look for BP improvements with weight loss Set a reminder alarm to remember to take meds

## 2023-10-17 NOTE — Assessment & Plan Note (Signed)
Reviewed EKG findings which are unchanged from her last EKG 08/12/2017 in her chart.  She has findings of LVH, LAD, sinus bradycardia and QRS widening.  She is asymptomatic without chest pain, heart palpitations or dyspnea on exertion.  She reports her last stress test was done in 2012 and her last echocardiogram was done over 5 years ago.  She has a listed diagnosis of cardiomegaly.  Since she will be starting our program of medically supervised weight management which will include increased physical activity, will refer to Dr. Jens Som for cardiac testing.  Begin active plan for weight reduction. Notably, patient does not qualify for use of a GLP-1 receptor agonist due to lack of insurance coverage.

## 2023-10-18 LAB — TSH RFX ON ABNORMAL TO FREE T4: TSH: 2.24 u[IU]/mL (ref 0.450–4.500)

## 2023-10-18 LAB — HEMOGLOBIN A1C
Est. average glucose Bld gHb Est-mCnc: 100 mg/dL
Hgb A1c MFr Bld: 5.1 % (ref 4.8–5.6)

## 2023-10-18 LAB — LIPID PANEL
Chol/HDL Ratio: 2.3 {ratio} (ref 0.0–4.4)
Cholesterol, Total: 150 mg/dL (ref 100–199)
HDL: 64 mg/dL (ref >39)
LDL Chol Calc (NIH): 73 mg/dL (ref 0–99)
Triglycerides: 65 mg/dL (ref 0–149)
VLDL Cholesterol Cal: 13 mg/dL (ref 5–40)

## 2023-10-18 LAB — FOLATE: Folate: 7.2 ng/mL (ref >3.0)

## 2023-10-18 LAB — INSULIN, RANDOM: INSULIN: 14 u[IU]/mL (ref 2.6–24.9)

## 2023-10-18 LAB — VITAMIN B12: Vitamin B-12: 258 pg/mL (ref 232–1245)

## 2023-10-18 LAB — VITAMIN D 25 HYDROXY (VIT D DEFICIENCY, FRACTURES): Vit D, 25-Hydroxy: 5.4 ng/mL — ABNORMAL LOW (ref 30.0–100.0)

## 2023-10-25 ENCOUNTER — Encounter: Payer: Self-pay | Admitting: General Practice

## 2023-10-28 ENCOUNTER — Ambulatory Visit (INDEPENDENT_AMBULATORY_CARE_PROVIDER_SITE_OTHER): Payer: BC Managed Care – PPO | Admitting: Family Medicine

## 2023-11-05 ENCOUNTER — Encounter: Payer: Self-pay | Admitting: Family Medicine

## 2023-11-05 ENCOUNTER — Ambulatory Visit: Payer: BC Managed Care – PPO | Admitting: Family Medicine

## 2023-11-05 VITALS — BP 132/82 | HR 54 | Temp 98.1°F | Ht 64.0 in | Wt 247.0 lb

## 2023-11-05 DIAGNOSIS — E66813 Obesity, class 3: Secondary | ICD-10-CM

## 2023-11-05 DIAGNOSIS — G4730 Sleep apnea, unspecified: Secondary | ICD-10-CM | POA: Diagnosis not present

## 2023-11-05 DIAGNOSIS — I10 Essential (primary) hypertension: Secondary | ICD-10-CM

## 2023-11-05 DIAGNOSIS — E559 Vitamin D deficiency, unspecified: Secondary | ICD-10-CM

## 2023-11-05 DIAGNOSIS — Z6841 Body Mass Index (BMI) 40.0 and over, adult: Secondary | ICD-10-CM

## 2023-11-05 MED ORDER — VITAMIN D (ERGOCALCIFEROL) 1.25 MG (50000 UNIT) PO CAPS: 50000.0000 [IU] | ORAL_CAPSULE | ORAL | 0 refills | Status: DC

## 2023-11-05 NOTE — Assessment & Plan Note (Signed)
Last vitamin D Lab Results  Component Value Date   VD25OH 5.4 (L) 10/17/2023   New diagnosis.  We discussed her lab findings of severe vitamin D deficiency.  We discussed that vitamin D deficiency can lead to fatigue, bone loss and poor immune function.  She is currently not taking a vitamin D supplement.  Bone density testing should be done every 2 years.  Begin vitamin D 50,000 IU twice weekly.  Recheck level in 3 months

## 2023-11-05 NOTE — Assessment & Plan Note (Signed)
A referral was placed at her last visit for neurology referral to Dr. Vickey Huger for polysomnogram.  She has not yet been called to schedule.  She has been working on increasing her sleep hygiene and working towards 8 hours of sleep at night.  She still reports daytime somnolence.  Keep upcoming visit, working on scheduling this with Dr. Vickey Huger.

## 2023-11-05 NOTE — Progress Notes (Signed)
Office: [phone]  /  Fax: [phone]  WEIGHT SUMMARY AND BIOMETRICS  Starting Date: 10/17/23  Starting Weight: 253lb   Weight Lost Since Last Visit: 6lb   Vitals Temp: 98.1 F (36.7 C) BP: 132/82 Pulse Rate: (!) 54 SpO2: 100 %   Body Composition  Body Fat %: 52 % Fat Mass (lbs): 128.6 lbs Muscle Mass (lbs): 112.8 lbs Total Body Water (lbs): 82.6 lbs Visceral Fat Rating : 18    HPI  Chief Complaint: OBESITY  Candice [name] is here to discuss her progress with her obesity treatment plan. She is on the the Category 2 Plan and states she is following her eating plan approximately 70 % of the time. She states she is exercising 0 minutes 0 times per week.  Interval History:  Since last office visit she is down 6 lb She is down 0.2 lb of muscle mass and is down 5.6 lb of body fat She is liking most of the food on her meal plan She is feeling adequately full at the end of her meals She has been able to practice mindful eating around treats and sweets She does well with snacks on her meal plan She has cut back on pepsi intake and has room to increase water intake  Pharmacotherapy: none  PHYSICAL EXAM:  Blood pressure 132/82, pulse (!) 54, temperature 98.1 F (36.7 C), height 5\' 4"  (1.626 m), weight 247 lb (112 kg), SpO2 100%. Body mass index is 42.4 kg/m.  General: She is overweight, cooperative, alert, well developed, and in no acute distress. PSYCH: Has normal mood, affect and thought process.   Lungs: Normal breathing effort, no conversational dyspnea.  ASSESSMENT AND PLAN  TREATMENT PLAN FOR OBESITY:  Recommended Dietary Goals  Katlyn is currently in the action stage of change. As such, her goal is to continue weight management plan. She has agreed to the Category 2 Plan. Additional food options reviewed together: She may increase fruit to 2 servings per day (any, fresh or frozen) She can add one high fiber carb serving to dinner She can alternate a low  sugar oatmeal + high protein milk, topped with fresh fruit/ nuts for breakfast  Behavioral Intervention  We discussed the following Behavioral Modification Strategies today: increasing lean protein intake to established goals, increasing vegetables, increasing lower glycemic fruits, increasing fiber rich foods, avoiding skipping meals, increasing water intake , work on meal planning and preparation, keeping healthy foods at home, identifying sources and decreasing liquid calories, avoiding temptations and identifying enticing environmental cues, continue to practice mindfulness when eating, planning for success, and continue to work on maintaining a reduced calorie state, getting the recommended amount of protein, incorporating whole foods, making healthy choices, staying well hydrated and practicing mindfulness when eating..  Additional resources provided today: NA  Recommended Physical Activity Goals  [name] has been advised to work up to 150 minutes of moderate intensity aerobic activity a week and strengthening exercises 2-3 times per week for cardiovascular health, weight loss maintenance and preservation of muscle mass.   She has agreed to Increase the intensity, frequency or duration of aerobic exercises   She agrees to a goal of indoor bike riding 15 min + weights 10 min 3 days/ wk  Pharmacotherapy changes for the treatment of obesity: none  ASSOCIATED CONDITIONS ADDRESSED TODAY  Vitamin D deficiency Assessment & Plan: Last vitamin D Lab Results  Component Value Date   VD25OH 5.4 (L) 10/17/2023   New diagnosis.  We discussed her lab findings of  severe vitamin D deficiency.  We discussed that vitamin D deficiency can lead to fatigue, bone loss and poor immune function.  She is currently not taking a vitamin D supplement.  Bone density testing should be done every 2 years.  Begin vitamin D 50,000 IU twice weekly.  Recheck level in 3 months  Orders: -     Vitamin D  (Ergocalciferol); Take 1 capsule (50,000 Units total) by mouth 2 (two) times a week.  Dispense: 8 capsule; Refill: 0  Class 3 severe obesity due to excess calories with serious comorbidity and body mass index (BMI) of 40.0 to 44.9 in adult [facility])  Sleep-disordered breathing Assessment & Plan: A referral was placed at her last visit for neurology referral to Dr. Vickey Huger for polysomnogram.  She has not yet been called to schedule.  She has been working on increasing her sleep hygiene and working towards 8 hours of sleep at night.  She still reports daytime somnolence.  Keep upcoming visit, working on scheduling this with Dr. Vickey Huger.   Essential hypertension Assessment & Plan: Blood pressure is well-controlled today She is taking valsartan 160 mg daily and atenolol 50 mg once daily.  She has been referred to cardiology and is scheduled for 12/19/2023 with Dr Maddireddy for abnormal EKG findings.  She denies chest pain, heart palpitations or dyspnea on exertion.  Continue active plan for weight reduction.  Continue current medications and keep upcoming visit as scheduled with cardiology         She was informed of the importance of frequent follow up visits to maximize her success with intensive lifestyle modifications for her multiple health conditions.   ATTESTASTION STATEMENTS:  Reviewed by clinician on day of visit: allergies, medications, problem list, medical history, surgical history, family history, social history, and previous encounter notes pertinent to obesity diagnosis.   I have personally spent 30 minutes total time today in preparation, patient care, nutritional counseling and documentation for this visit, including the following: review of clinical lab tests; review of medical tests/procedures/services.      Glennis Brink, DO DABFM, DABOM [facility] [address] [place], [place] [address] [phone]

## 2023-11-05 NOTE — Assessment & Plan Note (Signed)
Blood pressure is well-controlled today She is taking valsartan 160 mg daily and atenolol 50 mg once daily.  She has been referred to cardiology and is scheduled for 12/19/2023 with Dr Maddireddy for abnormal EKG findings.  She denies chest pain, heart palpitations or dyspnea on exertion.  Continue active plan for weight reduction.  Continue current medications and keep upcoming visit as scheduled with cardiology

## 2023-11-05 NOTE — Patient Instructions (Addendum)
Begin RX vitamin D 2 x a week  Begin a Multivitamin daily  Continue to wean off the soda You can try Vitamin Water ZERO sugar  See food swap outs  In Jan, start with 15 min of BIKE + 10-15 min of weights 3 days/ wk  KEEP UP THE GOOD WORK!

## 2023-12-03 ENCOUNTER — Ambulatory Visit: Payer: 59 | Admitting: Family Medicine

## 2023-12-03 ENCOUNTER — Encounter: Payer: Self-pay | Admitting: Family Medicine

## 2023-12-03 VITALS — BP 181/94 | HR 59 | Temp 97.7°F | Ht 64.0 in | Wt 245.0 lb

## 2023-12-03 DIAGNOSIS — E559 Vitamin D deficiency, unspecified: Secondary | ICD-10-CM | POA: Diagnosis not present

## 2023-12-03 DIAGNOSIS — Z6841 Body Mass Index (BMI) 40.0 and over, adult: Secondary | ICD-10-CM

## 2023-12-03 DIAGNOSIS — G4730 Sleep apnea, unspecified: Secondary | ICD-10-CM

## 2023-12-03 DIAGNOSIS — R7989 Other specified abnormal findings of blood chemistry: Secondary | ICD-10-CM | POA: Insufficient documentation

## 2023-12-03 DIAGNOSIS — E66813 Obesity, class 3: Secondary | ICD-10-CM

## 2023-12-03 DIAGNOSIS — I10 Essential (primary) hypertension: Secondary | ICD-10-CM | POA: Diagnosis not present

## 2023-12-03 HISTORY — DX: Other specified abnormal findings of blood chemistry: R79.89

## 2023-12-03 MED ORDER — B-12 500 MCG PO TABS: ORAL_TABLET | ORAL | 0 refills | Status: DC

## 2023-12-03 MED ORDER — VITAMIN D (ERGOCALCIFEROL) 1.25 MG (50000 UNIT) PO CAPS: 50000.0000 [IU] | ORAL_CAPSULE | ORAL | 0 refills | Status: DC

## 2023-12-03 NOTE — Progress Notes (Signed)
Office: [phone]  /  Fax: [phone]  WEIGHT SUMMARY AND BIOMETRICS  Starting Date: 10/17/23  Starting Weight: 253lb   Weight Lost Since Last Visit: 2lb   Vitals Temp: 97.7 F (36.5 C) BP: (!) 181/94 Pulse Rate: (!) 59 SpO2: 100 %   Body Composition  Body Fat %: 52.4 % Fat Mass (lbs): 128.8 lbs Muscle Mass (lbs): 111 lbs Total Body Water (lbs): 85.8 lbs Visceral Fat Rating : 18    HPI  Chief Complaint: OBESITY  Candice [name] is here to discuss her progress with her obesity treatment plan. She is on the the Category 2 Plan and states she is following her eating plan approximately 50 % of the time. She states she is exercising 0 minutes 0 times per week.  Interval History:  Since last office visit she is down 2 lb She has a net weight loss of 8 lb in the past month Someone brought her family pizza and cookie pie this weekend She is still feeling tired and has not yet scheduled her sleep study She has not yet started home exercise She is getting in some fruits and veggies She has cut back on Pepsi to 16 oz/ day She has cut back on meals out  She has not considered bariatric surgery Her insurance does not cover GLP-1's  Pharmacotherapy: none  PHYSICAL EXAM:  Blood pressure (!) 181/94, pulse (!) 59, temperature 97.7 F (36.5 C), height 5\' 4"  (1.626 m), weight 245 lb (111.1 kg), SpO2 100%. Body mass index is 42.05 kg/m.  General: She is overweight, cooperative, alert, well developed, and in no acute distress. PSYCH: Has normal mood, affect and thought process.   Lungs: Normal breathing effort, no conversational dyspnea.   ASSESSMENT AND PLAN  TREATMENT PLAN FOR OBESITY:  Recommended Dietary Goals  Eulalah is currently in the action stage of change. As such, her goal is to continue weight management plan. She has agreed to the Category 2 Plan.  Behavioral Intervention  We discussed the following Behavioral Modification Strategies today: increasing lean  protein intake to established goals, increasing vegetables, increasing lower glycemic fruits, increasing fiber rich foods, increasing water intake , work on meal planning and preparation, keeping healthy foods at home, identifying sources and decreasing liquid calories, avoiding temptations and identifying enticing environmental cues, and continue to work on maintaining a reduced calorie state, getting the recommended amount of protein, incorporating whole foods, making healthy choices, staying well hydrated and practicing mindfulness when eating..  Additional resources provided today: NA  Recommended Physical Activity Goals  Gracelynn has been advised to work up to 150 minutes of moderate intensity aerobic activity a week and strengthening exercises 2-3 times per week for cardiovascular health, weight loss maintenance and preservation of muscle mass.   She has agreed to Think about enjoyable ways to increase daily physical activity and overcoming barriers to exercise and Increase physical activity in their day and reduce sedentary time (increase NEAT).  Pharmacotherapy changes for the treatment of obesity: none  ASSOCIATED CONDITIONS ADDRESSED TODAY  Vitamin D deficiency Assessment & Plan: Last vitamin D Lab Results  Component Value Date   VD25OH 5.4 (L) 10/17/2023   She is doing well on RX vitamin D 2 x a week Energy level remains low Denies adverse SE  Continue RX vitamin D 2 x a week Repeat level in March  Orders: -     Vitamin D (Ergocalciferol); Take 1 capsule (50,000 Units total) by mouth 2 (two) times a week.  Dispense: 8  capsule; Refill: 0  Class 3 severe obesity due to excess calories with serious comorbidity and body mass index (BMI) of 40.0 to 44.9 in adult [facility])  Essential hypertension Assessment & Plan: Blood pressure is elevated today.  She reports not taking her morning blood pressure medication yet today.  She denies chest pain, heart palpitations or shortness of  breath.  She reports good compliance taking valsartan 160 mg daily and atenolol 50 mg once daily.  Continue all antihypertensive medication.  Reminded her of good compliance.  Continue active plan for weight reduction.     Sleep-disordered breathing Assessment & Plan: Referral to sleep medicine has been made but patient has not yet completed her visit.  She is planning to call to schedule. Phone number provided.  Continue to work on good sleep hygiene, 8 hours of sleep at night and keep upcoming visit with sleep medicine.  Continue active plan for weight reduction.   Low vitamin B12 level Assessment & Plan: Lab Results  Component Value Date   VITAMINB12 258 10/17/2023   Patient had a low normal B12 level on her labs from 12/5.  She is not taking a B12 supplement.  She limits her intake of red meat due to gout.  She denies paresthesias or brain fog but complains of fatigue.  Begin vitamin B12 500 mcg once daily.  Recheck level in 3 months  Orders: -     B-12; Take once daily  Dispense: 30 tablet; Refill: 0      She was informed of the importance of frequent follow up visits to maximize her success with intensive lifestyle modifications for her multiple health conditions.   ATTESTASTION STATEMENTS:  Reviewed by clinician on day of visit: allergies, medications, problem list, medical history, surgical history, family history, social history, and previous encounter notes pertinent to obesity diagnosis.   I have personally spent 30 minutes total time today in preparation, patient care, nutritional counseling and documentation for this visit, including the following: review of clinical lab tests; review of medical tests/procedures/services.      Glennis Brink, DO DABFM, DABOM [facility] [address] [place], [place] [address] [phone]

## 2023-12-03 NOTE — Assessment & Plan Note (Signed)
Referral to sleep medicine has been made but patient has not yet completed her visit.  She is planning to call to schedule. Phone number provided.  Continue to work on good sleep hygiene, 8 hours of sleep at night and keep upcoming visit with sleep medicine.  Continue active plan for weight reduction.

## 2023-12-03 NOTE — Assessment & Plan Note (Signed)
Blood pressure is elevated today.  She reports not taking her morning blood pressure medication yet today.  She denies chest pain, heart palpitations or shortness of breath.  She reports good compliance taking valsartan 160 mg daily and atenolol 50 mg once daily.  Continue all antihypertensive medication.  Reminded her of good compliance.  Continue active plan for weight reduction.

## 2023-12-03 NOTE — Assessment & Plan Note (Signed)
Lab Results  Component Value Date   VITAMINB12 258 10/17/2023   Patient had a low normal B12 level on her labs from 12/5.  She is not taking a B12 supplement.  She limits her intake of red meat due to gout.  She denies paresthesias or brain fog but complains of fatigue.  Begin vitamin B12 500 mcg once daily.  Recheck level in 3 months

## 2023-12-03 NOTE — Assessment & Plan Note (Signed)
Last vitamin D Lab Results  Component Value Date   VD25OH 5.4 (L) 10/17/2023   She is doing well on RX vitamin D 2 x a week Energy level remains low Denies adverse SE  Continue RX vitamin D 2 x a week Repeat level in March

## 2023-12-17 ENCOUNTER — Other Ambulatory Visit: Payer: Self-pay

## 2023-12-17 DIAGNOSIS — R609 Edema, unspecified: Secondary | ICD-10-CM | POA: Insufficient documentation

## 2023-12-17 DIAGNOSIS — M2550 Pain in unspecified joint: Secondary | ICD-10-CM | POA: Insufficient documentation

## 2023-12-17 DIAGNOSIS — T7840XA Allergy, unspecified, initial encounter: Secondary | ICD-10-CM | POA: Insufficient documentation

## 2023-12-17 DIAGNOSIS — I10 Essential (primary) hypertension: Secondary | ICD-10-CM | POA: Insufficient documentation

## 2023-12-17 DIAGNOSIS — R011 Cardiac murmur, unspecified: Secondary | ICD-10-CM | POA: Insufficient documentation

## 2023-12-19 ENCOUNTER — Ambulatory Visit: Payer: 59

## 2023-12-19 VITALS — BP 148/100 | HR 57 | Ht 64.0 in | Wt 249.1 lb

## 2023-12-19 DIAGNOSIS — I452 Bifascicular block: Secondary | ICD-10-CM

## 2023-12-19 DIAGNOSIS — I10 Essential (primary) hypertension: Secondary | ICD-10-CM

## 2023-12-19 DIAGNOSIS — R011 Cardiac murmur, unspecified: Secondary | ICD-10-CM

## 2023-12-19 HISTORY — DX: Bifascicular block: I45.2

## 2023-12-19 MED ORDER — CARVEDILOL 12.5 MG PO TABS: 12.5000 mg | ORAL_TABLET | Freq: Two times a day (BID) | ORAL | 3 refills | Status: DC

## 2023-12-19 NOTE — Patient Instructions (Signed)
 Medication Instructions:  Your physician has recommended you make the following change in your medication:   Stop Atenolol    Start Carvedilol  12.5 mg twice daily.  *If you need a refill on your cardiac medications before your next appointment, please call your pharmacy*   Lab Work: None ordered If you have labs (blood work) drawn today and your tests are completely normal, you will receive your results only by: MyChart Message (if you have MyChart) OR A paper copy in the mail If you have any lab test that is abnormal or we need to change your treatment, we will call you to review the results.   Testing/Procedures: Your physician has requested that you have an echocardiogram. Echocardiography is a painless test that uses sound waves to create images of your heart. It provides your doctor with information about the size and shape of your heart and how well your heart's chambers and valves are working. This procedure takes approximately one hour. There are no restrictions for this procedure. Please do NOT wear cologne, perfume, aftershave, or lotions (deodorant is allowed). Please arrive 15 minutes prior to your appointment time.  Please note: We ask at that you not bring children with you during ultrasound (echo/ vascular) testing. Due to room size and safety concerns, children are not allowed in the ultrasound rooms during exams. Our front office staff cannot provide observation of children in our lobby area while testing is being conducted. An adult accompanying a patient to their appointment will only be allowed in the ultrasound room at the discretion of the ultrasound technician under special circumstances. We apologize for any inconvenience.   Follow-Up: At [facility], you and your health needs are our priority.  As part of our continuing mission to provide you with exceptional heart care, we have created designated Provider Care Teams.  These Care Teams include your primary  Cardiologist (physician) and Advanced Practice Providers (APPs -  Physician Assistants and Nurse Practitioners) who all work together to provide you with the care you need, when you need it.  We recommend signing up for the patient portal called MyChart.  Sign up information is provided on this After Visit Summary.  MyChart is used to connect with patients for Virtual Visits (Telemedicine).  Patients are able to view lab/test results, encounter notes, upcoming appointments, etc.  Non-urgent messages can be sent to your provider as well.   To learn more about what you can do with MyChart, go to [email or web address].    Your next appointment:   3 month(s)  The format for your next appointment:   In Person  Provider:   Alean Madireddy, MD   Other Instructions Echocardiogram An echocardiogram is a test that uses sound waves (ultrasound) to produce images of the heart. Images from an echocardiogram can provide important information about: Heart size and shape. The size and thickness and movement of your heart's walls. Heart muscle function and strength. Heart valve function or if you have stenosis. Stenosis is when the heart valves are too narrow. If blood is flowing backward through the heart valves (regurgitation). A tumor or infectious growth around the heart valves. Areas of heart muscle that are not working well because of poor blood flow or injury from a heart attack. Aneurysm detection. An aneurysm is a weak or damaged part of an artery wall. The wall bulges out from the normal force of blood pumping through the body. Tell a health care provider about: Any allergies you have. All medicines you are taking,  including vitamins, herbs, eye drops, creams, and over-the-counter medicines. Any blood disorders you have. Any surgeries you have had. Any medical conditions you have. Whether you are pregnant or may be pregnant. What are the risks? Generally, this is a safe test.  However, problems may occur, including an allergic reaction to dye (contrast) that may be used during the test. What happens before the test? No specific preparation is needed. You may eat and drink normally. What happens during the test? You will take off your clothes from the waist up and put on a hospital gown. Electrodes or electrocardiogram (ECG)patches may be placed on your chest. The electrodes or patches are then connected to a device that monitors your heart rate and rhythm. You will lie down on a table for an ultrasound exam. A gel will be applied to your chest to help sound waves pass through your skin. A handheld device, called a transducer, will be pressed against your chest and moved over your heart. The transducer produces sound waves that travel to your heart and bounce back (or echo back) to the transducer. These sound waves will be captured in real-time and changed into images of your heart that can be viewed on a video monitor. The images will be recorded on a computer and reviewed by your health care provider. You may be asked to change positions or hold your breath for a short time. This makes it easier to get different views or better views of your heart. In some cases, you may receive contrast through an IV in one of your veins. This can improve the quality of the pictures from your heart. The procedure may vary among health care providers and hospitals.   What can I expect after the test? You may return to your normal, everyday life, including diet, activities, and medicines, unless your health care provider tells you not to do that. Follow these instructions at home: It is up to you to get the results of your test. Ask your health care provider, or the department that is doing the test, when your results will be ready. Keep all follow-up visits. This is important. Summary An echocardiogram is a test that uses sound waves (ultrasound) to produce images of the heart. Images  from an echocardiogram can provide important information about the size and shape of your heart, heart muscle function, heart valve function, and other possible heart problems. You do not need to do anything to prepare before this test. You may eat and drink normally. After the echocardiogram is completed, you may return to your normal, everyday life, unless your health care provider tells you not to do that. This information is not intended to replace advice given to you by your health care provider. Make sure you discuss any questions you have with your health care provider. Document Revised: 06/21/2020 Document Reviewed: 06/21/2020 Elsevier Patient Education  2021 [employer]   Important Information About Sugar

## 2023-12-19 NOTE — Assessment & Plan Note (Signed)
 EKG findings of bifascicular block are noted and discussed with her. Evaluation cardiac structure and function with echocardiogram as being scheduled.

## 2023-12-19 NOTE — Assessment & Plan Note (Addendum)
 Continue with blood pressure management, target below 130 over 80 mmHg. Continue valsartan  160 mg once daily. Switch atenolol  to carvedilol  12.5 mg twice daily. Advised her to keep a log of blood pressure readings at home and notify us  or her PCP.  Will obtain transthoracic echocardiogram for interval assessment for any significant changes.    Advised to undergo evaluation for sleep apnea, she tells me this is in the works through her PCP.

## 2023-12-19 NOTE — Assessment & Plan Note (Signed)
 Further evaluate for cardiac structure and function assessment with transthoracic echocardiogram given the heart murmur which appears to be new and she was not aware of this before.

## 2023-12-19 NOTE — Progress Notes (Signed)
 Cardiology Consultation:    Date:  12/19/2023   ID:  Candice [name], DOB [birth year], MRN [id number]  PCP:  Frann Mabel Mt, DO  Cardiologist:  Alean SAUNDERS Zayquan Bogard, MD   Referring MD: Frann Mabel Mt*   No chief complaint on file.    ASSESSMENT AND PLAN:   [name] 63 year old with history of obesity, hypertension, discoid lupus, gout, prior echocardiogram with moderate concentric left ventricular hypertrophy grade 2 diastolic dysfunction and possible patent foramen ovale in 2016, here for further management in the setting of bifascicular block noted on EKG and for blood pressure management.   Problem List Items Addressed This Visit     Essential hypertension - Primary   Continue with blood pressure management, target below 130 over 80 mmHg. Continue valsartan  160 mg once daily. Switch atenolol  to carvedilol  12.5 mg twice daily. Advised her to keep a log of blood pressure readings at home and notify us  or her PCP.  Will obtain transthoracic echocardiogram for interval assessment for any significant changes.    Advised to undergo evaluation for sleep apnea, she tells me this is in the works through her PCP.      Relevant Medications   carvedilol  (COREG ) 12.5 MG tablet   Other Relevant Orders   EKG 12-Lead (Completed)   ECHOCARDIOGRAM COMPLETE   Systolic murmur   Further evaluate for cardiac structure and function assessment with transthoracic echocardiogram given the heart murmur which appears to be new and she was not aware of this before.      Bifascicular block   EKG findings of bifascicular block are noted and discussed with her. Evaluation cardiac structure and function with echocardiogram as being scheduled.      Relevant Medications   carvedilol  (COREG ) 12.5 MG tablet   Will have her return to clinic for follow-up in 3 months tentatively.   History of Present Illness:    Candice [name] is a 63 y.o. female who is being seen today for the  evaluation of abnormal EKG and hypertension management at the request of Frann Mabel Mt*.   Has history of obesity, hypertension, discoid lupus, gout, prior echocardiogram in 2016 reportedly with moderate concentric left ventricular hypertrophy and grade 2 diastolic dysfunction and possible patent foramen ovale.  Here for evaluation in the setting of EKG as noted at her weight clinic to show abnormal findings.  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  Lives with her granddaughter at home.  No significant physical exercise routinely. Denies any significant cardiac symptoms.  Does report shortness of breath with moderate exertion.  Had gained over 20 pounds in the past year.  Has modified her diet and lost 4 pounds in the past month.  Denies any chest pain, palpitations, lightheadedness, dizziness or syncopal episodes.  Blood pressures at home have been suboptimally controlled with systolic ranging between 130s to 140s and diastolic above 80s. She is currently taking atenolol  and valsartan .  Has been taking colchicine  daily.  Mentions this is for gout management.  Recommended she discuss with rheumatologist regarding further requirements for colchicine  as her last Flareup was over 6 weeks ago.  She was recently switched from Plaquenil  to azathioprine.  She does not smoke. Rare social alcohol consumption. No illicit drug use.  Family history mother with heart disease in her 87s.   EKG in the clinic today shows sinus rhythm heart rate 57/min, QRS duration 124 ms  with RBBB plus LAFB morphology.  Normal PR interval 178 ms  Prior echocardiogram report from January 2016 noted moderate LVH with grade 2 diastolic dysfunction, LVEF 67%, moderately dilated left atrium with possible patent foramina ovale .  Last lipid panel and blood work from October 17, 2023 reviewed.  Past  Medical History:  Diagnosis Date   Abnormal EKG 10/08/2017   Sinus rhythm, LVH, interventricular conduction delay, with left anterior hemiblock.     Acute maxillary sinusitis 09/30/2018   Allergic urticaria 06/23/2013   Allergist Dr Fleeta Smock     Allergy    Anemia    Asymptomatic varicose veins of right lower extremity 09/06/2022   Cardiomegaly 11/06/2014   Carpal tunnel syndrome of right wrist 01/01/2018   Central adiposity 10/08/2023   Chronic venous insufficiency 10/09/2022   Class 3 severe obesity due to excess calories with serious comorbidity and body mass index (BMI) of 40.0 to 44.9 in adult [facility]) 10/17/2023   Contact dermatitis 07/16/2012   Discoid lupus 06/01/2019   Edema    Essential hypertension 08/19/2013   [facility]. 12/02/2014.  Gordy Bergamo, MD.  Normal global wall motion.  Diastolic dysfunction.  EF 67%.  Left atrial cavity is moderately dilated.  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  No significant valvular abnormality present.       Gout    great right toe   Heart murmur    Hypertension    Hypokalemia 10/03/2011   Joint pain    Left ventricular hypertrophy due to hypertensive disease 10/08/2017   Low basal metabolic rate 10/17/2023   Low vitamin B12 level 12/03/2023   Morbid obesity (HCC) 10/03/2011   Nocturia 10/17/2023   Skin lesion of left ear 03/16/2016   Skin lesion of right ear 03/16/2016   Sleep-disordered breathing 10/17/2023   Vitamin D  deficiency 11/12/2011    Past Surgical History:  Procedure Laterality Date   OVARIAN CYST REMOVAL Left    20 yrs ago   TUBAL LIGATION      Current Medications: Current Meds  Medication Sig   azaTHIOprine (IMURAN) 50 MG tablet Take 50 mg by mouth daily.   carvedilol  (COREG ) 12.5 MG tablet Take 1 tablet (12.5 mg total) by mouth 2 (two) times daily.   colchicine  0.6 MG tablet TAKE 1 TABLET BY MOUTH EVERY DAY   Cyanocobalamin (B-12) 500 MCG TABS Take once daily   Emollient (EUCERIN) lotion  Apply 1 Application topically as needed for dry skin. Apply topically onto the skin as needed for dry skin.   fluticasone  (FLONASE ) 50 MCG/ACT nasal spray Place 2 sprays into both nostrils daily.   folic acid  (FOLVITE ) 1 MG tablet TAKE 2 TABLETS BY MOUTH EVERY DAY   levocetirizine (XYZAL ) 5 MG tablet Take 1 tablet (5 mg total) by mouth every evening.   Multiple Vitamins-Minerals (PRESERVISION AREDS 2) CAPS Take 2 capsules by mouth daily.   probenecid (BENEMID) 500 MG tablet Take 500 mg by mouth 2 (two) times daily.   triamcinolone  ointment (KENALOG ) 0.1 % Apply 1 application topically as needed.   valsartan  (DIOVAN ) 160 MG tablet Take 1 tablet (160 mg total) by mouth daily.   Vitamin D , Ergocalciferol , (DRISDOL ) 1.25 MG (50000 UNIT) CAPS capsule Take 1 capsule (50,000 Units total) by mouth 2 (two) times a week.   [DISCONTINUED] atenolol  (TENORMIN ) 50 MG tablet TAKE 1 TABLET BY MOUTH EVERY DAY     Allergies:   Ace inhibitors and Allopurinol    Social History   Socioeconomic History  Marital status: Single    Spouse name: Not on file   Number of children: Not on file   Years of education: Not on file   Highest education level: Not on file  Occupational History   Not on file  Tobacco Use   Smoking status: Never   Smokeless tobacco: Never  Vaping Use   Vaping status: Never Used  Substance and Sexual Activity   Alcohol use: No   Drug use: No   Sexual activity: Not Currently    Birth control/protection: Surgical  Other Topics Concern   Not on file  Social History Narrative   Not on file   Social Drivers of Health   Financial Resource Strain: Not on file  Food Insecurity: Not on file  Transportation Needs: Not on file  Physical Activity: Not on file  Stress: Not on file  Social Connections: Not on file     Family History: The patient's family history includes Alcoholism in her father; Aneurysm in her sister; Diabetes in her mother; Heart disease in her mother and sister;  Hypertension in her mother; Kidney disease in her mother; Obesity in her mother; Sleep apnea in her mother; Stroke in her mother; Sudden death in her mother. There is no history of Colon cancer, Colon polyps, Esophageal cancer, Rectal cancer, Stomach cancer, or Pancreatic cancer. ROS:   Please see the history of present illness.    All 14 point review of systems negative except as described per history of present illness.  EKGs/Labs/Other Studies Reviewed:    The following studies were reviewed today:   EKG:  EKG Interpretation Date/Time:  Thursday December 19 2023 08:54:21 EST Ventricular Rate:  57 PR Interval:  178 QRS Duration:  124 QT Interval:  464 QTC Calculation: 451 R Axis:   -77  Text Interpretation: Sinus bradycardia Right bundle branch block Left anterior fascicular block Bifascicular block Septal infarct , age undetermined No previous ECGs available Confirmed by Liborio Hai reddy [phone]) on 12/19/2023 9:15:21 AM    Recent Labs: 10/07/2023: ALT 11; BUN 14; Creatinine 1.04; Hemoglobin 11.2; Platelet Count 207; Potassium 3.6; Sodium 141 10/17/2023: TSH 2.240  Recent Lipid Panel    Component Value Date/Time   CHOL 150 10/17/2023 1036   TRIG 65 10/17/2023 1036   HDL 64 10/17/2023 1036   CHOLHDL 2.3 10/17/2023 1036   CHOLHDL 3 10/29/2022 1339   VLDL 13.0 10/29/2022 1339   LDLCALC 73 10/17/2023 1036    Physical Exam:    VS:  BP (!) 148/100 (BP Location: Left Arm, Patient Position: Sitting, Cuff Size: Normal)   Pulse (!) 57   Ht 5' 4 (1.626 m)   Wt 249 lb 1.3 oz (113 kg)   SpO2 98%   BMI 42.75 kg/m     Wt Readings from Last 3 Encounters:  12/19/23 249 lb 1.3 oz (113 kg)  12/03/23 245 lb (111.1 kg)  11/05/23 247 lb (112 kg)     GENERAL:  Well nourished, well developed in no acute distress NECK: No JVD; No carotid bruits CARDIAC: RRR, S1 and S2 present, 4/6 harsh ejection systolic murmur best heard in right upper sternal border. CHEST:  Clear to  auscultation without rales, wheezing or rhonchi  Extremities: No pitting pedal edema. Pulses bilaterally symmetric with radial 2+ and dorsalis pedis 2+ NEUROLOGIC:  Alert and oriented x 3  Medication Adjustments/Labs and Tests Ordered: Current medicines are reviewed at length with the patient today.  Concerns regarding medicines are outlined above.  Orders Placed This  Encounter  Procedures   EKG 12-Lead   ECHOCARDIOGRAM COMPLETE   Meds ordered this encounter  Medications   carvedilol  (COREG ) 12.5 MG tablet    Sig: Take 1 tablet (12.5 mg total) by mouth 2 (two) times daily.    Dispense:  180 tablet    Refill:  3    Signed, Beonka Amesquita reddy Amran Malter, MD, MPH, [facility]. 12/19/2023 9:36 AM    [facility]

## 2023-12-23 ENCOUNTER — Ambulatory Visit: Payer: BC Managed Care – PPO | Admitting: Internal Medicine

## 2023-12-30 ENCOUNTER — Other Ambulatory Visit: Payer: Self-pay

## 2023-12-30 DIAGNOSIS — I452 Bifascicular block: Secondary | ICD-10-CM

## 2023-12-30 DIAGNOSIS — I10 Essential (primary) hypertension: Secondary | ICD-10-CM

## 2023-12-30 DIAGNOSIS — R011 Cardiac murmur, unspecified: Secondary | ICD-10-CM

## 2024-01-02 ENCOUNTER — Other Ambulatory Visit: Payer: Self-pay | Admitting: Family Medicine

## 2024-01-02 ENCOUNTER — Ambulatory Visit: Payer: 59 | Admitting: Family Medicine

## 2024-01-02 DIAGNOSIS — R7989 Other specified abnormal findings of blood chemistry: Secondary | ICD-10-CM

## 2024-01-02 DIAGNOSIS — E559 Vitamin D deficiency, unspecified: Secondary | ICD-10-CM

## 2024-01-07 ENCOUNTER — Ambulatory Visit: Payer: 59 | Admitting: Family Medicine

## 2024-01-07 ENCOUNTER — Encounter: Payer: Self-pay | Admitting: Family Medicine

## 2024-01-07 ENCOUNTER — Ambulatory Visit (HOSPITAL_BASED_OUTPATIENT_CLINIC_OR_DEPARTMENT_OTHER)
Admission: RE | Admit: 2024-01-07 | Discharge: 2024-01-07 | Disposition: A | Discharge status: Home / Self Care | Payer: 59 | Source: Ambulatory Visit

## 2024-01-07 VITALS — BP 165/98 | HR 78 | Temp 98.0°F | Ht 64.0 in | Wt 241.0 lb

## 2024-01-07 DIAGNOSIS — E559 Vitamin D deficiency, unspecified: Secondary | ICD-10-CM | POA: Diagnosis not present

## 2024-01-07 DIAGNOSIS — E66813 Obesity, class 3: Secondary | ICD-10-CM | POA: Diagnosis not present

## 2024-01-07 DIAGNOSIS — R7989 Other specified abnormal findings of blood chemistry: Secondary | ICD-10-CM

## 2024-01-07 DIAGNOSIS — Z6841 Body Mass Index (BMI) 40.0 and over, adult: Secondary | ICD-10-CM

## 2024-01-07 DIAGNOSIS — I10 Essential (primary) hypertension: Secondary | ICD-10-CM

## 2024-01-07 DIAGNOSIS — R011 Cardiac murmur, unspecified: Secondary | ICD-10-CM | POA: Insufficient documentation

## 2024-01-07 LAB — ECHOCARDIOGRAM COMPLETE
AR max vel: 1.6 cm2
AV Area VTI: 1.62 cm2
AV Area mean vel: 1.47 cm2
AV Mean grad: 9 mm[Hg]
AV Peak grad: 14.1 mm[Hg]
AV Vena cont: 0.2 cm
Ao pk vel: 1.88 m/s
Area-P 1/2: 4.41 cm2
Calc EF: 64.6 %
Height: 64 in
MV M vel: 2.47 m/s
MV Peak grad: 24.4 mm[Hg]
P 1/2 time: 2151 ms
S' Lateral: 3 cm
Single Plane A2C EF: 69.5 %
Single Plane A4C EF: 61.9 %
Weight: 3856 [oz_av]

## 2024-01-07 MED ORDER — B-12 500 MCG PO TABS: ORAL_TABLET | ORAL | 0 refills | Status: DC

## 2024-01-07 MED ORDER — VITAMIN D (ERGOCALCIFEROL) 1.25 MG (50000 UNIT) PO CAPS: 50000.0000 [IU] | ORAL_CAPSULE | ORAL | 0 refills | Status: DC

## 2024-01-07 NOTE — Progress Notes (Signed)
 Office: [phone]  /  Fax: [phone]  WEIGHT SUMMARY AND BIOMETRICS  Starting Date: 10/17/23  Starting Weight: 253lb   Weight Lost Since Last Visit: 4lb   Vitals Temp: 98 F (36.7 C) BP: (!) 165/98 Pulse Rate: 78 SpO2: 100 %   Body Composition  Body Fat %: 51.9 % Fat Mass (lbs): 125.2 lbs Muscle Mass (lbs): 110.4 lbs Total Body Water (lbs): 83.2 lbs Visceral Fat Rating : 17   HPI  Chief Complaint: OBESITY  Candice [name] is here to discuss her progress with her obesity treatment plan. She is on the the Category 2 Plan and states she is following her eating plan approximately 50 % of the time. She states she is exercising 0 minutes 0 times per week.  Interval History:  Since last office visit she is down 4 lb She has a net weight loss of 12 lb in 2 mos This is a 4.8% total body weight loss with diet and exercise changes alone Hunger and cravings are under fair control She has not yet started to exercise yet She has cut back on Pepsi to 24 oz/ day She is drinking some water and regular lemonade She is bringing food to work Her granddaughter is eating with her She is on prednisone for parotid inflammation - has 3 more days  Pharmacotherapy: None  PHYSICAL EXAM:  Blood pressure (!) 165/98, pulse 78, temperature 98 F (36.7 C), height 5\' 4"  (1.626 m), weight 241 lb (109.3 kg), SpO2 100%. Body mass index is 41.37 kg/m.  General: She is overweight, cooperative, alert, well developed, and in no acute distress. PSYCH: Has normal mood, affect and thought process.   Lungs: Normal breathing effort, no conversational dyspnea.   ASSESSMENT AND PLAN  TREATMENT PLAN FOR OBESITY:  Recommended Dietary Goals  Candice [name] is currently in the action stage of change. As such, her goal is to continue weight management plan. She has agreed to the Category 2 Plan.  Behavioral Intervention  We discussed the following Behavioral Modification Strategies today: increasing lean  protein intake to established goals, increasing fiber rich foods, increasing water intake , keeping healthy foods at home, identifying sources and decreasing liquid calories, practice mindfulness eating and understand the difference between hunger signals and cravings, work on managing stress, creating time for self-care and relaxation, avoiding temptations and identifying enticing environmental cues, continue to work on implementation of reduced calorie nutritional plan, planning for success, and continue to work on maintaining a reduced calorie state, getting the recommended amount of protein, incorporating whole foods, making healthy choices, staying well hydrated and practicing mindfulness when eating..  Additional resources provided today: NA  Recommended Physical Activity Goals  Candice [name] has been advised to work up to 150 minutes of moderate intensity aerobic activity a week and strengthening exercises 2-3 times per week for cardiovascular health, weight loss maintenance and preservation of muscle mass.   She has agreed to Think about enjoyable ways to increase daily physical activity and overcoming barriers to exercise and Increase physical activity in their day and reduce sedentary time (increase NEAT). She prefers to exercise at home  Pharmacotherapy changes for the treatment of obesity: None  ASSOCIATED CONDITIONS ADDRESSED TODAY  Essential hypertension Assessment & Plan: Seeing Dr Madireddy Changed from atenolol to carvedilol  Has been taking valsartan 160 mg daily, reporting good compliance Blood pressure is elevated today Denies chest pain or headaches She is scheduled for echocardiogram today per cardiology  Avoid use of any stimulant type antiobesity medication and continue  active plan for weight reduction   Vitamin D deficiency Last vitamin D Lab Results  Component Value Date   VD25OH 5.4 (L) 10/17/2023   She had a profoundly low vitamin D level in December.  She has  been taking vitamin D 50,000 IU twice weekly.  Her energy level is starting to improve. Repeat vitamin D level in 1 to 2 months  -     Vitamin D (Ergocalciferol); Take 1 capsule (50,000 Units total) by mouth 2 (two) times a week.  Dispense: 8 capsule; Refill: 0  Low vitamin B12 level Lab Results  Component Value Date   VITAMINB12 258 10/17/2023  She has started vitamin B12 500 mcg once daily She denies brain fog or paresthesias.  Energy level is starting to improve Repeat B12 level in 1 to 2 months  -     B-12; Take once daily  Dispense: 30 tablet; Refill: 0  Class 3 severe obesity due to excess calories with serious comorbidity and body mass index (BMI) of 40.0 to 44.9 in adult [facility])      She was informed of the importance of frequent follow up visits to maximize her success with intensive lifestyle modifications for her multiple health conditions.   ATTESTASTION STATEMENTS:  Reviewed by clinician on day of visit: allergies, medications, problem list, medical history, surgical history, family history, social history, and previous encounter notes pertinent to obesity diagnosis.   I have personally spent 30 minutes total time today in preparation, patient care, nutritional counseling and documentation for this visit, including the following: review of clinical lab tests; review of medical tests/procedures/services.      Glennis Brink, DO DABFM, DABOM [facility] [address] [place], [place] [address] [phone]

## 2024-01-07 NOTE — Assessment & Plan Note (Signed)
 Seeing Dr Madireddy Changed from atenolol to carvedilol  Has been taking valsartan 160 mg daily

## 2024-01-16 ENCOUNTER — Other Ambulatory Visit: Payer: Self-pay | Admitting: Family Medicine

## 2024-01-16 DIAGNOSIS — Z1231 Encounter for screening mammogram for malignant neoplasm of breast: Secondary | ICD-10-CM

## 2024-01-23 ENCOUNTER — Ambulatory Visit
Admission: RE | Admit: 2024-01-23 | Discharge: 2024-01-23 | Disposition: A | Discharge status: Home / Self Care | Source: Ambulatory Visit | Attending: Family Medicine | Admitting: Family Medicine

## 2024-01-23 DIAGNOSIS — Z1231 Encounter for screening mammogram for malignant neoplasm of breast: Secondary | ICD-10-CM

## 2024-02-06 ENCOUNTER — Encounter: Payer: Self-pay | Admitting: Family Medicine

## 2024-02-06 ENCOUNTER — Ambulatory Visit: Payer: 59 | Admitting: Family Medicine

## 2024-02-06 VITALS — BP 186/104 | HR 59 | Temp 98.0°F | Ht 64.0 in | Wt 239.0 lb

## 2024-02-06 DIAGNOSIS — Z6841 Body Mass Index (BMI) 40.0 and over, adult: Secondary | ICD-10-CM

## 2024-02-06 DIAGNOSIS — R7989 Other specified abnormal findings of blood chemistry: Secondary | ICD-10-CM | POA: Diagnosis not present

## 2024-02-06 DIAGNOSIS — E559 Vitamin D deficiency, unspecified: Secondary | ICD-10-CM | POA: Diagnosis not present

## 2024-02-06 DIAGNOSIS — G4730 Sleep apnea, unspecified: Secondary | ICD-10-CM

## 2024-02-06 DIAGNOSIS — E66813 Obesity, class 3: Secondary | ICD-10-CM

## 2024-02-06 DIAGNOSIS — I10 Essential (primary) hypertension: Secondary | ICD-10-CM

## 2024-02-06 MED ORDER — B-12 500 MCG PO TABS: ORAL_TABLET | ORAL | 0 refills | Status: DC

## 2024-02-06 MED ORDER — VITAMIN D (ERGOCALCIFEROL) 1.25 MG (50000 UNIT) PO CAPS: 50000.0000 [IU] | ORAL_CAPSULE | ORAL | 0 refills | Status: DC

## 2024-02-06 NOTE — Patient Instructions (Addendum)
 Take all BP meds Continue to monitor home Bps at rest  Work on tracking intake using the MyNet Diary Ap Aim for 1200 cal /day This should include 85-110 g of protein daily Get in fresh or frozen fruits and veggies daily Opt for low sugar dairy options (< 8 g of sugar per serving)  Continue to stay OFF of the regular sodas Labs today Results will be sent to MyChart in the next few days  Once gout improves, add in 20 min of walking daily

## 2024-02-06 NOTE — Progress Notes (Signed)
 Office: [phone]  /  Fax: [phone]  WEIGHT SUMMARY AND BIOMETRICS  Starting Date: 10/17/23  Starting Weight: 253lb   Weight Lost Since Last Visit: 2lb   Vitals Temp: 98 F (36.7 C) BP: (!) 186/104 Pulse Rate: (!) 59 SpO2: 100 %   Body Composition  Body Fat %: 51 % Fat Mass (lbs): 122 lbs Muscle Mass (lbs): 111.4 lbs Total Body Water (lbs): 82.6 lbs Visceral Fat Rating : 17     HPI  Chief Complaint: OBESITY  Candice [name] is here to discuss her progress with her obesity treatment plan. She is on the the Category 2 Plan and states she is following her eating plan approximately 60 % of the time. She states she is exercising 0 minutes 0 times per week.  Interval History:  Since last office visit she is down 2 lb She is up 1 pound of muscle mass and down 3.2 pounds of body fat since last visit She has been on prednisone for gout since early Feb and has been hungrier and craving sweets This gives her a net weight loss of 14 lb in 3 mos of medically supervised weight management This is a 5.5% total body weight loss She is working on cutting back on Pepsi intake She has not been doing any regular exercise due to a flareup of gout She has not yet scheduled her visit with [facility] neurology for sleep study  Pharmacotherapy: None  PHYSICAL EXAM:  Blood pressure (!) 186/104, pulse (!) 59, temperature 98 F (36.7 C), height 5\' 4"  (1.626 m), weight 239 lb (108.4 kg), SpO2 100%. Body mass index is 41.02 kg/m.  General: She is overweight, cooperative, alert, well developed, and in no acute distress. PSYCH: Has normal mood, affect and thought process.   Lungs: Normal breathing effort, no conversational dyspnea.   ASSESSMENT AND PLAN  TREATMENT PLAN FOR OBESITY:  Recommended Dietary Goals  Candice [name] is currently in the action stage of change. As such, her goal is to continue weight management plan. She has agreed to the Category 2 Plan. She may track her calorie  intake using my net diary at 1200 cal/day which should include 85 to 110 g of protein daily  Behavioral Intervention  We discussed the following Behavioral Modification Strategies today: increasing lean protein intake to established goals, increasing fiber rich foods, increasing water intake , work on meal planning and preparation, work on tracking and journaling calories using tracking application, keeping healthy foods at home, identifying sources and decreasing liquid calories, continue to practice mindfulness when eating, planning for success, and continue to work on maintaining a reduced calorie state, getting the recommended amount of protein, incorporating whole foods, making healthy choices, staying well hydrated and practicing mindfulness when eating..  Additional resources provided today: NA  Recommended Physical Activity Goals  Candice [name] has been advised to work up to 150 minutes of moderate intensity aerobic activity a week and strengthening exercises 2-3 times per week for cardiovascular health, weight loss maintenance and preservation of muscle mass.   She has agreed to Think about enjoyable ways to increase daily physical activity and overcoming barriers to exercise and Increase physical activity in their day and reduce sedentary time (increase NEAT). Increase walking time once gout improved.    Pharmacotherapy changes for the treatment of obesity: None  ASSOCIATED CONDITIONS ADDRESSED TODAY  Essential hypertension Blood pressure remains poorly controlled.  She did not take her morning medications today.  She has been monitoring blood pressures at home and reports running  systolic readings 130s to 140s with diastolics 80s to 90s.  She denies headache or chest pain.  She denies leg edema.  Encouraged improved compliance taking carvedilol 12.5 mg twice daily, valsartan 160 mg once daily and follow-up closely with PCP.  Continue monitoring resting blood pressure at home 1-2 times a  week.  Continue active plan for weight reduction.   Low vitamin B12 level Lab Results  Component Value Date   VITAMINB12 258 10/17/2023  She has been taking vitamin B12 500 mcg once daily.  She denies paresthesias or brain fog.  Her energy level is starting to improve.  Recheck vitamin B12 level today  -     Vitamin B12 -     B-12; Take once daily  Dispense: 30 tablet; Refill: 0  Vitamin D deficiency Last vitamin D Lab Results  Component Value Date   VD25OH 5.4 (L) 10/17/2023  Her vitamin D level was very low on last labs in December.  She has been taking vitamin D 50,000 IU twice weekly.  Her energy level is starting to improve.  Recheck level today.  -     VITAMIN D 25 Hydroxy (Vit-D Deficiency, Fractures) -     Vitamin D (Ergocalciferol); Take 1 capsule (50,000 Units total) by mouth every 7 (seven) days.  Dispense: 12 capsule; Refill: 0  Class 3 severe obesity due to excess calories with serious comorbidity and body mass index (BMI) of 40.0 to 44.9 in adult [facility]) Patient lacked insurance coverage for antiobesity medication limiting options such as Saxenda, Wegovy or Zepbound.  Her BMI remains over 40 and she is not currently interested in weight loss surgery.  She is doing fairly well on her prescribed dietary plan but has yet to add in regular exercise.  Avoid use of stimulant type antiobesity medication given her hypertension.  Additional goals reviewed with patient on after visit summary today  Sleep-disordered breathing Patient appears to be high risk for OSA.  She has not yet called back [facility] neurology following her referral message.  Phone number was provided for patient to schedule this visit today.     She was informed of the importance of frequent follow up visits to maximize her success with intensive lifestyle modifications for her multiple health conditions.   ATTESTASTION STATEMENTS:  Reviewed by clinician on day of visit: allergies, medications, problem list,  medical history, surgical history, family history, social history, and previous encounter notes pertinent to obesity diagnosis.   I have personally spent 30 minutes total time today in preparation, patient care, nutritional counseling and documentation for this visit, including the following: review of clinical lab tests; review of medical tests/procedures/services.      Glennis Brink, DO DABFM, DABOM [facility] [address] [place], [place] [address] [phone]

## 2024-02-07 LAB — VITAMIN B12: Vitamin B-12: 710 pg/mL (ref 232–1245)

## 2024-02-07 LAB — VITAMIN D 25 HYDROXY (VIT D DEFICIENCY, FRACTURES): Vit D, 25-Hydroxy: 41.5 ng/mL (ref 30.0–100.0)

## 2024-02-17 ENCOUNTER — Other Ambulatory Visit: Payer: Self-pay | Admitting: Family Medicine

## 2024-03-10 ENCOUNTER — Emergency Department (HOSPITAL_BASED_OUTPATIENT_CLINIC_OR_DEPARTMENT_OTHER)

## 2024-03-10 ENCOUNTER — Encounter (HOSPITAL_BASED_OUTPATIENT_CLINIC_OR_DEPARTMENT_OTHER): Payer: Self-pay | Admitting: Emergency Medicine

## 2024-03-10 ENCOUNTER — Ambulatory Visit: Admitting: Family Medicine

## 2024-03-10 ENCOUNTER — Emergency Department (HOSPITAL_BASED_OUTPATIENT_CLINIC_OR_DEPARTMENT_OTHER)
Admission: EM | Admit: 2024-03-10 | Discharge: 2024-03-10 | Disposition: A | Discharge status: Home / Self Care | Attending: Emergency Medicine | Admitting: Emergency Medicine

## 2024-03-10 ENCOUNTER — Other Ambulatory Visit: Payer: Self-pay

## 2024-03-10 DIAGNOSIS — M79604 Pain in right leg: Secondary | ICD-10-CM | POA: Diagnosis present

## 2024-03-10 DIAGNOSIS — L03115 Cellulitis of right lower limb: Secondary | ICD-10-CM | POA: Diagnosis not present

## 2024-03-10 DIAGNOSIS — Z79899 Other long term (current) drug therapy: Secondary | ICD-10-CM | POA: Diagnosis not present

## 2024-03-10 DIAGNOSIS — M19011 Primary osteoarthritis, right shoulder: Secondary | ICD-10-CM | POA: Diagnosis not present

## 2024-03-10 DIAGNOSIS — I10 Essential (primary) hypertension: Secondary | ICD-10-CM | POA: Insufficient documentation

## 2024-03-10 MED ORDER — OXYCODONE-ACETAMINOPHEN 5-325 MG PO TABS: 1.0000 | ORAL_TABLET | Freq: Four times a day (QID) | ORAL | 0 refills | Status: AC | PRN

## 2024-03-10 MED ORDER — OXYCODONE-ACETAMINOPHEN 5-325 MG PO TABS
1.0000 | ORAL_TABLET | Freq: Once | ORAL | Status: AC
  Administered 2024-03-10: 1 via ORAL
  Filled 2024-03-10: qty 1

## 2024-03-10 MED ORDER — CEPHALEXIN 250 MG PO CAPS
500.0000 mg | ORAL_CAPSULE | Freq: Once | ORAL | Status: AC
  Administered 2024-03-10: 500 mg via ORAL
  Filled 2024-03-10: qty 2

## 2024-03-10 MED ORDER — CEPHALEXIN 500 MG PO CAPS: 500.0000 mg | ORAL_CAPSULE | Freq: Four times a day (QID) | ORAL | 0 refills | Status: AC

## 2024-03-10 NOTE — ED Provider Notes (Signed)
 [place] [facility] AT [facility] Provider Note   CSN: [id number] Arrival date & time: 03/10/24  1954     History  Chief Complaint  Patient presents with   Leg Pain    Candice [name] is a 63 y.o. female with a history of hypertension, discoid lupus, and chronic venous insufficiency who presents to the ED today for leg pain.  Patient reports pain, swelling, and warmth to touch of the right lower leg for the past several months.  Denies any injury or trauma prior to onset of pain.  Also reports pain with movement of the right shoulder.  Denies any injury or trauma to that either.  She is been taking Tylenol with some improvement of symptoms.  Denies any chest pain or shortness of breath.  No weakness, numbness or tingling.  She is not on blood thinners.    Home Medications Prior to Admission medications   Medication Sig Start Date End Date Taking? Authorizing Provider  cephALEXin  (KEFLEX ) 500 MG capsule Take 1 capsule (500 mg total) by mouth 4 (four) times daily for 7 days. 03/10/24 03/17/24 Yes Sonnie Dusky, PA-C  oxyCODONE-acetaminophen (PERCOCET/ROXICET) 5-325 MG tablet Take 1 tablet by mouth every 6 (six) hours as needed for up to 3 days for severe pain (pain score 7-10). 03/10/24 03/13/24 Yes Sonnie Dusky, PA-C  azaTHIOprine (IMURAN) 50 MG tablet Take 50 mg by mouth daily. 12/13/23   [provider]  carvedilol  (COREG ) 12.5 MG tablet Take 1 tablet (12.5 mg total) by mouth 2 (two) times daily. 12/19/23 03/18/24  Madireddy, Daymon Evans, MD  colchicine  0.6 MG tablet TAKE 1 TABLET BY MOUTH EVERY DAY 01/18/20   Saguier, Gaylin Ke, PA-C  Cyanocobalamin (B-12) 500 MCG TABS Take once daily 02/06/24   Bowen, Karen E, DO  Emollient (EUCERIN) lotion Apply 1 Application topically as needed for dry skin. Apply topically onto the skin as needed for dry skin.    [provider]  fluticasone  (FLONASE ) 50 MCG/ACT nasal spray Place 2 sprays into both nostrils daily. 05/19/19    Jobe Mulder, DO  folic acid  (FOLVITE ) 1 MG tablet TAKE 2 TABLETS BY MOUTH EVERY DAY 04/11/23   Ivor Mars, MD  levocetirizine (XYZAL ) 5 MG tablet Take 1 tablet (5 mg total) by mouth every evening. 10/15/23   Jobe Mulder, DO  Multiple Vitamins-Minerals (PRESERVISION AREDS 2) CAPS Take 2 capsules by mouth daily. 11/26/22   [provider]  predniSONE  (DELTASONE ) 5 MG tablet Take by mouth. 02/04/24   [provider]  probenecid (BENEMID) 500 MG tablet Take 500 mg by mouth 2 (two) times daily. 09/02/19   [provider]  triamcinolone  ointment (KENALOG ) 0.1 % Apply 1 application topically as needed. 02/07/21   Jobe Mulder, DO  valsartan  (DIOVAN ) 160 MG tablet Take 1 tablet (160 mg total) by mouth daily. 10/15/23   Jobe Mulder, DO  Vitamin D , Ergocalciferol , (DRISDOL ) 1.25 MG (50000 UNIT) CAPS capsule Take 1 capsule (50,000 Units total) by mouth every 7 (seven) days. 02/06/24   Bowen, Leeta Puls, DO      Allergies    Ace inhibitors and Allopurinol     Review of Systems   Review of Systems  Musculoskeletal:        Leg pain/swelling  All other systems reviewed and are negative.   Physical Exam Updated Vital Signs BP (!) 146/92   Pulse 61   Temp 99.2 F (37.3 C) (Oral)   Resp 16  Ht 5\' 4"  (1.626 m)   Wt 108.9 kg   SpO2 99%   BMI 41.20 kg/m  Physical Exam Vitals and nursing note reviewed.  Constitutional:      General: She is not in acute distress.    Appearance: Normal appearance.  HENT:     Head: Normocephalic and atraumatic.     Mouth/Throat:     Mouth: Mucous membranes are moist.  Eyes:     Conjunctiva/sclera: Conjunctivae normal.     Pupils: Pupils are equal, round, and reactive to light.  Cardiovascular:     Rate and Rhythm: Normal rate and regular rhythm.     Pulses: Normal pulses.     Heart sounds: Normal heart sounds.  Pulmonary:     Effort: Pulmonary effort is normal.     Breath sounds: Normal  breath sounds.  Abdominal:     Palpations: Abdomen is soft.     Tenderness: There is no abdominal tenderness.  Musculoskeletal:        General: Swelling and tenderness present. Normal range of motion.     Cervical back: Normal range of motion.     Comments: Tenderness and swelling to the right lower calf with warmth to touch.  No tenderness to palpation of the right shoulder but with range of motion of the shoulder.  Strength and sensation of upper and lower extremities intact bilaterally.  Skin:    General: Skin is warm and dry.     Findings: No rash.  Neurological:     General: No focal deficit present.     Mental Status: She is alert.     Sensory: No sensory deficit.     Motor: No weakness.  Psychiatric:        Mood and Affect: Mood normal.        Behavior: Behavior normal.    ED Results / Procedures / Treatments   Labs (all labs ordered are listed, but only abnormal results are displayed) Labs Reviewed - No data to display  EKG None  Radiology DG Shoulder Right Result Date: 03/10/2024 CLINICAL DATA:  Right shoulder pain EXAM: RIGHT SHOULDER - 2+ VIEW COMPARISON:  None Available. FINDINGS: Normal alignment. No acute fracture or dislocation. There is mild degenerative arthritis of the acromioclavicular articulation with inferiorly oriented osteophytes noted. Limited evaluation of the right hemithorax is unremarkable. IMPRESSION: 1. Mild acromioclavicular degenerative arthritis with inferiorly oriented osteophytes. Electronically Signed   By: Worthy Heads M.D.   On: 03/10/2024 21:47   US  Venous Img Lower Unilateral Right Result Date: 03/10/2024 CLINICAL DATA:  Right leg pain, bruising EXAM: RIGHT LOWER EXTREMITY VENOUS DOPPLER ULTRASOUND TECHNIQUE: Gray-scale sonography with compression, as well as color and duplex ultrasound, were performed to evaluate the deep venous system(s) from the level of the common femoral vein through the popliteal and proximal calf veins. COMPARISON:   None Available. FINDINGS: VENOUS Normal compressibility of the common femoral, superficial femoral, and popliteal veins, as well as the visualized calf veins. Visualized portions of profunda femoral vein and great saphenous vein unremarkable. No filling defects to suggest DVT on grayscale or color Doppler imaging. Doppler waveforms show normal direction of venous flow, normal respiratory plasticity and response to augmentation. Limited views of the contralateral common femoral vein are unremarkable. OTHER None. Limitations: none IMPRESSION: Negative. Electronically Signed   By: Worthy Heads M.D.   On: 03/10/2024 21:36    Procedures Procedures    Medications Ordered in ED Medications  oxyCODONE-acetaminophen (PERCOCET/ROXICET) 5-325 MG per tablet 1 tablet (  1 tablet Oral Given 03/10/24 2141)  cephALEXin  (KEFLEX ) capsule 500 mg (500 mg Oral Given 03/10/24 2148)    ED Course/ Medical Decision Making/ A&P                                 Medical Decision Making Amount and/or Complexity of Data Reviewed Radiology: ordered.  Risk Prescription drug management.   This patient presents to the ED for concern of right leg pain, this involves an extensive number of treatment options, and is a complaint that carries with it a high risk of complications and morbidity.   Differential diagnosis includes: DVT, cellulitis, allergic reaction, sprain/spasm, ligamentous injury, etc.   Comorbidities  See HPI above   Additional History  Additional history obtained from prior records   Imaging Studies  I ordered imaging studies including lower extremity venous ultrasound and right shoulder x-ray I independently visualized and interpreted imaging which showed:  Ultrasound negative for DVT Mild acromioclavicular degenerative arthritis with inferior oriented osteophytes. I agree with the radiologist interpretation   Problem List / ED Course / Critical Interventions / Medication  Management  Patient endorses right lower leg tenderness, swelling, and warmth to touch for the past month and a half.  She denies any injury or trauma to the leg prior to onset of symptoms.  No drainage from the leg.   any cuts or scratches to the leg.  Has not been take anything for the pain. I ordered medications including: Percocet for pain Keflex  for cellulitis I have reviewed the patients home medicines and have made adjustments as needed   Social Determinants of Health  Access to healthcare   Test / Admission - Considered  Discussed finds with patient.  All questions were answered. She is hemodynamically stable and safe discharge home. Return precautions given.       Final Clinical Impression(s) / ED Diagnoses Final diagnoses:  Cellulitis of right lower extremity  Arthritis of right shoulder    Rx / DC Orders ED Discharge Orders          Ordered    Lower Ext Right Venous US   Status:  Canceled       Comments: IMPORTANT PATIENT INSTRUCTIONS:  You have been scheduled for an Outpatient Ultrasound.    Your appointment has been scheduled for:  _______ am/pm on _______________ (date).  If your appointment is scheduled for a Saturday, Sunday or holiday, please go to the [facility] Emergency Department Registration Desk at least 15 minutes prior to your appointment time and tell them you are there for an ultrasound.    If your appointment is scheduled for a weekday (Monday - Friday), please go directly to the [facility] Radiology Department reception area at least 15 minutes prior to your appointment time and tell them you are there for an ultrasound.  Please call [phone] with questions.   03/10/24 2041    cephALEXin (KEFLEX) 500 MG capsule  4 times daily        03/10/24 2147    oxyCODONE-acetaminophen (PERCOCET/ROXICET) 5-325 MG tablet  Every 6 hours PRN        04 /29/25 2149              Sonnie Dusky, PA-C 03/10/24 2159    Hershel Los, MD 03/10/24 2207

## 2024-03-10 NOTE — Discharge Instructions (Addendum)
 As discussed, the ultrasound is negative for blood clot.  The swelling and pain is most likely related to a skin infection (cellulitis).  Take Keflex  4 times a day for the next 7 days.  Expect 2 to 3 days for antibiotics again, then he should be experiencing some pain relief.  Take oxycodone every 6 hours as needed for pain.  This medication can cause drowsiness, do not drive operate heavy machinery or take this medication.  Information for orthopedics follow-up with if you would like to right shoulder pain persist.  Get help right away if: You notice red streaks coming from the infected area. You notice the skin turns purple or black and falls off.

## 2024-03-10 NOTE — ED Triage Notes (Signed)
 Pt coming from home for issues regarding her right leg and right shoulder. Pt states she has been having skin issues on her right leg that are tender since mid February and shoulder pain that started a week ago.

## 2024-03-10 NOTE — ED Notes (Signed)
 Patient in U/S then xray.

## 2024-03-12 ENCOUNTER — Other Ambulatory Visit: Payer: Self-pay | Admitting: Family Medicine

## 2024-03-12 DIAGNOSIS — J302 Other seasonal allergic rhinitis: Secondary | ICD-10-CM

## 2024-03-14 ENCOUNTER — Other Ambulatory Visit: Payer: Self-pay | Admitting: Family Medicine

## 2024-03-14 DIAGNOSIS — R7989 Other specified abnormal findings of blood chemistry: Secondary | ICD-10-CM

## 2024-03-16 MED ORDER — VALSARTAN 320 MG PO TABS: 320.0000 mg | ORAL_TABLET | Freq: Every day | ORAL | 3 refills | Status: AC

## 2024-03-19 ENCOUNTER — Ambulatory Visit: Admitting: Neurology

## 2024-03-19 ENCOUNTER — Encounter: Payer: Self-pay | Admitting: Neurology

## 2024-03-19 ENCOUNTER — Ambulatory Visit: Payer: 59

## 2024-03-19 VITALS — BP 136/88 | HR 54 | Ht 64.0 in | Wt 240.8 lb

## 2024-03-19 VITALS — BP 124/77 | HR 57 | Ht 64.0 in | Wt 242.2 lb

## 2024-03-19 DIAGNOSIS — R351 Nocturia: Secondary | ICD-10-CM

## 2024-03-19 DIAGNOSIS — Z9189 Other specified personal risk factors, not elsewhere classified: Secondary | ICD-10-CM | POA: Diagnosis not present

## 2024-03-19 DIAGNOSIS — I10 Essential (primary) hypertension: Secondary | ICD-10-CM

## 2024-03-19 DIAGNOSIS — R0683 Snoring: Secondary | ICD-10-CM

## 2024-03-19 DIAGNOSIS — G4719 Other hypersomnia: Secondary | ICD-10-CM | POA: Diagnosis not present

## 2024-03-19 MED ORDER — AMLODIPINE BESYLATE 5 MG PO TABS: 5.0000 mg | ORAL_TABLET | Freq: Every day | ORAL | 3 refills | Status: DC

## 2024-03-19 NOTE — Progress Notes (Signed)
 Cardiology Consultation:    Date:  03/19/2024   ID:  Candice [name], DOB 01-11-1961, MRN [id number]  PCP:  Candice Mulder, DO  Cardiologist:  Candice Evans Noura Purpura, MD   Referring MD: Candice [name]*   No chief complaint on file.    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 Start amlodipine  5 mg once daily as blood pressures remains uncontrolled at the office visit today. Target blood pressure below 130/80 mmHg.       Relevant Medications   atenolol  (TENORMIN ) 100 MG tablet   amLODipine  (NORVASC ) 5 MG tablet   Return to clinic additively in 6 months. History of Present Illness:    Candice [name] is a 63 y.o. female who is being seen today for follow-up visit. PCP is Candice [name], Candice [name]*. Last visit with me in the office was 12/19/2023.  Pleasant woman here for the visit by herself.  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  Reviewed echocardiogram from 01-07-2024 noted normal biventricular function with moderate left  ventricular hypertrophy, LVEF 60 to 65%, moderately dilated left atrium, trace aortic insufficiency.  Mild tricuspid regurgitation.  Could not appreciate any PFO on color Doppler assessment of atrial septum.  She recently had a visit to the ER on 03-10-2024 for right lower extremity pain and swelling, evaluation with an ultrasound test shows no concerns for DVT. At skin eruptions on the right lower extremity which appear consistent with erythema nodosum.  Continues to follow-up with rheumatologist.  Mentions with carvedilol  she felt the erythema nodosum symptoms were prominent and also blood pressure was suboptimally controlled and hence switch back to atenolol  100 mg once daily. Continues to take valsartan . Mentions blood pressures at home typically well-controlled.  Suboptimal control today in the office. Denies any other cardiac symptoms at this time. Continues to work without any significant limitations.  Past Medical History:  Diagnosis Date   Abnormal EKG 10/08/2017   Sinus rhythm, LVH, interventricular conduction delay, with left anterior hemiblock.     Acute maxillary sinusitis 09/30/2018   Allergic urticaria 06/23/2013   Allergist Dr Candice [name]     Allergy    Anemia    Asymptomatic varicose veins of right lower extremity 09/06/2022   Bifascicular block 12/19/2023   Cardiomegaly 11/06/2014   Carpal tunnel syndrome of right wrist 01/01/2018   Central adiposity 10/08/2023   Chronic venous insufficiency 10/09/2022   Class 3 severe obesity due to excess calories with serious comorbidity and body mass index (BMI) of 40.0 to 44.9 in adult 10/17/2023   Contact dermatitis 07/16/2012   Discoid lupus 06/01/2019   Edema    Essential hypertension 08/19/2013   [facility]. 12/02/2014.  Candice Perl, MD.  Normal global wall motion.  Diastolic dysfunction.  EF 67%.  Left atrial cavity is moderately dilated.  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  No significant valvular  abnormality present.       Gout    great right toe   Heart murmur    Hypertension    Hypokalemia 10/03/2011   Joint pain    Left ventricular hypertrophy due to hypertensive disease 10/08/2017   Low basal metabolic rate 10/17/2023   Low vitamin B12 level 12/03/2023   Morbid obesity (HCC) 10/03/2011   Nocturia 10/17/2023   Skin lesion of left ear 03/16/2016   Skin lesion of right ear 03/16/2016   Sleep-disordered breathing 10/17/2023   Systolic murmur    Vitamin D  deficiency 11/12/2011    Past Surgical History:  Procedure Laterality Date   OVARIAN CYST REMOVAL Left    20 yrs ago   TUBAL LIGATION      Current Medications: Current Meds  Medication Sig   amLODipine  (NORVASC ) 5 MG tablet Take 1 tablet (5 mg total) by mouth daily.   atenolol  (TENORMIN ) 100 MG tablet Take 100 mg by mouth daily.   azaTHIOprine (IMURAN) 50 MG tablet Take 150 mg by mouth daily.   colchicine  0.6 MG tablet TAKE 1 TABLET BY MOUTH EVERY DAY   Cyanocobalamin (B-12) 500 MCG TABS Take once daily   Emollient (EUCERIN) lotion Apply 1 Application topically as needed for dry skin. Apply topically onto the skin as needed for dry skin.   fluticasone  (FLONASE ) 50 MCG/ACT nasal spray Place 2 sprays into both nostrils daily.   folic acid  (FOLVITE ) 1 MG tablet TAKE 2 TABLETS BY MOUTH EVERY DAY   levocetirizine (XYZAL ) 5 MG tablet Take 1 tablet (5 mg total) by mouth every evening.   Multiple Vitamins-Minerals (PRESERVISION AREDS 2) CAPS Take 2 capsules by mouth daily.   predniSONE  (DELTASONE ) 5 MG tablet Take by mouth.   probenecid (BENEMID) 500 MG tablet Take 500 mg by mouth 2 (two) times daily.   triamcinolone  ointment (KENALOG ) 0.1 % Apply 1 application topically as needed.   valsartan  (DIOVAN ) 320 MG tablet Take 1 tablet (320 mg total) by mouth daily.   Vitamin D , Ergocalciferol , (DRISDOL ) 1.25 MG (50000 UNIT) CAPS capsule Take 1 capsule (50,000 Units total) by mouth every 7 (seven) days.     Allergies:   Ace  inhibitors and Allopurinol    Social History   Socioeconomic History   Marital status: Single    Spouse name: Not on file   Number of children: Not on file   Years of education: Not on file   Highest education level: Not on file  Occupational History   Not on file  Tobacco Use   Smoking status: Never   Smokeless tobacco: Never  Vaping Use   Vaping status: Never Used  Substance and Sexual Activity   Alcohol use: No   Drug use: No   Sexual activity: Not Currently    Birth control/protection: Surgical  Other Topics Concern   Not on file  Social History Narrative   Not on file   Social Drivers of Health   Financial Resource Strain: Not on file  Food Insecurity: Not on file  Transportation Needs: Not on file  Physical Activity: Not on file  Stress: Not on file  Social Connections: Not on file     Family History: The patient's family history includes Alcoholism in her father; Aneurysm in her sister; Diabetes in her mother; Heart disease in her mother and  sister; Hypertension in her mother; Kidney disease in her mother; Obesity in her mother; Sleep apnea in her mother; Stroke in her mother; Sudden death in her mother. There is no history of Colon cancer, Colon polyps, Esophageal cancer, Rectal cancer, Stomach cancer, or Pancreatic cancer. ROS:   Please see the history of present illness.    All 14 point review of systems negative except as described per history of present illness.  EKGs/Labs/Other Studies Reviewed:    The following studies were reviewed today:   EKG:       Recent Labs: 10/07/2023: ALT 11; BUN 14; Creatinine 1.04; Hemoglobin 11.2; Platelet Count 207; Potassium 3.6; Sodium 141 10/17/2023: TSH 2.240  Recent Lipid Panel    Component Value Date/Time   CHOL 150 10/17/2023 1036   TRIG 65 10/17/2023 1036   HDL 64 10/17/2023 1036   CHOLHDL 2.3 10/17/2023 1036   CHOLHDL 3 10/29/2022 1339   VLDL 13.0 10/29/2022 1339   LDLCALC 73 10/17/2023 1036     Physical Exam:    VS:  BP 136/88   Pulse (!) 54   Ht 5\' 4"  (1.626 m)   Wt 240 lb 12.8 oz (109.2 kg)   SpO2 98%   BMI 41.33 kg/m     Wt Readings from Last 3 Encounters:  03/19/24 240 lb 12.8 oz (109.2 kg)  03/10/24 240 lb (108.9 kg)  02/06/24 239 lb (108.4 kg)     GENERAL:  Well nourished, well developed in no acute distress NECK: No JVD; No carotid bruits CARDIAC: RRR, S1 and S2 present, no murmurs, no rubs, no gallops CHEST:  Clear to auscultation without rales, wheezing or rhonchi  Extremities: No pitting pedal edema. Pulses bilaterally symmetric with radial 2+ and dorsalis pedis 2+ Signs of erythema nodosum on the right lower extremity healing.  NEUROLOGIC:  Alert and oriented x 3  Medication Adjustments/Labs and Tests Ordered: Current medicines are reviewed at length with the patient today.  Concerns regarding medicines are outlined above.  No orders of the defined types were placed in this encounter.  Meds ordered this encounter  Medications   amLODipine  (NORVASC ) 5 MG tablet    Sig: Take 1 tablet (5 mg total) by mouth daily.    Dispense:  180 tablet    Refill:  3    Signed, Jetty Berland reddy Sarya Linenberger, MD, MPH, [facility]. 03/19/2024 9:54 AM    [facility]

## 2024-03-19 NOTE — Patient Instructions (Signed)

## 2024-03-19 NOTE — Patient Instructions (Addendum)
 Medication Instructions:  Start amlodipine  5 mg once a day  *If you need a refill on your cardiac medications before your next appointment, please call your pharmacy*   Lab Work: None Ordered If you have labs (blood work) drawn today and your tests are completely normal, you will receive your results only by: MyChart Message (if you have MyChart) OR A paper copy in the mail If you have any lab test that is abnormal or we need to change your treatment, we will call you to review the results.   Testing/Procedures: None Ordered   Follow-Up: At [facility], you and your health needs are our priority.  As part of our continuing mission to provide you with exceptional heart care, we have created designated Provider Care Teams.  These Care Teams include your primary Cardiologist (physician) and Advanced Practice Providers (APPs -  Physician Assistants and Nurse Practitioners) who all work together to provide you with the care you need, when you need it.  We recommend signing up for the patient portal called "MyChart".  Sign up information is provided on this After Visit Summary.  MyChart is used to connect with patients for Virtual Visits (Telemedicine).  Patients are able to view lab/test results, encounter notes, upcoming appointments, etc.  Non-urgent messages can be sent to your provider as well.   To learn more about what you can do with MyChart, go to [email or web address].    Your next appointment:   6  month follow up

## 2024-03-19 NOTE — Progress Notes (Signed)
 Subjective:    Patient ID: Candice [name] is a 63 y.o. female.  HPI    Candice Fairy, MD, PhD [facility] [address] [address] [place], [place] [address]  Dear Dr. Ambrosio Junker,  I saw your patient, Candice [name], upon your kind request in my sleep clinic today for initial consultation of her sleep disorder, in particular, concern for underlying obstructive sleep apnea.  The patient is unaccompanied today.  As you know, Candice [name] is a 63 year old female with an underlying complex medical history of hypertension, chronic venous insufficiency, allergies, gout, heart murmur, hypokalemia, sickle cell trait, vitamin B12 deficiency, vitamin D  deficiency, anemia, cardiomegaly, abnormal EKG, lupus, erythema nodosum, edema, and severe obesity with a BMI of over 40, who reports snoring and excessive daytime somnolence as well as waking up with a sense of gasping for air.  Her Epworth sleepiness is 16 out of 24, fatigue severity score is 50 out of 63.  She reports a family apnea affecting her mom.  I reviewed your office note from 10/17/2023. She lives with her 55 year old granddaughter.  She does watch TV in her bedroom until sometimes late at night, she typically goes to bed around 10 PM.  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  She also works at [employer].  Rise time depends on her work schedule anywhere from 5:30 AM to 10:30 AM.  She has caffeine in the form of soda, 20 ounce per day.  She is not a smoker does not drink any alcohol.  She recently had pain and swelling and rash in her right leg and was diagnosed with cellulitis per emergency room but was given a prednisone  taper by her rheumatologist for the erythema nodosum.  She sees rheumatology, cardiology, and hematology.  She has significant nocturia about 6 times per average night.  She denies recurrent nocturnal or morning headaches.  She has lost about 15 pounds in  the last 4 months.  Her Past Medical History Is Significant For: Past Medical History:  Diagnosis Date   Abnormal EKG 10/08/2017   Sinus rhythm, LVH, interventricular conduction delay, with left anterior hemiblock.     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 12/02/2014.  Knox Perl, MD.  Normal global wall motion.  Diastolic dysfunction.  EF 67%.  Left atrial cavity is moderately dilated.  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  No significant valvular abnormality present.       Gout    great right toe   Heart murmur    Hypertension    Hypokalemia 10/03/2011   Joint pain    Left ventricular hypertrophy due to hypertensive disease 10/08/2017   Low basal metabolic rate 10/17/2023   Low vitamin B12 level 12/03/2023   Morbid obesity (HCC) 10/03/2011   Nocturia 10/17/2023   Skin lesion of  left ear 03/16/2016   Skin lesion of right ear 03/16/2016   Sleep-disordered breathing 10/17/2023   Systolic murmur    Vitamin D  deficiency 11/12/2011    Her Past Surgical History Is Significant For: Past Surgical History:  Procedure Laterality Date   OVARIAN CYST REMOVAL Left    20 yrs ago   TUBAL LIGATION      Her Family History Is Significant For: Family History  Problem Relation Age of Onset   Hypertension Mother    Diabetes Mother    Stroke Mother    Kidney disease Mother    Heart disease  Mother    Sudden death Mother    Sleep apnea Mother    Obesity Mother    Alcoholism Father    Heart disease Sister    Aneurysm Sister    Colon cancer Neg Hx    Colon polyps Neg Hx    Esophageal cancer Neg Hx    Rectal cancer Neg Hx    Stomach cancer Neg Hx    Pancreatic cancer Neg Hx     Her Social History Is Significant For: Social History   Socioeconomic History   Marital status: Single    Spouse name: Not on file   Number of children: Not on file   Years of education: Not on file   Highest education level: Not on file  Occupational History   Not on file  Tobacco Use   Smoking status: Never   Smokeless tobacco: Never  Vaping Use   Vaping status: Never Used  Substance and Sexual Activity   Alcohol use: No   Drug use: No   Sexual activity: Not Currently    Birth control/protection: Surgical  Other Topics Concern   Not on file  Social History Narrative   Not on file   Social Drivers of Health   Financial Resource Strain: Not on file  Food Insecurity: Not on file  Transportation Needs: Not on file  Physical Activity: Not on file  Stress: Not on file  Social Connections: Not on file    Her Allergies Are:  Allergies  Allergen Reactions   Ace Inhibitors Nausea And Vomiting   Allopurinol  Nausea Only    Chills, headache, sore throat, sweats.   :   Her Current Medications Are:  Outpatient Encounter Medications as of 03/19/2024  Medication Sig   atenolol  (TENORMIN ) 100 MG tablet Take 100 mg by mouth daily.   azaTHIOprine (IMURAN) 50 MG tablet Take 150 mg by mouth daily.   colchicine  0.6 MG tablet TAKE 1 TABLET BY MOUTH EVERY DAY   Cyanocobalamin (B-12) 500 MCG TABS Take once daily   Emollient (EUCERIN) lotion Apply 1 Application topically as needed for dry skin. Apply topically onto the skin as needed for dry skin.   fluticasone  (FLONASE ) 50 MCG/ACT nasal spray Place 2 sprays into both nostrils daily.   folic acid  (FOLVITE ) 1 MG tablet TAKE 2 TABLETS BY MOUTH  EVERY DAY   levocetirizine (XYZAL ) 5 MG tablet Take 1 tablet (5 mg total) by mouth every evening.   Multiple Vitamins-Minerals (PRESERVISION AREDS 2) CAPS Take 2 capsules by mouth daily.   predniSONE  (DELTASONE ) 5 MG tablet Take by mouth.   probenecid (BENEMID) 500 MG tablet Take 500 mg by mouth 2 (two) times daily.   triamcinolone  ointment (KENALOG ) 0.1 % Apply 1 application topically as needed.   valsartan  (DIOVAN ) 320 MG tablet Take 1 tablet (320 mg total) by mouth daily.   Vitamin D , Ergocalciferol , (DRISDOL ) 1.25 MG (  50000 UNIT) CAPS capsule Take 1 capsule (50,000 Units total) by mouth every 7 (seven) days.   amLODipine  (NORVASC ) 5 MG tablet Take 1 tablet (5 mg total) by mouth daily. (Patient not taking: Reported on 03/19/2024)   No facility-administered encounter medications on file as of 03/19/2024.  :   Review of Systems:  Out of a complete 14 point review of systems, all are reviewed and negative with the exception of these symptoms as listed below:   Review of Systems  Neurological:        Room 5 Pt is here Alone. Pt states that she has heard herself snore before. Pt states that she has had moments where she has woken up trying to catch her breath. Pt has a History of Sleep Apnea in her Family (Mother). ESS 16 FSS 50    Objective:  Neurological Exam  Physical Exam Physical Examination:   Vitals:   03/19/24 1401  BP: 124/77  Pulse: (!) 57    General Examination: The patient is a very pleasant 63 y.o. female in no acute distress. She appears well-developed and well-nourished and well groomed.   HEENT: Normocephalic, atraumatic, pupils are equal, round and reactive to light, extraocular tracking is good without limitation to gaze excursion or nystagmus noted. Hearing is grossly intact. Face is symmetric with normal facial animation. Speech is clear with no dysarthria noted. There is no hypophonia. There is no lip, neck/head, jaw or voice tremor. Neck is supple with full range  of passive and active motion. There are no carotid bruits on auscultation. Oropharynx exam reveals: mild mouth dryness, adequate dental hygiene with full dentures in place.  Marked airway crowding secondary to small airway entry, thicker soft palate, larger tongue, tonsils about 1+ bilaterally, prominent uvula, Mallampati class IV.  Neck circumference 16 three-quarter inches.  Tongue protrudes centrally and palate elevates symmetrically.   Chest: Clear to auscultation without wheezing, rhonchi or crackles noted.  Heart: S1+S2+0, regular and normal without murmurs, rubs or gallops noted.   Abdomen: Soft, non-tender and non-distended.  Extremities: There is trace pitting edema in both lower extremities and discoloration left distal lower extremity around the shin area, appears chronic and mild erythema and nodular swelling in the shin area of the right distal lower extremity in keeping with erythema nodosum.   Skin: Warm and dry without trophic changes noted.   Musculoskeletal: exam reveals no obvious joint deformities.   Neurologically:  Mental status: The patient is awake, alert and oriented in all 4 spheres. Her immediate and remote memory, attention, language skills and fund of knowledge are appropriate. There is no evidence of aphasia, agnosia, apraxia or anomia. Speech is clear with normal prosody and enunciation. Thought process is linear. Mood is normal and affect is normal.  Cranial nerves II - XII are as described above under HEENT exam.  Motor exam: Normal bulk, strength and tone is noted. There is no obvious action or resting tremor.  Fine motor skills and coordination: grossly intact.  Cerebellar testing: No dysmetria or intention tremor. There is no truncal or gait ataxia.  Sensory exam: intact to light touch in the upper and lower extremities.  Gait, station and balance: She stands easily. No veering to one side is noted. No leaning to one side is noted. Posture is age-appropriate  and stance is narrow based. Gait shows normal stride length and normal pace. No problems turning are noted.   Assessment and plan:  In summary, SHARNAE FRIEDRICH is a very pleasant 63 year old  female with an underlying medical history of hypertension, chronic venous insufficiency, allergies, gout, heart murmur, hypokalemia, vitamin B12 deficiency, vitamin D  deficiency, anemia, cardiomegaly, abnormal EKG, lupus, erythema nodosum, sickle cell trait, edema, and severe obesity with a BMI of over 40, whose history and physical exam are concerning for sleep disordered breathing, particularly obstructive sleep apnea (OSA). A laboratory attended sleep study is typically considered "gold standard" for evaluation of sleep disordered breathing.   I had a long chat with the patient about my findings and the diagnosis of sleep apnea, particularly OSA, its prognosis and treatment options. We talked about medical/conservative treatments, surgical interventions and non-pharmacological approaches for symptom control. I explained, in particular, the risks and ramifications of untreated moderate to severe OSA, especially with respect to developing cardiovascular disease down the road, including congestive heart failure (CHF), difficult to treat hypertension, cardiac arrhythmias (particularly A-fib), neurovascular complications including TIA, stroke and dementia. Even type 2 diabetes has, in part, been linked to untreated OSA. Symptoms of untreated OSA may include (but may not be limited to) daytime sleepiness, nocturia (i.e. frequent nighttime urination), memory problems, mood irritability and suboptimally controlled or worsening mood disorder such as depression and/or anxiety, lack of energy, lack of motivation, physical discomfort, as well as recurrent headaches, especially morning or nocturnal headaches. We talked about the importance of maintaining a healthy lifestyle and striving for healthy weight. In addition, we talked about  the importance of striving for and maintaining good sleep hygiene. I recommended a sleep study at this time. I outlined the differences between a laboratory attended sleep study which is considered more comprehensive and accurate over the option of a home sleep test (HST); the latter may lead to underestimation of sleep disordered breathing in some instances and does not help with diagnosing upper airway resistance syndrome and is not accurate enough to diagnose primary central sleep apnea typically. I outlined possible surgical and non-surgical treatment options of OSA, including the use of a positive airway pressure (PAP) device (i.e. CPAP, AutoPAP/APAP or BiPAP in certain circumstances), a custom-made dental device (aka oral appliance, which would require a referral to a specialist dentist or orthodontist typically, and is generally speaking not considered for patients with full dentures or edentulous state), upper airway surgical options, such as traditional UPPP (which is not considered a first-line treatment) or the Inspire device (hypoglossal nerve stimulator, which would involve a referral for consultation with an ENT surgeon, after careful selection, following inclusion criteria - also not first-line treatment). I explained the PAP treatment option to the patient in detail, as this is generally considered first-line treatment.  The patient indicated that she would be willing to try PAP therapy, if the need arises. I explained the importance of being compliant with PAP treatment, not only for insurance purposes but primarily to improve patient's symptoms symptoms, and for the patient's long term health benefit, including to reduce Her cardiovascular risks longer-term.    We will pick up our discussion about the next steps and treatment options after testing.  We will keep her posted as to the test results by phone call and/or MyChart messaging where possible.  We will plan to follow-up in sleep clinic  accordingly as well.  I answered all her questions today and the patient was in agreement.   I encouraged her to call with any interim questions, concerns, problems or updates or email us  through MyChart.  Generally speaking, sleep test authorizations may take up to 2 weeks, sometimes less, sometimes longer, the patient  is encouraged to get in touch with us  if they do not hear back from the sleep lab staff directly within the next 2 weeks.  Thank you very much for allowing me to participate in the care of this nice patient. If I can be of any further assistance to you please do not hesitate to call me at [phone].  Sincerely,   Candice Fairy, MD, PhD

## 2024-03-19 NOTE — Assessment & Plan Note (Signed)
 Continue valsartan  320 mg once daily Continue atenolol  100 mg once daily [did not tolerate carvedilol , suboptimal blood pressures and felt that this caused her erythema nodosum]. Start amlodipine  5 mg once daily as blood pressures remains uncontrolled at the office visit today. Target blood pressure below 130/80 mmHg.

## 2024-03-24 ENCOUNTER — Telehealth: Payer: Self-pay | Admitting: Neurology

## 2024-03-24 NOTE — Telephone Encounter (Signed)
 Spoke with the patient.  [employer] state no auth req   She is scheduled at [facility] for 05/11/24 at 8 pm.  Mailed packet, sent [employer] and email.

## 2024-03-30 ENCOUNTER — Inpatient Hospital Stay: Payer: BC Managed Care – PPO | Admitting: Medical Oncology

## 2024-03-30 ENCOUNTER — Inpatient Hospital Stay: Payer: BC Managed Care – PPO | Attending: Hematology & Oncology

## 2024-03-30 DIAGNOSIS — D563 Thalassemia minor: Secondary | ICD-10-CM | POA: Insufficient documentation

## 2024-03-30 DIAGNOSIS — E611 Iron deficiency: Secondary | ICD-10-CM | POA: Insufficient documentation

## 2024-03-30 DIAGNOSIS — Z79899 Other long term (current) drug therapy: Secondary | ICD-10-CM | POA: Insufficient documentation

## 2024-04-07 ENCOUNTER — Encounter: Payer: Self-pay | Admitting: Medical Oncology

## 2024-04-07 ENCOUNTER — Inpatient Hospital Stay (HOSPITAL_BASED_OUTPATIENT_CLINIC_OR_DEPARTMENT_OTHER): Payer: Self-pay | Admitting: Medical Oncology

## 2024-04-07 ENCOUNTER — Ambulatory Visit: Payer: Self-pay | Admitting: Medical Oncology

## 2024-04-07 ENCOUNTER — Inpatient Hospital Stay: Payer: Self-pay

## 2024-04-07 VITALS — BP 144/101 | HR 61 | Temp 98.2°F | Resp 19 | Ht 64.0 in | Wt 247.1 lb

## 2024-04-07 DIAGNOSIS — D563 Thalassemia minor: Secondary | ICD-10-CM

## 2024-04-07 DIAGNOSIS — D509 Iron deficiency anemia, unspecified: Secondary | ICD-10-CM | POA: Diagnosis not present

## 2024-04-07 DIAGNOSIS — Z79899 Other long term (current) drug therapy: Secondary | ICD-10-CM | POA: Diagnosis not present

## 2024-04-07 DIAGNOSIS — E611 Iron deficiency: Secondary | ICD-10-CM | POA: Diagnosis not present

## 2024-04-07 LAB — CMP (CANCER CENTER ONLY)
ALT: 9 U/L (ref 0–44)
AST: 12 U/L — ABNORMAL LOW (ref 15–41)
Albumin: 3.5 g/dL (ref 3.5–5.0)
Alkaline Phosphatase: 64 U/L (ref 38–126)
Anion gap: 6 (ref 5–15)
BUN: 19 mg/dL (ref 8–23)
CO2: 30 mmol/L (ref 22–32)
Calcium: 9.1 mg/dL (ref 8.9–10.3)
Chloride: 105 mmol/L (ref 98–111)
Creatinine: 0.98 mg/dL (ref 0.44–1.00)
GFR, Estimated: >60 mL/min (ref >60)
Glucose, Bld: 109 mg/dL — ABNORMAL HIGH (ref 70–99)
Potassium: 4.2 mmol/L (ref 3.5–5.1)
Sodium: 141 mmol/L (ref 135–145)
Total Bilirubin: 0.5 mg/dL (ref 0.0–1.2)
Total Protein: 6.8 g/dL (ref 6.5–8.1)

## 2024-04-07 LAB — CBC WITH DIFFERENTIAL (CANCER CENTER ONLY)
Abs Immature Granulocytes: 0.07 10*3/uL (ref 0.00–0.07)
Basophils Absolute: 0 10*3/uL (ref 0.0–0.1)
Basophils Relative: 0 %
Eosinophils Absolute: 0.1 10*3/uL (ref 0.0–0.5)
Eosinophils Relative: 1 %
HCT: 30.9 % — ABNORMAL LOW (ref 36.0–46.0)
Hemoglobin: 10.5 g/dL — ABNORMAL LOW (ref 12.0–15.0)
Immature Granulocytes: 1 %
Lymphocytes Relative: 34 %
Lymphs Abs: 2.5 10*3/uL (ref 0.7–4.0)
MCH: 28.8 pg (ref 26.0–34.0)
MCHC: 34 g/dL (ref 30.0–36.0)
MCV: 84.7 fL (ref 80.0–100.0)
Monocytes Absolute: 0.4 10*3/uL (ref 0.1–1.0)
Monocytes Relative: 5 %
Neutro Abs: 4.3 10*3/uL (ref 1.7–7.7)
Neutrophils Relative %: 59 %
Platelet Count: 197 10*3/uL (ref 150–400)
RBC: 3.65 MIL/uL — ABNORMAL LOW (ref 3.87–5.11)
RDW: 14.1 % (ref 11.5–15.5)
WBC Count: 7.4 10*3/uL (ref 4.0–10.5)
nRBC: 0 % (ref 0.0–0.2)

## 2024-04-07 LAB — RETIC PANEL
Immature Retic Fract: 14.5 % (ref 2.3–15.9)
RBC.: 3.68 MIL/uL — ABNORMAL LOW (ref 3.87–5.11)
Retic Count, Absolute: 50 10*3/uL (ref 19.0–186.0)
Retic Ct Pct: 1.4 % (ref 0.4–3.1)
Reticulocyte Hemoglobin: 30 pg (ref >27.9)

## 2024-04-07 LAB — IRON AND IRON BINDING CAPACITY (CC-WL,HP ONLY)
Iron: 62 ug/dL (ref 28–170)
Saturation Ratios: 27 % (ref 10.4–31.8)
TIBC: 228 ug/dL — ABNORMAL LOW (ref 250–450)
UIBC: 166 ug/dL (ref 148–442)

## 2024-04-07 NOTE — Progress Notes (Signed)
 Hematology and Oncology Follow Up Visit  DESHONDRA WORST [id number] [birth year] 63 y.o. 04/07/2024  Past Medical History:  Diagnosis Date   Abnormal EKG 10/08/2017   Sinus rhythm, LVH, interventricular conduction delay, with left anterior hemiblock.     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 12/02/2014.  Knox Perl, MD.  Normal global wall motion.  Diastolic dysfunction.  EF 67%.  Left atrial cavity is moderately dilated.  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  No significant valvular abnormality present.       Gout    great right toe   Heart murmur    Hypertension    Hypokalemia 10/03/2011   Joint pain    Left ventricular hypertrophy due to hypertensive disease 10/08/2017   Low basal metabolic rate 10/17/2023   Low vitamin B12 level 12/03/2023   Morbid obesity (HCC) 10/03/2011   Nocturia 10/17/2023   Skin lesion of left ear 03/16/2016   Skin lesion of right ear 03/16/2016   Sleep-disordered breathing 10/17/2023   Systolic murmur    Vitamin D  deficiency 11/12/2011    Principle Diagnosis:  Thalassemia Minor  Current Therapy:   Observation Folic Acid  encouraged  B12 once daily   PriorTherapy:   None   Interim History:  Ms. Meixner is back for follow-up for her Thalassemia Minor:   She has been on prednisone  for about 4+ weeks with goal of being on it for 7 weeks total. This is  prescribed by her rheumatologist for her joint pains and skin rash.   She reports that she is not taking folic acid  but is overall doing well. Only taking ibuprofen  PRN.   She continues to be seen by her rheumatologist. Previous renal biopsy was non-concerning per patient.   Overall she reports that she is doing well. No major bleeding or bruising episodes. Chronic fatigue is stable.   Appetite is up a bit  Wt Readings from Last 3 Encounters:  04/07/24 247 lb 1.3 oz (112.1 kg)  03/19/24 242 lb 3.2 oz (109.9 kg)  03/19/24 240 lb 12.8 oz (109.2 kg)     Medications:   Current Outpatient Medications:    atenolol  (TENORMIN ) 100 MG tablet, Take 100 mg by mouth daily., Disp: , Rfl:    azathioprine (IMURAN) 100 MG tablet, Take 100 mg by mouth daily., Disp: , Rfl:    colchicine  0.6 MG tablet, TAKE 1 TABLET BY MOUTH EVERY DAY, Disp: 90 tablet, Rfl: 3   Cyanocobalamin (B-12) 500 MCG TABS, Take once daily, Disp: 30 tablet, Rfl: 0   Emollient (EUCERIN) lotion, Apply 1 Application topically as needed for dry skin. Apply topically onto the skin as needed for dry skin., Disp: , Rfl:    fluticasone  (FLONASE ) 50 MCG/ACT nasal spray, Place 2 sprays into both nostrils daily., Disp: 16 g, Rfl: 2   folic acid  (FOLVITE ) 1 MG tablet, TAKE 2 TABLETS BY MOUTH  EVERY DAY, Disp: 60 tablet, Rfl: 2   levocetirizine (XYZAL ) 5 MG tablet, Take 1 tablet (5 mg total) by mouth every evening., Disp: 90 tablet, Rfl: 1   Multiple Vitamins-Minerals (PRESERVISION AREDS 2) CAPS, Take 2 capsules by mouth daily., Disp: , Rfl:    predniSONE  (DELTASONE ) 5 MG tablet, Take by mouth., Disp: , Rfl:    probenecid (BENEMID) 500 MG tablet, Take 500 mg by mouth 2 (two) times daily., Disp: , Rfl:    triamcinolone  ointment (KENALOG ) 0.1 %, Apply 1 application topically as needed., Disp: 30 g, Rfl: 1   valsartan  (DIOVAN ) 320 MG tablet, Take 1 tablet (320 mg total) by mouth daily., Disp: 90 tablet, Rfl: 3   Vitamin D , Ergocalciferol ,  (DRISDOL ) 1.25 MG (50000 UNIT) CAPS capsule, Take 1 capsule (50,000 Units total) by mouth every 7 (seven) days., Disp: 12 capsule, Rfl: 0   amLODipine  (NORVASC ) 5 MG tablet, Take 1 tablet (5 mg total) by mouth daily. (Patient not taking: Reported on 04/07/2024), Disp: 180 tablet, Rfl: 3   azaTHIOprine (IMURAN) 50 MG tablet, Take 150 mg by mouth daily. (Patient not taking: Reported on 04/07/2024), Disp: , Rfl:   Allergies:  Allergies  Allergen Reactions   Ace Inhibitors Nausea And Vomiting   Allopurinol  Nausea Only    Chills, headache, sore throat, sweats.     Past Medical History, Surgical history, Social history, and Family History were reviewed and updated.  Review of Systems: Review of Systems  Constitutional:  Positive for fatigue. Negative for unexpected weight change.  HENT:   Negative for nosebleeds.   Gastrointestinal:  Negative for blood in stool.  Genitourinary:  Negative for vaginal bleeding.   Musculoskeletal:  Negative for arthralgias.    Physical Exam:  height is 5\' 4"  (1.626 m) and weight is 247 lb 1.3 oz (112.1 kg). Her oral temperature is 98.2 F (36.8 C). Her blood pressure is 144/101 (abnormal) and her pulse is 61. Her respiration is 19 and oxygen saturation is 100%.   Physical Exam Vitals and nursing note reviewed.  Constitutional:      General: She is not in acute distress.    Appearance: Normal appearance. She is not ill-appearing or toxic-appearing.  Cardiovascular:     Rate and Rhythm: Normal rate and regular rhythm.     Heart sounds: Normal heart sounds.  Pulmonary:     Effort: Pulmonary effort is normal.     Breath sounds: Normal breath sounds.  Musculoskeletal:     Cervical back: Normal range of motion and neck supple.  Lymphadenopathy:     Cervical: No cervical adenopathy.  Skin:    General: Skin is warm.     Coloration: Skin is not pale.     Findings: No rash.  Neurological:     General: No focal deficit present.     Mental Status: She is  alert and oriented to person, place, and time.    Lab Results  Component Value Date   WBC 7.4 04/07/2024   HGB 10.5 (L) 04/07/2024   HCT 30.9 (L) 04/07/2024   MCV 84.7 04/07/2024   PLT 197 04/07/2024     Chemistry      Component Value Date/Time   NA 141 10/07/2023 1004   NA 145 (H) 10/23/2017 1554   K 3.6 10/07/2023 1004   CL 106 10/07/2023 1004   CO2 27 10/07/2023 1004   BUN 14 10/07/2023 1004   BUN 15 10/23/2017 1554   CREATININE 1.04 (H) 10/07/2023 1004   CREATININE  1.04 05/18/2016 0928      Component Value Date/Time   CALCIUM 9.4 10/07/2023 1004   ALKPHOS 100 10/07/2023 1004   AST 20 10/07/2023 1004   ALT 11 10/07/2023 1004   BILITOT 0.5 10/07/2023 1004      Impression and Plan: Encounter Diagnoses  Name Primary?   Thalassemia minor Yes   Iron deficiency anemia, unspecified iron deficiency anemia type      ARLINDA BARCELONA is a 63 y.o. female with a history of Thalassemia minor and iron deficiency.   CBC looks stable. Retic count is stable.  Iron studies pending No need for change of plan at this time.  Encouraged her to start her folic acid .   Disposition RTC 6 months APP, labs (CBC w/, CMP, retic, iron, ferritin, Hgb fractionation cascade)-[place]     I spent 25 minutes dedicated to the care of this patient (face to face and non-face to face) on the date of this encounter to include the items discussed above.   Sunnie England PA-C 5/27/20259:28 AM

## 2024-04-15 ENCOUNTER — Other Ambulatory Visit: Payer: Self-pay | Admitting: Family Medicine

## 2024-04-15 DIAGNOSIS — R7989 Other specified abnormal findings of blood chemistry: Secondary | ICD-10-CM

## 2024-04-15 DIAGNOSIS — J302 Other seasonal allergic rhinitis: Secondary | ICD-10-CM

## 2024-04-15 DIAGNOSIS — E559 Vitamin D deficiency, unspecified: Secondary | ICD-10-CM

## 2024-04-20 ENCOUNTER — Other Ambulatory Visit: Payer: Self-pay | Admitting: Family Medicine

## 2024-04-20 ENCOUNTER — Other Ambulatory Visit: Payer: Self-pay | Admitting: Hematology & Oncology

## 2024-04-21 MED ORDER — ATENOLOL 100 MG PO TABS: 100.0000 mg | ORAL_TABLET | Freq: Every day | ORAL | 3 refills | Status: AC

## 2024-05-11 ENCOUNTER — Ambulatory Visit (INDEPENDENT_AMBULATORY_CARE_PROVIDER_SITE_OTHER): Admitting: Neurology

## 2024-05-11 DIAGNOSIS — R0683 Snoring: Secondary | ICD-10-CM

## 2024-05-11 DIAGNOSIS — R351 Nocturia: Secondary | ICD-10-CM

## 2024-05-11 DIAGNOSIS — G4720 Circadian rhythm sleep disorder, unspecified type: Secondary | ICD-10-CM

## 2024-05-11 DIAGNOSIS — G4719 Other hypersomnia: Secondary | ICD-10-CM

## 2024-05-11 DIAGNOSIS — Z9189 Other specified personal risk factors, not elsewhere classified: Secondary | ICD-10-CM

## 2024-05-14 ENCOUNTER — Ambulatory Visit: Payer: Self-pay | Admitting: Neurology

## 2024-05-14 NOTE — Procedures (Signed)
 Physician Interpretation:     [facility] POLYSOMNOGRAPHY  INTERPRETATION REPORT   STUDY DATE:  05/11/2024     PATIENT NAME:  Candice [name]         DATE OF BIRTH:  1961-07-14  PATIENT ID:  [id number]    TYPE OF STUDY:  PSG  READING PHYSICIAN: TRUE MAR, MD, PhD   SCORING TECHNICIAN: Jesusa Haddock, RPSGT   Referred by: Dr. Darice Haddock ? History and Indication for Testing: 63 year old female with an underlying complex medical history of hypertension, chronic venous insufficiency, allergies, gout, heart murmur, hypokalemia, sickle cell trait, vitamin B12 deficiency, vitamin D  deficiency, anemia, cardiomegaly, abnormal EKG, lupus, erythema nodosum, edema, and severe obesity with a BMI of over 40, who reports snoring and excessive daytime somnolence as well as waking up with a sense of gasping for air. Her Epworth sleepiness is 16 out of 24, fatigue severity score is 50 out of 63.  Height: 64 in Weight: 242 lb (BMI 41) Neck Size: 17 in    MEDICATIONS: Tenormin , Imuran, Colchicine , Vitamin B 12, Eucerin, Flonase , Folvite , Xyzal , Multiple Vitamins-Minerals, Deltasone , Benemid, Kenalog , Diovan , Vitamin D    TECHNICAL DESCRIPTION: A registered sleep technologist was in attendance for the duration of the recording.  Data collection, scoring, video monitoring, and reporting were performed in compliance with the AASM Manual for the Scoring of Sleep and Associated Events; (Hypopnea is scored based on the criteria listed in Section VIII D. 1b in the AASM Manual V2.6 using a 4% oxygen desaturation rule or Hypopnea is scored based on the criteria listed in Section VIII D. 1a in the AASM Manual V2.6 using 3% oxygen desaturation and /or arousal rule).   SLEEP CONTINUITY AND SLEEP ARCHITECTURE:  Lights-out was at 22:05: and lights-on at  05:05:, with a total recording time of 6 hours, 59.5 min. Total sleep time ( TST) was 259.0 minutes with a decreased sleep efficiency at  61.7%. There was  12.9% REM sleep.  BODY POSITION:  TST was divided  between the following sleep positions: 9.8% supine;  90.2% lateral;  0% prone. Duration of total sleep and percent of total sleep in their respective position is as follows: supine 25 minutes (10%), non-supine 234 minutes (90%); right 160 minutes (62%), left 73 minutes (28%), and prone 00 minutes (0%).  Total supine REM sleep time was 00 minutes (0% of total REM sleep).  Sleep latency was increased at 80.0 minutes.  REM sleep latency was normal at 95.5 minutes. Of the total sleep time, the percentage of stage N1 sleep was 9.5%, which is increased, stage N2 sleep was 78%, which is increased, stage N3 sleep was absent, and REM sleep was 12.9%, which is reduced. Wake after sleep onset (WASO) time accounted for 80.5 minutes with mild to moderate sleep fragmentation noted.   RESPIRATORY MONITORING:   Based on CMS criteria (using a 4% oxygen desaturation rule for scoring hypopneas), there were 0 apneas (0 obstructive; 0 central; 0 mixed), and 8 hypopneas.  Apnea index was 0.0. Hypopnea index was 1.9. The apnea-hypopnea index was 1.9 overall (0.0 supine, 14 non-supine; 14.3 REM, 0.0 supine REM).  There were 0 respiratory effort-related arousals (RERAs).  The RERA index was 0 events/h. Total respiratory disturbance index (RDI) was 1.9 events/h. RDI results showed: supine RDI  0.0 /h; non-supine RDI 2.1 /h; REM RDI 14.3 /h, supine REM RDI 0.0 /h.   Based on AASM criteria (using a 3% oxygen desaturation and /or arousal rule for scoring hypopneas),  there were 0 apneas (0 obstructive; 0 central; 0 mixed), and 16 hypopneas. Apnea index was 0.0. Hypopnea index was 3.7. The apnea-hypopnea index was 3.7/hour overall (4.7 supine, 23 non-supine; 23.3 REM, 0.0 supine REM).  There were 0 respiratory effort-related arousals (RERAs).  The RERA index was 0 events/h. Total respiratory disturbance index (RDI) was 3.7 events/h. RDI results showed: supine RDI   4.7 /h; non-supine RDI 3.6 /h; REM RDI 23.3 /h, supine REM RDI 0.0 /h.    OXIMETRY: Oxyhemoglobin Saturation Nadir during sleep was at  89% from a mean of 99%.  Of the Total sleep time (TST)   hypoxemia (=<88%) was present for  0.3 minutes, or 0.1% of total sleep time.    LIMB MOVEMENTS: There were 0 periodic limb movements of sleep (0.0/hr), of which 0 (0.0/hr) were associated with an arousal.   AROUSAL: There were 46 arousals in total, for an arousal index of 11 arousals/hour.  Of these, 3 were identified as respiratory-related arousals (1 /h), 0 were PLM-related arousals (0 /h), and 51 were non-specific arousals (12 /h).    EEG: Review of the EEG showed no abnormal electrical discharges and symmetrical bihemispheric findings.      EKG: The EKG revealed normal sinus rhythm (NSR). The average heart rate during sleep was 47 bpm.    AUDIO/VIDEO REVIEW: The audio and video review did not show any abnormal or unusual behaviors, movements, phonations or vocalizations. The patient took no restroom breaks. Snoring was noted intermittently, in the mild to moderate range.    POST-STUDY QUESTIONNAIRE: Post study, the patient indicated, that sleep was the same as usual.     IMPRESSION:    1. Primary Snoring 2. Dysfunctions associated with sleep stages or arousal from sleep  RECOMMENDATIONS:    1. This study does not demonstrate any significant obstructive or central sleep disordered breathing with an AHI of less than 5/hour - her AHI was 3.7/hour - and oxygen saturations remained at or above 89% for the night. Mild to moderate snoring was noted. Treatment with a positive airway pressure device, such as CPAP or autoPAP is not indicated. Weight loss and avoidance of the supine sleep position may aid in reducing her snoring.  2. This study shows sleep fragmentation and abnormal sleep stage percentages; these are nonspecific findings and per se do not signify an intrinsic sleep disorder or a cause for  the patient's sleep-related symptoms. Causes include (but are not limited to) the first night effect of the sleep study, circadian rhythm disturbances, medication effect or an underlying mood disorder or medical problem.  3. The patient should be cautioned not to drive, work at heights, or operate dangerous or heavy equipment when tired or sleepy. Review and reiteration of good sleep hygiene measures should be pursued with any patient. 4. The patient will be advised to follow up with the referring provider, who will be notified of the test results.   I certify that I have reviewed the entire raw data recording prior to the issuance of this report in accordance with the Standards of Accreditation of the American Academy of Sleep Medicine (AASM).  True Mar, MD, PhD Medical Director, [facility]) Diplomat, ABPN (Neurology and Sleep)               Technical Report:   General Information  Name: Candice [name], Candice [name] BMI: 58.45 Physician: TRUE MAR, MD  ID: [id number] Height: 64.0 in Technician: Jesusa Haddock, RPSGT  Sex: Female Weight: 242.0 lb  Record: [id number]  Age: 47 [05/02/1961] Date: 05/11/2024    Medical & Medication History    63 year old female with an underlying complex medical history of hypertension, chronic venous insufficiency, allergies, gout, heart murmur, hypokalemia, sickle cell trait, vitamin B12 deficiency, vitamin D  deficiency, anemia, cardiomegaly, abnormal EKG, lupus, erythema nodosum, edema, and severe obesity with a BMI of over 40, who reports snoring and excessive daytime somnolence as well as waking up with a sense of gasping for air. Tenormin , Imuran, Colchicine , Vitamin B 12, Eucerin, Flonase , Folvite , Xyzal , Multiple Vitamins-Minerals, Deltasone , Benemid, Kenalog , Diovan , Vitamin D    Sleep Disorder      Comments   The patient came into the sleep lab for a PSG. Four restroom breaks. EKG is abnormal. Patient does have  a known cardiac history. Mild to moderate snoring. All sleep stages witnessed. Respiratory events scored with a 3% desat. Majority of respiratory events in REM. The patient slept supine and lateral. AHI was 3.1 after 2hrs of TST. Periods of WASO.     Lights out: 10:05:30 PM Lights on: 05:05:06 AM   Time Total Supine Side Prone Upright  Recording (TRT) 6h 59.41m 1h 30.32m 5h 29.42m 0h 0.4m 0h 0.76m  Sleep (TST) 4h 19.36m 0h 25.73m 3h 53.28m 0h 0.23m 0h 0.39m   Latency N1 N2 N3 REM Onset Per. Slp. Eff.  Actual 0h 0.52m 0h 11.21m 0h 0.43m 1h 35.29m 1h 20.14m 2h 11.34m 61.74%   Stg Dur Wake N1 N2 N3 REM  Total 160.5 24.5 201.0 0.0 33.5  Supine 64.5 3.5 22.0 0.0 0.0  Side 96.0 21.0 179.0 0.0 33.5  Prone 0.0 0.0 0.0 0.0 0.0  Upright 0.0 0.0 0.0 0.0 0.0   Stg % Wake N1 N2 N3 REM  Total 38.3 9.5 77.6 0.0 12.9  Supine 15.4 1.4 8.5 0.0 0.0  Side 22.9 8.1 69.1 0.0 12.9  Prone 0.0 0.0 0.0 0.0 0.0  Upright 0.0 0.0 0.0 0.0 0.0     Apnea Summary Sub Supine Side Prone Upright  Total 0 Total 0 0 0 0 0    REM 0 0 0 0 0    NREM 0 0 0 0 0  Obs 0 REM 0 0 0 0 0    NREM 0 0 0 0 0  Mix 0 REM 0 0 0 0 0    NREM 0 0 0 0 0  Cen 0 REM 0 0 0 0 0    NREM 0 0 0 0 0   Rera Summary Sub Supine Side Prone Upright  Total 0 Total 0 0 0 0 0    REM 0 0 0 0 0    NREM 0 0 0 0 0   Hypopnea Summary Sub Supine Side Prone Upright  Total 16 Total 16 2 14  0 0    REM 13 0 13 0 0    NREM 3 2 1  0 0   4% Hypopnea Summary Sub Supine Side Prone Upright  Total (4%) 8 Total 8 0 8 0 0    REM 8 0 8 0 0    NREM 0 0 0 0 0     AHI Total Obs Mix Cen  3.71 Apnea 0.00 0.00 0.00 0.00   Hypopnea 3.71 -- -- --  1.85 Hypopnea (4%) 1.85 -- -- --    Total Supine Side Prone Upright  Position AHI 3.71 4.71 3.60 0.00 0.00  REM AHI 23.28   NREM AHI 0.80   Position RDI 3.71 4.71 3.60 0.00 0.00  REM RDI 23.28   NREM RDI 0.80    4% Hypopnea Total Supine Side Prone Upright  Position AHI (4%) 1.85 0.00 2.06 0.00 0.00  REM AHI (4%) 14.33    NREM AHI (4%) 0.00   Position RDI (4%) 1.85 0.00 2.06 0.00 0.00  REM RDI (4%) 14.33   NREM RDI (4%) 0.00    Desaturation Information Threshold: 2% <100% <90% <80% <70% <60% <50% <40%  Supine 27.0 1.0 0.0 0.0 0.0 0.0 0.0  Side 85.0 2.0 0.0 0.0 0.0 0.0 0.0  Prone 0.0 0.0 0.0 0.0 0.0 0.0 0.0  Upright 0.0 0.0 0.0 0.0 0.0 0.0 0.0  Total 112.0 3.0 0.0 0.0 0.0 0.0 0.0  Index 19.8 0.5 0.0 0.0 0.0 0.0 0.0   Threshold: 3% <100% <90% <80% <70% <60% <50% <40%  Supine 14.0 1.0 0.0 0.0 0.0 0.0 0.0  Side 31.0 2.0 0.0 0.0 0.0 0.0 0.0  Prone 0.0 0.0 0.0 0.0 0.0 0.0 0.0  Upright 0.0 0.0 0.0 0.0 0.0 0.0 0.0  Total 45.0 3.0 0.0 0.0 0.0 0.0 0.0  Index 8.0 0.5 0.0 0.0 0.0 0.0 0.0   Threshold: 4% <100% <90% <80% <70% <60% <50% <40%  Supine 8.0 1.0 0.0 0.0 0.0 0.0 0.0  Side 16.0 2.0 0.0 0.0 0.0 0.0 0.0  Prone 0.0 0.0 0.0 0.0 0.0 0.0 0.0  Upright 0.0 0.0 0.0 0.0 0.0 0.0 0.0  Total 24.0 3.0 0.0 0.0 0.0 0.0 0.0  Index 4.2 0.5 0.0 0.0 0.0 0.0 0.0   Threshold: 3% <100% <90% <80% <70% <60% <50% <40%  Supine 14 1 0 0 0 0 0  Side 31 2 0 0 0 0 0  Prone 0 0 0 0 0 0 0  Upright 0 0 0 0 0 0 0  Total 45 3 0 0 0 0 0   Awakening/Arousal Information # of Awakenings 22  Wake after sleep onset 80.68m  Wake after persistent sleep 50.22m   Arousal Assoc. Arousals Index  Apneas 0 0.0  Hypopneas 3 0.7  Leg Movements 0 0.0  Snore 0 0.0  PTT Arousals 0 0.0  Spontaneous 51 11.8  Total 54 12.5  Leg Movement Information PLMS LMs Index  Total LMs during PLMS 0 0.0  LMs w/ Microarousals 0 0.0   LM LMs Index  w/ Microarousal 0 0.0  w/ Awakening 0 0.0  w/ Resp Event 0 0.0  Spontaneous 0 0.0  Total 0 0.0     Desaturation threshold setting: 3% Minimum desaturation setting: 10 seconds SaO2 nadir: 70% The longest event was a 70 sec obstructive Hypopnea with a minimum SaO2 of 96%. The lowest SaO2 was 89% associated with a 57 sec obstructive Hypopnea. EKG Rates EKG Avg Max Min  Awake 56 78 42  Asleep 47 64  40  EKG Events: N/A

## 2024-05-22 ENCOUNTER — Other Ambulatory Visit: Payer: Self-pay | Admitting: Family Medicine

## 2024-06-06 ENCOUNTER — Other Ambulatory Visit: Payer: Self-pay | Admitting: Hematology & Oncology

## 2024-06-11 ENCOUNTER — Other Ambulatory Visit: Payer: Self-pay | Admitting: Family Medicine

## 2024-06-17 ENCOUNTER — Ambulatory Visit: Payer: Self-pay

## 2024-06-17 NOTE — Telephone Encounter (Signed)
 FYI Only or Action Required?: Action required by provider: request for appointment.  Patient was last seen in primary care on 02/06/2024 by Waylan Darice BRAVO, DO.  Called Nurse Triage reporting [place].  Symptoms began several months ago.  Interventions attempted: Rest, hydration, or home remedies.  Symptoms are: unchanged.  Triage Disposition: See Physician Within 24 Hours  Patient/caregiver understands and will follow disposition?: YesCopied from CRM #[id number]. Topic: Clinical - Red Word Triage >> Jun 17, 2024  2:15 PM [place] E wrote: [facility] that prompted transfer to Nurse Triage: Painful nodules on both legs. Patient stated they are swollen, and very painful. Reason for Disposition  [1] Swelling is painful to touch AND [2] no fever  Answer Assessment - Initial Assessment Questions Hurts to walk. Had has tried OTC pain meds and ice. Cause unknown.     1. APPEARANCE of SWELLING: What does it look like?     Red and inflamed knots 2. SIZE: How large is the swelling? (e.g., inches, cm; or compare to size of pinhead, tip of pen, eraser, coin, pea, grape, ping pong ball)      Dime to nickel 3. LOCATION: Where is the swelling located?     Both legs 4. ONSET: When did the swelling start?     June 5. COLOR: What color is it? Is there more than one color?     red 6. PAIN: Is there any pain? If Yes, ask: How bad is the pain? (Scale 1-10; or mild, moderate, severe)       6 7. ITCH: Does it itch? If Yes, ask: How bad is the itch?      Yes as they heal 8. CAUSE: What do you think caused the swelling?     Not sure 9 OTHER SYMPTOMS: Do you have any other symptoms? (e.g., fever)     Warm to touch, swelling  Protocols used: Skin Lump or Localized Swelling-A-AH

## 2024-06-17 NOTE — Telephone Encounter (Signed)
 Appt scheduled

## 2024-06-18 ENCOUNTER — Encounter: Payer: Self-pay | Admitting: Physician Assistant

## 2024-06-18 ENCOUNTER — Ambulatory Visit: Payer: Self-pay | Admitting: Physician Assistant

## 2024-06-18 VITALS — BP 167/90 | HR 61 | Ht 64.0 in | Wt 257.0 lb

## 2024-06-18 DIAGNOSIS — E559 Vitamin D deficiency, unspecified: Secondary | ICD-10-CM

## 2024-06-18 DIAGNOSIS — L819 Disorder of pigmentation, unspecified: Secondary | ICD-10-CM | POA: Diagnosis not present

## 2024-06-18 DIAGNOSIS — E538 Deficiency of other specified B group vitamins: Secondary | ICD-10-CM

## 2024-06-18 DIAGNOSIS — M79604 Pain in right leg: Secondary | ICD-10-CM | POA: Diagnosis not present

## 2024-06-18 DIAGNOSIS — M79605 Pain in left leg: Secondary | ICD-10-CM | POA: Diagnosis not present

## 2024-06-18 DIAGNOSIS — R7989 Other specified abnormal findings of blood chemistry: Secondary | ICD-10-CM

## 2024-06-18 MED ORDER — GABAPENTIN 300 MG PO CAPS: 300.0000 mg | ORAL_CAPSULE | Freq: Two times a day (BID) | ORAL | 0 refills | Status: DC | PRN

## 2024-06-18 MED ORDER — VITAMIN D (ERGOCALCIFEROL) 1.25 MG (50000 UNIT) PO CAPS: 50000.0000 [IU] | ORAL_CAPSULE | ORAL | 0 refills | Status: DC

## 2024-06-18 MED ORDER — B-12 500 MCG PO TABS: ORAL_TABLET | ORAL | 3 refills | Status: DC

## 2024-06-18 NOTE — Progress Notes (Signed)
 Established patient visit   Patient: Candice [name]   DOB: 01/23/1961   63 y.o. Female  MRN: [id number] Visit Date: 06/18/2024  Today's healthcare provider: Manuelita Flatness, PA-C   Cc. Nodules on legs  Subjective     Discussed the use of AI scribe software for clinical note transcription with the patient, who gave verbal consent to proceed.  History of Present Illness   Candice [name] is a 63 year old female with lupus who presents with painful skin nodules on her legs.  She has experienced painful skin nodules on her legs since April. Initially treated for an infection, a biopsy later ruled out erythema nodosum, but no definitive diagnosis was given. The nodules appear as dark patches, initially as lumps, and are tender to touch. They have spread from one leg to both legs, with some areas remaining tender and others becoming flat over time. Pain is severe at night, affecting her ability to go to the bathroom. No numbness or tingling in her toes.  She previously took prednisone  for months, which alleviated symptoms but caused weight gain. She currently takes Tylenol  for pain relief but requests more prednisone  for relief today.  She is concerned about the lack of a definitive diagnosis and ongoing pain, which impacts her ability to work.       Medications: Outpatient Medications Prior to Visit  Medication Sig   amLODipine  (NORVASC ) 5 MG tablet Take 1 tablet (5 mg total) by mouth daily. (Patient not taking: Reported on 04/07/2024)   atenolol  (TENORMIN ) 100 MG tablet Take 1 tablet (100 mg total) by mouth daily.   azathioprine (IMURAN) 100 MG tablet Take 100 mg by mouth daily.   azaTHIOprine (IMURAN) 50 MG tablet Take 150 mg by mouth daily. (Patient not taking: Reported on 04/07/2024)   colchicine  0.6 MG tablet TAKE 1 TABLET BY MOUTH EVERY DAY   Emollient (EUCERIN) lotion Apply 1 Application topically as needed for dry skin. Apply topically onto the skin as needed for dry skin.    fluticasone  (FLONASE ) 50 MCG/ACT nasal spray Place 2 sprays into both nostrils daily.   folic acid  (FOLVITE ) 1 MG tablet TAKE 2 TABLETS BY MOUTH EVERY DAY   levocetirizine (XYZAL ) 5 MG tablet Take 1 tablet (5 mg total) by mouth every evening.   Multiple Vitamins-Minerals (PRESERVISION AREDS 2) CAPS Take 2 capsules by mouth daily.   predniSONE  (DELTASONE ) 5 MG tablet Take by mouth.   probenecid (BENEMID) 500 MG tablet Take 500 mg by mouth 2 (two) times daily.   triamcinolone  ointment (KENALOG ) 0.1 % Apply 1 application topically as needed.   valsartan  (DIOVAN ) 320 MG tablet Take 1 tablet (320 mg total) by mouth daily.   [DISCONTINUED] Cyanocobalamin (B-12) 500 MCG TABS Take once daily   [DISCONTINUED] Vitamin D , Ergocalciferol , (DRISDOL ) 1.25 MG (50000 UNIT) CAPS capsule Take 1 capsule (50,000 Units total) by mouth every 7 (seven) days.   No facility-administered medications prior to visit.    Review of Systems  Constitutional:  Negative for fatigue and fever.  Respiratory:  Negative for cough and shortness of breath.   Cardiovascular:  Negative for chest pain and leg swelling.  Gastrointestinal:  Negative for abdominal pain.  Neurological:  Negative for dizziness and headaches.       Objective    BP (!) 167/90   Pulse 61   Ht 5' 4 (1.626 m)   Wt 257 lb (116.6 kg)   BMI 44.11 kg/m    Physical Exam  Vitals reviewed.  Constitutional:      Appearance: She is not ill-appearing.  HENT:     Head: Normocephalic.  Eyes:     Conjunctiva/sclera: Conjunctivae normal.  Cardiovascular:     Rate and Rhythm: Normal rate.  Pulmonary:     Effort: Pulmonary effort is normal. No respiratory distress.  Skin:    Comments: B/l LE with dry, hyperpigmented patches with either firmness or discrete nodules underneath. Very tender to touch. Large hyperpigmentation patch LLE pt states is scarring from stasis dermatitis years ago.   No LE edema  Neurological:     Mental Status: She is alert and  oriented to person, place, and time.  Psychiatric:        Mood and Affect: Mood normal.        Behavior: Behavior normal.      No results found for any visits on 06/18/24.  Assessment & Plan    Bilateral leg pain Hyperpigmentation Unclear etiology as per pt bx came back inconclusive. Encourage pt to f/b with dermatology . Pt requesting steroids, advised not an appropriate tx for this condition and would need to be eval by her rheumatologist.   Offerred trial of gabapentin  at bedtime up to BID for pain relief.  -     Gabapentin ; Take 1 capsule (300 mg total) by mouth 2 (two) times daily as needed.  Dispense: 60 capsule; Refill: 0   Low vitamin B12 level -     B-12; Take once daily  Dispense: 30 tablet; Refill: 3  Vitamin D  deficiency -     Vitamin D  (Ergocalciferol ); Take 1 capsule (50,000 Units total) by mouth every 7 (seven) days.  Dispense: 12 capsule; Refill: 0  Folate deficiency Pt requesting refill of vitamin, but last level normal. Recommending we recheck before refilling -     Folate    Return if symptoms worsen or fail to improve.       Manuelita Flatness, PA-C  [facility] at [facility] [place] [phone] (phone) [phone] (fax)  [facility]

## 2024-06-19 ENCOUNTER — Ambulatory Visit: Payer: Self-pay | Admitting: Physician Assistant

## 2024-06-19 LAB — FOLATE: Folate: 20.8 ng/mL (ref >5.9)

## 2024-06-24 ENCOUNTER — Ambulatory Visit (INDEPENDENT_AMBULATORY_CARE_PROVIDER_SITE_OTHER): Admitting: Family Medicine

## 2024-06-24 ENCOUNTER — Encounter: Payer: Self-pay | Admitting: Family Medicine

## 2024-06-24 VITALS — BP 146/92 | HR 51 | Ht 64.0 in | Wt 256.4 lb

## 2024-06-24 DIAGNOSIS — I10 Essential (primary) hypertension: Secondary | ICD-10-CM

## 2024-06-24 DIAGNOSIS — M79605 Pain in left leg: Secondary | ICD-10-CM | POA: Diagnosis not present

## 2024-06-24 DIAGNOSIS — M79604 Pain in right leg: Secondary | ICD-10-CM | POA: Diagnosis not present

## 2024-06-24 MED ORDER — PREDNISONE 5 MG PO TABS: 5.0000 mg | ORAL_TABLET | Freq: Every day | ORAL | 0 refills | Status: DC

## 2024-06-24 MED ORDER — AMLODIPINE BESYLATE 10 MG PO TABS: 10.0000 mg | ORAL_TABLET | Freq: Every day | ORAL | 1 refills | Status: DC

## 2024-06-24 NOTE — Progress Notes (Signed)
 No chief complaint on file.   Subjective: Patient is a 63 y.o. female here for f/u.  Patient has a chronic history of skin lesions on her lower extremities.  They are responsive to steroids.  She is working with the dermatology team to find the correct diagnosis.  As little as 5 mg of prednisone  seems to help.  She is requesting some relief as it does hurt to walk with this.  No recent injury or change in activity.  No fevers.  She has a history of high blood pressure.  She is compliant with her medication.  She is taking amlodipine  5 mg daily, valsartan  320 mg daily, atenolol  100 mg daily.  No adverse effects.  Blood pressures ranging in the 140s-160s over 90s-100s.  No chest pain or shortness of breath.  Past Medical History:  Diagnosis Date   Abnormal EKG 10/08/2017   Sinus rhythm, LVH, interventricular conduction delay, with left anterior hemiblock.     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 12/02/2014.  Gordy Bergamo, MD.  Normal global wall motion.  Diastolic dysfunction.  EF 67%.  Left atrial cavity is moderately dilated.  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  No significant valvular abnormality present.       Gout    great right toe   Heart murmur    Hypertension    Hypokalemia 10/03/2011   Joint pain    Left ventricular hypertrophy due to hypertensive disease 10/08/2017   Low basal  metabolic rate 10/17/2023   Low vitamin B12 level 12/03/2023   Morbid obesity (HCC) 10/03/2011   Nocturia 10/17/2023   Skin lesion of left ear 03/16/2016   Skin lesion of right ear 03/16/2016   Sleep-disordered breathing 10/17/2023   Systolic murmur    Vitamin D  deficiency 11/12/2011    Objective: BP (!) 146/92 (BP Location: Left Arm, Cuff Size: Large)   Pulse (!) 51   Ht 5' 4 (1.626 m)   Wt 256 lb 6.4 oz (116.3 kg)   BMI 44.01 kg/m  General: Awake, appears stated age Heart: Regular rhythm, bradycardic, 1+ pitting bilateral LE edema tapering at the knees Lungs: No accessory muscle use Skin: Erythematous patches on the anterior legs bilaterally with TTP and blanching. Psych: Age appropriate judgment and insight, normal affect and mood  Assessment and Plan: Pain in both lower extremities  Essential hypertension  Chronic issue, not controlled.  Start prednisone  5 mg daily until she can get in with the dermatology team.  When she schedules with her new physician, she will ensure they know she is on this daily for the time being.  We discussed the pros and cons of this.  Historically she does not like being on prednisone  but is willing to suffer the side effects given the misery she is in. Chronic, not controlled.  Continue valsartan  320 mg daily, atenolol  100 mg daily.  Increase  amlodipine  from 5 mg daily to 10 mg daily.  Follow-up with cardiology as originally scheduled.  Counseled on diet and exercise.  Please continue to monitor blood pressure at home. The patient voiced understanding and agreement to the plan.  Mabel [place], DO 06/24/24  4:58 PM

## 2024-06-24 NOTE — Patient Instructions (Addendum)
 You may need to hold the prednisone  before your dermatology appt.   Keep the diet clean and stay active.  Check your blood pressures 2-3 times per week, alternating the time of day you check it. If it is high, considering waiting 1-2 minutes and rechecking. If it gets higher, your anxiety is likely creeping up and we should avoid rechecking.   Let us  know if you need anything.

## 2024-08-12 DIAGNOSIS — M3500 Sicca syndrome, unspecified: Secondary | ICD-10-CM | POA: Diagnosis not present

## 2024-08-12 DIAGNOSIS — M109 Gout, unspecified: Secondary | ICD-10-CM | POA: Diagnosis not present

## 2024-08-12 DIAGNOSIS — Z79899 Other long term (current) drug therapy: Secondary | ICD-10-CM | POA: Diagnosis not present

## 2024-08-16 ENCOUNTER — Other Ambulatory Visit: Payer: Self-pay | Admitting: Family Medicine

## 2024-08-16 DIAGNOSIS — J302 Other seasonal allergic rhinitis: Secondary | ICD-10-CM

## 2024-10-12 ENCOUNTER — Inpatient Hospital Stay: Payer: Self-pay

## 2024-10-12 ENCOUNTER — Ambulatory Visit: Admitting: Medical Oncology

## 2024-10-14 ENCOUNTER — Encounter: Payer: Self-pay | Admitting: Medical Oncology

## 2024-10-14 ENCOUNTER — Inpatient Hospital Stay: Attending: Medical Oncology

## 2024-10-14 ENCOUNTER — Inpatient Hospital Stay: Admitting: Medical Oncology

## 2024-10-14 VITALS — BP 149/88 | HR 58 | Temp 98.2°F | Resp 19 | Ht 64.0 in | Wt 255.1 lb

## 2024-10-14 DIAGNOSIS — D509 Iron deficiency anemia, unspecified: Secondary | ICD-10-CM

## 2024-10-14 DIAGNOSIS — R7989 Other specified abnormal findings of blood chemistry: Secondary | ICD-10-CM | POA: Diagnosis not present

## 2024-10-14 DIAGNOSIS — D563 Thalassemia minor: Secondary | ICD-10-CM | POA: Diagnosis not present

## 2024-10-14 DIAGNOSIS — E538 Deficiency of other specified B group vitamins: Secondary | ICD-10-CM | POA: Diagnosis not present

## 2024-10-14 LAB — CBC WITH DIFFERENTIAL (CANCER CENTER ONLY)
Abs Immature Granulocytes: 0.01 K/uL (ref 0.00–0.07)
Basophils Absolute: 0 K/uL (ref 0.0–0.1)
Basophils Relative: 1 %
Eosinophils Absolute: 0.1 K/uL (ref 0.0–0.5)
Eosinophils Relative: 1 %
HCT: 31.1 % — ABNORMAL LOW (ref 36.0–46.0)
Hemoglobin: 10.8 g/dL — ABNORMAL LOW (ref 12.0–15.0)
Immature Granulocytes: 0 %
Lymphocytes Relative: 45 %
Lymphs Abs: 1.9 K/uL (ref 0.7–4.0)
MCH: 28.6 pg (ref 26.0–34.0)
MCHC: 34.7 g/dL (ref 30.0–36.0)
MCV: 82.3 fL (ref 80.0–100.0)
Monocytes Absolute: 0.4 K/uL (ref 0.1–1.0)
Monocytes Relative: 11 %
Neutro Abs: 1.7 K/uL (ref 1.7–7.7)
Neutrophils Relative %: 42 %
Platelet Count: 212 K/uL (ref 150–400)
RBC: 3.78 MIL/uL — ABNORMAL LOW (ref 3.87–5.11)
RDW: 13.3 % (ref 11.5–15.5)
WBC Count: 4.2 K/uL (ref 4.0–10.5)
nRBC: 0 % (ref 0.0–0.2)

## 2024-10-14 LAB — CMP (CANCER CENTER ONLY)
ALT: 12 U/L (ref 0–44)
AST: 25 U/L (ref 15–41)
Albumin: 3.7 g/dL (ref 3.5–5.0)
Alkaline Phosphatase: 123 U/L (ref 38–126)
Anion gap: 10 (ref 5–15)
BUN: 13 mg/dL (ref 8–23)
CO2: 25 mmol/L (ref 22–32)
Calcium: 9.1 mg/dL (ref 8.9–10.3)
Chloride: 106 mmol/L (ref 98–111)
Creatinine: 0.95 mg/dL (ref 0.44–1.00)
GFR, Estimated: >60 mL/min (ref >60)
Glucose, Bld: 101 mg/dL — ABNORMAL HIGH (ref 70–99)
Potassium: 3.5 mmol/L (ref 3.5–5.1)
Sodium: 141 mmol/L (ref 135–145)
Total Bilirubin: 0.5 mg/dL (ref 0.0–1.2)
Total Protein: 7.6 g/dL (ref 6.5–8.1)

## 2024-10-14 LAB — IRON AND IRON BINDING CAPACITY (CC-WL,HP ONLY)
Iron: 63 ug/dL (ref 28–170)
Saturation Ratios: 26 % (ref 10.4–31.8)
TIBC: 244 ug/dL — ABNORMAL LOW (ref 250–450)
UIBC: 181 ug/dL

## 2024-10-14 LAB — RETIC PANEL
Immature Retic Fract: 19 % — ABNORMAL HIGH (ref 2.3–15.9)
RBC.: 3.8 MIL/uL — ABNORMAL LOW (ref 3.87–5.11)
Retic Count, Absolute: 58.9 K/uL (ref 19.0–186.0)
Retic Ct Pct: 1.6 % (ref 0.4–3.1)
Reticulocyte Hemoglobin: 30.7 pg (ref >27.9)

## 2024-10-14 MED ORDER — B-12 500 MCG PO TABS: ORAL_TABLET | ORAL | 3 refills | Status: AC

## 2024-10-14 MED ORDER — FOLIC ACID 1 MG PO TABS: 1.0000 mg | ORAL_TABLET | Freq: Every day | ORAL | 3 refills | Status: AC

## 2024-10-14 NOTE — Progress Notes (Signed)
 Hematology and Oncology Follow Up Visit  Candice [name] [id number] Oct 16, 1961 63 y.o. 10/14/2024  Past Medical History:  Diagnosis Date   Abnormal EKG 10/08/2017   Sinus rhythm, LVH, interventricular conduction delay, with left anterior hemiblock.     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 12/02/2014.  Gordy Bergamo, MD.  Normal global wall motion.  Diastolic dysfunction.  EF 67%.  Left atrial cavity is moderately dilated.  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  No significant valvular abnormality present.       Gout    great right toe   Heart murmur    Hypertension    Hypokalemia 10/03/2011   Joint pain    Left ventricular hypertrophy due to hypertensive disease 10/08/2017   Low basal metabolic rate 10/17/2023   Low vitamin B12 level 12/03/2023   Morbid obesity (HCC) 10/03/2011   Nocturia 10/17/2023   Skin lesion of left ear 03/16/2016   Skin lesion of right ear 03/16/2016   Sleep-disordered breathing 10/17/2023   Systolic murmur    Vitamin D  deficiency 11/12/2011    Principle Diagnosis:  Thalassemia Minor  Current Therapy:   Observation Folic Acid  encouraged  B12 once daily   PriorTherapy:   None    Interim History:  [name] is back for follow-up for her Thalassemia Minor:   She continues to be followed by her rheumatologist for her joint pains and skin rash. She  has a diagnosis of Discoid lupus.   She has ran out of her folic acid  and asks for a refill. She is not currently taking B12 supplement as she ran out of this as well.   Overall she reports that she is doing well.   There has been no bleeding to her knowledge: denies epistaxis, gingivitis, hemoptysis, hematemesis, hematuria, melena, excessive bruising, blood donation.   Chronic fatigue is stable.   Appetite is up a bit  Wt Readings from Last 3 Encounters:  10/14/24 255 lb 1.9 oz (115.7 kg)  06/24/24 256 lb 6.4 oz (116.3 kg)  06/18/24 257 lb (116.6 kg)     Medications:   Current Outpatient Medications:    amLODipine  (NORVASC ) 10 MG tablet, Take 1 tablet (10 mg total) by mouth daily., Disp: 90 tablet, Rfl: 1   atenolol  (TENORMIN ) 100 MG tablet, Take 1 tablet (100 mg total) by mouth daily., Disp: 90 tablet, Rfl: 3   colchicine  0.6 MG tablet, TAKE 1 TABLET BY MOUTH EVERY DAY, Disp: 90 tablet, Rfl: 3   Emollient (EUCERIN) lotion, Apply 1 Application topically as needed for dry skin. Apply topically onto the skin as needed for dry skin., Disp: , Rfl:    fluticasone  (FLONASE ) 50 MCG/ACT nasal spray, Place 2 sprays into both nostrils daily., Disp: 16 g, Rfl: 2   folic acid  (FOLVITE ) 1 MG tablet, Take 1 tablet (1 mg total) by mouth daily., Disp: 90 tablet,  Rfl: 3   levocetirizine (XYZAL ) 5 MG tablet, TAKE 1 TABLET BY MOUTH EVERY DAY IN THE EVENING, Disp: 90 tablet, Rfl: 0   Multiple Vitamins-Minerals (PRESERVISION AREDS 2) CAPS, Take 2 capsules by mouth daily., Disp: , Rfl:    mycophenolate (CELLCEPT) 250 MG capsule, Take 250 mg by mouth 3 (three) times daily., Disp: , Rfl:    probenecid (BENEMID) 500 MG tablet, Take 500 mg by mouth 2 (two) times daily., Disp: , Rfl:    triamcinolone  ointment (KENALOG ) 0.1 %, Apply 1 application topically as needed., Disp: 30 g, Rfl: 1   valsartan  (DIOVAN ) 320 MG tablet, Take 1 tablet (320 mg total) by mouth daily., Disp: 90 tablet, Rfl: 3   Cyanocobalamin  (B-12) 500 MCG TABS, Take once daily, Disp: 90 tablet, Rfl: 3   predniSONE  (DELTASONE ) 5 MG tablet, Take 1 tablet (5 mg total) by mouth daily with breakfast. (Patient not taking: Reported on 10/14/2024), Disp: 30 tablet, Rfl: 0  Allergies:  Allergies  Allergen Reactions   Ace Inhibitors Nausea And Vomiting   Allopurinol  Nausea Only    Chills, headache, sore throat, sweats.     Past Medical History, Surgical history, Social history, and Family History were reviewed and updated.  Review of Systems: Review of Systems  Constitutional:  Positive for fatigue. Negative for unexpected weight change.  HENT:   Negative for nosebleeds.   Gastrointestinal:  Negative for blood in stool.  Genitourinary:  Negative for vaginal bleeding.   Musculoskeletal:  Negative for arthralgias.    Physical Exam:  height is 5' 4 (1.626 m) and weight is 255 lb 1.9 oz (115.7 kg). Her oral temperature is 98.2 F (36.8 C). Her blood pressure is 149/88 (abnormal) and her pulse is 58 (abnormal). Her respiration is 19 and oxygen saturation is 99%.   Physical Exam Vitals and nursing note reviewed.  Constitutional:      General: She is not in acute distress.    Appearance: Normal appearance. She is not ill-appearing or toxic-appearing.  Cardiovascular:     Rate and Rhythm: Normal rate and regular rhythm.     Heart sounds: Normal heart sounds.  Pulmonary:     Effort: Pulmonary effort is normal.     Breath sounds: Normal breath sounds.  Musculoskeletal:     Cervical back: Normal range of motion and neck supple.  Lymphadenopathy:     Cervical: No cervical adenopathy.  Skin:    General: Skin is warm.     Coloration: Skin is not pale.     Findings: No rash.  Neurological:     General: No focal deficit present.     Mental Status: She is alert and oriented to person, place, and time.    Lab Results  Component Value Date   WBC 4.2 10/14/2024   HGB 10.8 (L) 10/14/2024   HCT 31.1 (L) 10/14/2024   MCV 82.3  10/14/2024   PLT 212 10/14/2024     Chemistry      Component Value Date/Time   NA 141 04/07/2024 0857   NA 145 (H) 10/23/2017 1554   K 4.2 04/07/2024 0857   CL 105 04/07/2024 0857   CO2 30 04/07/2024 0857   BUN 19 04/07/2024 0857   BUN 15 10/23/2017 1554   CREATININE 0.98 04/07/2024 0857   CREATININE 1.04 05/18/2016 0928      Component Value Date/Time   CALCIUM 9.1 04/07/2024 0857   ALKPHOS 64 04/07/2024 0857   AST 12 (L) 04/07/2024 0857   ALT 9  04/07/2024 0857   BILITOT 0.5 04/07/2024 9142     Encounter Diagnoses  Name Primary?   Low vitamin B12 level    Thalassemia minor Yes   Iron deficiency anemia, unspecified iron deficiency anemia type     Impression and Plan: ZERINA HALLINAN is a 63 y.o. female with a history of Thalassemia minor and B12 and iron deficiency.   CBC is stable Iron studies pending No need for change of plan at this time.  Encouraged her to restart her B12 and folic acid  supplements which I have refilled to the pharmacy.   Disposition RTC 12 months APP, labs (CBC w/, CMP, retic, iron, ferritin, Hgb fractionation cascade)-[place]     I spent 25 minutes dedicated to the care of this patient (face to face and non-face to face) on the date of this encounter to include the items discussed above.   Lauraine Dais PA-C 12/3/20259:39 AM

## 2024-10-19 ENCOUNTER — Ambulatory Visit: Payer: Self-pay | Admitting: Medical Oncology

## 2024-10-19 LAB — HGB FRACTIONATION CASCADE

## 2024-10-19 LAB — HGB FRACTIONATION BY HPLC
Hgb A2: 3.7 % — ABNORMAL HIGH (ref 1.8–3.2)
Hgb A: 56.9 % — ABNORMAL LOW (ref 96.4–98.8)
Hgb C: 39.4 % — ABNORMAL HIGH
Hgb E: 0 %
Hgb F: 0 % (ref 0.0–2.0)
Hgb S: 0 %
Hgb Variant: 0 %

## 2024-11-21 ENCOUNTER — Other Ambulatory Visit: Payer: Self-pay | Admitting: Family Medicine

## 2024-11-21 DIAGNOSIS — J302 Other seasonal allergic rhinitis: Secondary | ICD-10-CM

## 2024-11-24 ENCOUNTER — Encounter: Payer: Self-pay | Admitting: Family Medicine

## 2024-11-24 ENCOUNTER — Ambulatory Visit: Admitting: Family Medicine

## 2024-11-24 VITALS — BP 132/84 | HR 69 | Temp 98.0°F | Resp 16 | Ht 64.0 in | Wt 255.0 lb

## 2024-11-24 DIAGNOSIS — M542 Cervicalgia: Secondary | ICD-10-CM | POA: Diagnosis not present

## 2024-11-24 DIAGNOSIS — R29810 Facial weakness: Secondary | ICD-10-CM | POA: Diagnosis not present

## 2024-11-24 DIAGNOSIS — S46819A Strain of other muscles, fascia and tendons at shoulder and upper arm level, unspecified arm, initial encounter: Secondary | ICD-10-CM | POA: Diagnosis not present

## 2024-11-24 MED ORDER — CYCLOBENZAPRINE HCL 10 MG PO TABS: 5.0000 mg | ORAL_TABLET | Freq: Three times a day (TID) | ORAL | 0 refills | Status: AC | PRN

## 2024-11-24 NOTE — Progress Notes (Signed)
 Musculoskeletal Exam  Patient: Candice [name] DOB: [birth year]  DOS: 11/24/2024  SUBJECTIVE:  Chief Complaint:   Chief Complaint  Patient presents with   Muscle Pain    Muscle Pain    Candice [name] is a 64 y.o.  female for evaluation and treatment of neck/shoulder pain.   Onset:  7 weeks ago. No inj or change in activity.  Location: neck, trap region Character:  aching and burning  Progression of issue:  is unchanged Associated symptoms: stretching, radiating down RUE No bruising, redness, swelling Treatment: to date has been OTC NSAIDS and home exercises.   Neurovascular symptoms: no  11 months ago, the patient was ill.  She had a right-sided facial droop for several days which resolved.  She also feels her right shoulder drop down.  She is right-handed.  She has no lingering effects from there.  She will otherwise have intermittent balance issues.  No difficulty swallowing, trouble with speech, vision changes, headaches, continued facial droop, weakness.  Past Medical History:  Diagnosis Date   Abnormal EKG 10/08/2017   Sinus rhythm, LVH, interventricular conduction delay, with left anterior hemiblock.     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 12/02/2014.  Gordy Bergamo, MD.  Normal global wall motion.  Diastolic dysfunction.  EF 67%.  Left atrial cavity is moderately dilated.  A  patent forament ovale is probably present.  No significant valvular abnormality present.       Gout    great right toe   Heart murmur    Hypertension    Hypokalemia 10/03/2011   Joint pain    Left ventricular hypertrophy due to hypertensive disease 10/08/2017   Low basal metabolic rate 10/17/2023   Low vitamin B12 level 12/03/2023   Morbid obesity (HCC) 10/03/2011   Nocturia 10/17/2023   Skin lesion of left ear 03/16/2016   Skin lesion of right ear 03/16/2016   Sleep-disordered breathing 10/17/2023   Systolic murmur    Vitamin D  deficiency 11/12/2011    Objective: VITAL SIGNS: BP 132/84 (BP Location: Left Arm, Patient Position: Sitting)   Pulse 69   Temp 98 F (36.7 C) (Oral)   Resp 16   Ht 5' 4 (1.626 m)   Wt 255 lb (115.7 kg)   SpO2 95%   BMI 43.77 kg/m  Constitutional: Well formed, well developed. No acute distress. Thorax & Lungs: No accessory muscle use Musculoskeletal: neck.   Normal active range of motion: yes.   Normal passive range of motion: yes Tenderness to palpation: Yes over the lateral neck musculature bilaterally worse on the right Hypertonicity over both trapezius musculature worse on the right Deformity: no Ecchymosis: no Tests positive: None Tests negative: Spurling's Neurologic: Normal sensory function. No focal deficits noted. DTR's equal and symmetric throughout. No clonus.  5/5 strength throughout. Psychiatric: Normal mood. Age appropriate judgment and insight. Alert & oriented x 3.  Assessment:  Facial droop - Plan: MR Brain Wo Contrast  Neck pain  Strain of trapezius muscle, unspecified laterality, initial encounter  Plan: Could have been Bell's palsy stemming from an infection.  Given history, will check MRI to rule out prior stroke.  Would add statin and aspirin if that were the case.   Stretches/exercises, heat, ice, Tylenol .  As above.  Would consider physical therapy if no improvement.  If neurologic signs arise, would consider  initially x-ray, referral to orthospine, and possibly an MRI. F/u as originally scheduled. The patient voiced understanding and agreement to the plan.  I spent 30 minutes with the patient discussing the above plans in addition to reviewing her chart on the same day of the visit.   Mabel [place], DO 11/24/2024  9:49 AM

## 2024-11-24 NOTE — Patient Instructions (Signed)
 Someone will reach out to schedule your MRI.  Heat (pad or rice pillow in microwave) over affected area, 10-15 minutes twice daily.   Ice/cold pack over area for 10-15 min twice daily.  OK to take Tylenol  1000 mg (2 extra strength tabs) or 975 mg (3 regular strength tabs) every 6 hours as needed.  Let us  know if you need anything.  EXERCISES RANGE OF MOTION (ROM) AND STRETCHING EXERCISES  These exercises may help you when beginning to rehabilitate your issue. In order to successfully resolve your symptoms, you must improve your posture. These exercises are designed to help reduce the forward-head and rounded-shoulder posture which contributes to this condition. Your symptoms may resolve with or without further involvement from your physician, physical therapist or athletic trainer. While completing these exercises, remember:  Restoring tissue flexibility helps normal motion to return to the joints. This allows healthier, less painful movement and activity. An effective stretch should be held for at least 20 seconds, although you may need to begin with shorter hold times for comfort. A stretch should never be painful. You should only feel a gentle lengthening or release in the stretched tissue. Do not do any stretch or exercise that you cannot tolerate.  STRETCH- Axial Extensors Lie on your back on the floor. You may bend your knees for comfort. Place a rolled-up hand towel or dish towel, about 2 inches in diameter, under the part of your head that makes contact with the floor. Gently tuck your chin, as if trying to make a double chin, until you feel a gentle stretch at the base of your head. Hold 15-20 seconds. Repeat 2-3 times. Complete this exercise 1 time per day.   STRETCH - Axial Extension  Stand or sit on a firm surface. Assume a good posture: chest up, shoulders drawn back, abdominal muscles slightly tense, knees unlocked (if standing) and feet hip width apart. Slowly retract your  chin so your head slides back and your chin slightly lowers. Continue to look straight ahead. You should feel a gentle stretch in the back of your head. Be certain not to feel an aggressive stretch since this can cause headaches later. Hold for 15-20 seconds. Repeat 2-3 times. Complete this exercise 1 time per day.  STRETCH - Cervical Side Bend  Stand or sit on a firm surface. Assume a good posture: chest up, shoulders drawn back, abdominal muscles slightly tense, knees unlocked (if standing) and feet hip width apart. Without letting your nose or shoulders move, slowly tip your right / left ear to your shoulder until your feel a gentle stretch in the muscles on the opposite side of your neck. Hold 15-20 seconds. Repeat 2-3 times. Complete this exercise 1-2 times per day.  STRETCH - Cervical Rotators  Stand or sit on a firm surface. Assume a good posture: chest up, shoulders drawn back, abdominal muscles slightly tense, knees unlocked (if standing) and feet hip width apart. Keeping your eyes level with the ground, slowly turn your head until you feel a gentle stretch along the back and opposite side of your neck. Hold 15-20 seconds. Repeat 2-3 times. Complete this exercise 1-2 times per day.  RANGE OF MOTION - Neck Circles  Stand or sit on a firm surface. Assume a good posture: chest up, shoulders drawn back, abdominal muscles slightly tense, knees unlocked (if standing) and feet hip width apart. Gently roll your head down and around from the back of one shoulder to the back of the other. The motion  should never be forced or painful. Repeat the motion 10-20 times, or until you feel the neck muscles relax and loosen. Repeat 2-3 times. Complete the exercise 1-2 times per day. STRENGTHENING EXERCISES - Cervical Strain and Sprain These exercises may help you when beginning to rehabilitate your injury. They may resolve your symptoms with or without further involvement from your physician, physical  therapist, or athletic trainer. While completing these exercises, remember:  Muscles can gain both the endurance and the strength needed for everyday activities through controlled exercises. Complete these exercises as instructed by your physician, physical therapist, or athletic trainer. Progress the resistance and repetitions only as guided. You may experience muscle soreness or fatigue, but the pain or discomfort you are trying to eliminate should never worsen during these exercises. If this pain does worsen, stop and make certain you are following the directions exactly. If the pain is still present after adjustments, discontinue the exercise until you can discuss the trouble with your clinician.  STRENGTH - Cervical Flexors, Isometric Face a wall, standing about 6 inches away. Place a small pillow, a ball about 6-8 inches in diameter, or a folded towel between your forehead and the wall. Slightly tuck your chin and gently push your forehead into the soft object. Push only with mild to moderate intensity, building up tension gradually. Keep your jaw and forehead relaxed. Hold 10 to 20 seconds. Keep your breathing relaxed. Release the tension slowly. Relax your neck muscles completely before you start the next repetition. Repeat 2-3 times. Complete this exercise 1 time per day.  STRENGTH- Cervical Lateral Flexors, Isometric  Stand about 6 inches away from a wall. Place a small pillow, a ball about 6-8 inches in diameter, or a folded towel between the side of your head and the wall. Slightly tuck your chin and gently tilt your head into the soft object. Push only with mild to moderate intensity, building up tension gradually. Keep your jaw and forehead relaxed. Hold 10 to 20 seconds. Keep your breathing relaxed. Release the tension slowly. Relax your neck muscles completely before you start the next repetition. Repeat 2-3 times. Complete this exercise 1 time per day.  STRENGTH - Cervical  Extensors, Isometric  Stand about 6 inches away from a wall. Place a small pillow, a ball about 6-8 inches in diameter, or a folded towel between the back of your head and the wall. Slightly tuck your chin and gently tilt your head back into the soft object. Push only with mild to moderate intensity, building up tension gradually. Keep your jaw and forehead relaxed. Hold 10 to 20 seconds. Keep your breathing relaxed. Release the tension slowly. Relax your neck muscles completely before you start the next repetition. Repeat 2-3 times. Complete this exercise 1 time per day.  POSTURE AND BODY MECHANICS CONSIDERATIONS Keeping correct posture when sitting, standing or completing your activities will reduce the stress put on different body tissues, allowing injured tissues a chance to heal and limiting painful experiences. The following are general guidelines for improved posture. Your physician or physical therapist will provide you with any instructions specific to your needs. While reading these guidelines, remember: The exercises prescribed by your provider will help you have the flexibility and strength to maintain correct postures. The correct posture provides the optimal environment for your joints to work. All of your joints have less wear and tear when properly supported by a spine with good posture. This means you will experience a healthier, less painful body. Correct posture  must be practiced with all of your activities, especially prolonged sitting and standing. Correct posture is as important when doing repetitive low-stress activities (typing) as it is when doing a single heavy-load activity (lifting).  PROLONGED STANDING WHILE SLIGHTLY LEANING FORWARD When completing a task that requires you to lean forward while standing in one place for a long time, place either foot up on a stationary 2- to 4-inch high object to help maintain the best posture. When both feet are on the ground, the low back  tends to lose its slight inward curve. If this curve flattens (or becomes too large), then the back and your other joints will experience too much stress, fatigue more quickly, and can cause pain.   RESTING POSITIONS Consider which positions are most painful for you when choosing a resting position. If you have pain with flexion-based activities (sitting, bending, stooping, squatting), choose a position that allows you to rest in a less flexed posture. You would want to avoid curling into a fetal position on your side. If your pain worsens with extension-based activities (prolonged standing, working overhead), avoid resting in an extended position such as sleeping on your stomach. Most people will find more comfort when they rest with their spine in a more neutral position, neither too rounded nor too arched. Lying on a non-sagging bed on your side with a pillow between your knees, or on your back with a pillow under your knees will often provide some relief. Keep in mind, being in any one position for a prolonged period of time, no matter how correct your posture, can still lead to stiffness.  WALKING Walk with an upright posture. Your ears, shoulders, and hips should all line up. OFFICE WORK When working at a desk, create an environment that supports good, upright posture. Without extra support, muscles fatigue and lead to excessive strain on joints and other tissues.  CHAIR: A chair should be able to slide under your desk when your back makes contact with the back of the chair. This allows you to work closely. The chair's height should allow your eyes to be level with the upper part of your monitor and your hands to be slightly lower than your elbows. Body position: Your feet should make contact with the floor. If this is not possible, use a foot rest. Keep your ears over your shoulders. This will reduce stress on your neck and low back.  Trapezius stretches/exercises Do exercises exactly as  told by your health care provider and adjust them as directed. It is normal to feel mild stretching, pulling, tightness, or discomfort as you do these exercises, but you should stop right away if you feel sudden pain or your pain gets worse.   Stretching and range of motion exercises These exercises warm up your muscles and joints and improve the movement and flexibility of your shoulder. These exercises can also help to relieve pain, numbness, and tingling. If you are unable to do any of the following for any reason, do not further attempt to do it.   Exercise A: Flexion, standing     Stand and hold a broomstick, a cane, or a similar object. Place your hands a little more than shoulder-width apart on the object. Your left / right hand should be palm-up, and your other hand should be palm-down. Push the stick to raise your left / right arm out to your side and then over your head. Use your other hand to help move the stick. Stop when you  feel a stretch in your shoulder, or when you reach the angle that is recommended by your health care provider. Avoid shrugging your shoulder while you raise your arm. Keep your shoulder blade tucked down toward your spine. Hold for 30 seconds. Slowly return to the starting position. Repeat 2 times. Complete this exercise 3 times per week.  Exercise B: Abduction, supine     Lie on your back and hold a broomstick, a cane, or a similar object. Place your hands a little more than shoulder-width apart on the object. Your left / right hand should be palm-up, and your other hand should be palm-down. Push the stick to raise your left / right arm out to your side and then over your head. Use your other hand to help move the stick. Stop when you feel a stretch in your shoulder, or when you reach the angle that is recommended by your health care provider. Avoid shrugging your shoulder while you raise your arm. Keep your shoulder blade tucked down toward your spine. Hold  for 30 seconds. Slowly return to the starting position. Repeat 2 times. Complete this exercise 3 times per week.  Exercise C: Flexion, active-assisted     Lie on your back. You may bend your knees for comfort. Hold a broomstick, a cane, or a similar object. Place your hands about shoulder-width apart on the object. Your palms should face toward your feet. Raise the stick and move your arms over your head and behind your head, toward the floor. Use your healthy arm to help your left / right arm move farther. Stop when you feel a gentle stretch in your shoulder, or when you reach the angle where your health care provider tells you to stop. Hold for 30 seconds. Slowly return to the starting position. Repeat 2 times. Complete this exercise 3 times per week.  Exercise D: External rotation and abduction     Stand in a door frame with one of your feet slightly in front of the other. This is called a staggered stance. Choose one of the following positions as told by your health care provider: Place your hands and forearms on the door frame above your head. Place your hands and forearms on the door frame at the height of your head. Place your hands on the door frame at the height of your elbows. Slowly move your weight onto your front foot until you feel a stretch across your chest and in the front of your shoulders. Keep your head and chest upright and keep your abdominal muscles tight. Hold for 30 seconds. To release the stretch, shift your weight to your back foot. Repeat 2 times. Complete this stretch 3 times per week.  Strengthening exercises These exercises build strength and endurance in your shoulder. Endurance is the ability to use your muscles for a long time, even after your muscles get tired. Exercise E: Scapular depression and adduction  Sit on a stable chair. Support your arms in front of you with pillows, armrests, or a tabletop. Keep your elbows in line with the sides of your  body. Gently move your shoulder blades down toward your middle back. Relax the muscles on the tops of your shoulders and in the back of your neck. Hold for 3 seconds. Slowly release the tension and relax your muscles completely before doing this exercise again. Repeat for a total of 10 repetitions. After you have practiced this exercise, try doing the exercise without the arm support. Then, try the exercise  while standing instead of sitting. Repeat 2 times. Complete this exercise 3 times per week.  Exercise F: Shoulder abduction, isometric     Stand or sit about 4-6 inches (10-15 cm) from a wall with your left / right side facing the wall. Bend your left / right elbow and gently press your elbow against the wall. Increase the pressure slowly until you are pressing as hard as you can without shrugging your shoulder. Hold for 3 seconds. Slowly release the tension and relax your muscles completely. Repeat for a total of 10 repetitions. Repeat 2 times. Complete this exercise 3 times per week.  Exercise G: Shoulder flexion, isometric     Stand or sit about 4-6 inches (10-15 cm) away from a wall with your left / right side facing the wall. Keep your left / right elbow straight and gently press the top of your fist against the wall. Increase the pressure slowly until you are pressing as hard as you can without shrugging your shoulder. Hold for 10-15 seconds. Slowly release the tension and relax your muscles completely. Repeat for a total of 10 repetitions. Repeat 2 times. Complete this exercise 3 times per week.  Exercise H: Internal rotation     Sit in a stable chair without armrests, or stand. Secure an exercise band at your left / right side, at elbow height. Place a soft object, such as a folded towel or a small pillow, under your left / right upper arm so your elbow is a few inches (about 8 cm) away from your side. Hold the end of the exercise band so the band stretches. Keeping your  elbow pressed against the soft object under your arm, move your forearm across your body toward your abdomen. Keep your body steady so the movement is only coming from your shoulder. Hold for 3 seconds. Slowly return to the starting position. Repeat for a total of 10 repetitions. Repeat 2 times. Complete this exercise 3 times per week.  Exercise I: External rotation     Sit in a stable chair without armrests, or stand. Secure an exercise band at your left / right side, at elbow height. Place a soft object, such as a folded towel or a small pillow, under your left / right upper arm so your elbow is a few inches (about 8 cm) away from your side. Hold the end of the exercise band so the band stretches. Keeping your elbow pressed against the soft object under your arm, move your forearm out, away from your abdomen. Keep your body steady so the movement is only coming from your shoulder. Hold for 3 seconds. Slowly return to the starting position. Repeat for a total of 10 repetitions. Repeat 2 times. Complete this exercise 3 times per week. Exercise J: Shoulder extension  Sit in a stable chair without armrests, or stand. Secure an exercise band to a stable object in front of you so the band is at shoulder height. Hold one end of the exercise band in each hand. Your palms should face each other. Straighten your elbows and lift your hands up to shoulder height. Step back, away from the secured end of the exercise band, until the band stretches. Squeeze your shoulder blades together and pull your hands down to the sides of your thighs. Stop when your hands are straight down by your sides. Do not let your hands go behind your body. Hold for 3 seconds. Slowly return to the starting position. Repeat for a total of 10  repetitions. Repeat 2 times. Complete this exercise 3 times per week.  Exercise K: Shoulder extension, prone     Lie on your abdomen on a firm surface so your left / right arm hangs  over the edge. Hold a 5 lb weight in your hand so your palm faces in toward your body. Your arm should be straight. Squeeze your shoulder blade down toward the middle of your back. Slowly raise your arm behind you, up to the height of the surface that you are lying on. Keep your arm straight. Hold for 3 seconds. Slowly return to the starting position and relax your muscles. Repeat for a total of 10 repetitions. Repeat 2 times. Complete this exercise 3 times per week.   Exercise L: Horizontal abduction, prone  Lie on your abdomen on a firm surface so your left / right arm hangs over the edge. Hold a 5 lb weight in your hand so your palm faces toward your feet. Your arm should be straight. Squeeze your shoulder blade down toward the middle of your back. Bend your elbow so your hand moves up, until your elbow is bent to an L shape (90 degrees). With your elbow bent, slowly move your forearm forward and up. Raise your hand up to the height of the surface that you are lying on. Your upper arm should not move, and your elbow should stay bent. At the top of the movement, your palm should face the floor. Hold for 3 seconds. Slowly return to the starting position and relax your muscles. Repeat for a total of 10 repetitions. Repeat 2 times. Complete this exercise 3 times per week.  Exercise M: Horizontal abduction, standing  Sit on a stable chair, or stand. Secure an exercise band to a stable object in front of you so the band is at shoulder height. Hold one end of the exercise band in each hand. Straighten your elbows and lift your hands straight in front of you, up to shoulder height. Your palms should face down, toward the floor. Step back, away from the secured end of the exercise band, until the band stretches. Move your arms out to your sides, and keep your arms straight. Hold for 3 seconds. Slowly return to the starting position. Repeat for a total of 10 repetitions. Repeat 2 times.  Complete this exercise 3 times per week.  Exercise N: Scapular retraction and elevation  Sit on a stable chair, or stand. Secure an exercise band to a stable object in front of you so the band is at shoulder height. Hold one end of the exercise band in each hand. Your palms should face each other. Sit in a stable chair without armrests, or stand. Step back, away from the secured end of the exercise band, until the band stretches. Squeeze your shoulder blades together and lift your hands over your head. Keep your elbows straight. Hold for 3 seconds. Slowly return to the starting position. Repeat for a total of 10 repetitions. Repeat 2 times. Complete this exercise 3 times per week.  This information is not intended to replace advice given to you by your health care provider. Make sure you discuss any questions you have with your health care provider. Document Released: 10/29/2005 Document Revised: 07/05/2016 Document Reviewed: 09/15/2015 [employer] Interactive Patient Education  2017 [employer].

## 2024-12-04 ENCOUNTER — Other Ambulatory Visit: Payer: Self-pay | Admitting: Family Medicine

## 2024-12-08 ENCOUNTER — Ambulatory Visit: Payer: Self-pay | Admitting: Family Medicine

## 2024-12-08 ENCOUNTER — Ambulatory Visit (HOSPITAL_BASED_OUTPATIENT_CLINIC_OR_DEPARTMENT_OTHER)
Admission: RE | Admit: 2024-12-08 | Discharge: 2024-12-08 | Disposition: A | Discharge status: Home / Self Care | Source: Ambulatory Visit | Attending: Family Medicine | Admitting: Family Medicine

## 2024-12-08 DIAGNOSIS — R29810 Facial weakness: Secondary | ICD-10-CM | POA: Insufficient documentation

## 2024-12-08 MED ORDER — ATORVASTATIN CALCIUM 40 MG PO TABS: 40.0000 mg | ORAL_TABLET | Freq: Every day | ORAL | 3 refills | Status: AC

## 2024-12-18 ENCOUNTER — Telehealth: Payer: Self-pay | Admitting: Family Medicine

## 2024-12-18 NOTE — Telephone Encounter (Signed)
 Pt dropped off paper to be filled out by pcp. Please call pt when it is ready to be picked up. Left in pcps box

## 2025-01-11 ENCOUNTER — Other Ambulatory Visit

## 2025-10-14 ENCOUNTER — Inpatient Hospital Stay: Admitting: Medical Oncology

## 2025-10-14 ENCOUNTER — Inpatient Hospital Stay
